# Patient Record
Sex: Male | Born: 1945 | Race: White | Hispanic: No | Marital: Married | Smoking: Former smoker
Health system: Southern US, Community
[De-identification: ages and names within clinical notes are randomized; demographics above are authoritative.]

## PROBLEM LIST (undated history)

## (undated) DIAGNOSIS — R350 Frequency of micturition: Secondary | ICD-10-CM

## (undated) DIAGNOSIS — Z87442 Personal history of urinary calculi: Secondary | ICD-10-CM

## (undated) DIAGNOSIS — I6529 Occlusion and stenosis of unspecified carotid artery: Secondary | ICD-10-CM

## (undated) DIAGNOSIS — I639 Cerebral infarction, unspecified: Secondary | ICD-10-CM

## (undated) DIAGNOSIS — I350 Nonrheumatic aortic (valve) stenosis: Secondary | ICD-10-CM

## (undated) DIAGNOSIS — M1990 Unspecified osteoarthritis, unspecified site: Secondary | ICD-10-CM

## (undated) DIAGNOSIS — F419 Anxiety disorder, unspecified: Secondary | ICD-10-CM

## (undated) DIAGNOSIS — E7800 Pure hypercholesterolemia, unspecified: Secondary | ICD-10-CM

## (undated) DIAGNOSIS — K449 Diaphragmatic hernia without obstruction or gangrene: Secondary | ICD-10-CM

## (undated) DIAGNOSIS — T7840XA Allergy, unspecified, initial encounter: Secondary | ICD-10-CM

## (undated) DIAGNOSIS — K649 Unspecified hemorrhoids: Secondary | ICD-10-CM

## (undated) DIAGNOSIS — F329 Major depressive disorder, single episode, unspecified: Secondary | ICD-10-CM

## (undated) DIAGNOSIS — F32A Depression, unspecified: Secondary | ICD-10-CM

## (undated) DIAGNOSIS — R011 Cardiac murmur, unspecified: Secondary | ICD-10-CM

## (undated) DIAGNOSIS — Z5189 Encounter for other specified aftercare: Secondary | ICD-10-CM

## (undated) DIAGNOSIS — R112 Nausea with vomiting, unspecified: Secondary | ICD-10-CM

## (undated) DIAGNOSIS — R200 Anesthesia of skin: Secondary | ICD-10-CM

## (undated) DIAGNOSIS — Z952 Presence of prosthetic heart valve: Secondary | ICD-10-CM

## (undated) DIAGNOSIS — G459 Transient cerebral ischemic attack, unspecified: Secondary | ICD-10-CM

## (undated) DIAGNOSIS — T8859XA Other complications of anesthesia, initial encounter: Secondary | ICD-10-CM

## (undated) DIAGNOSIS — Z86010 Personal history of colonic polyps: Secondary | ICD-10-CM

## (undated) DIAGNOSIS — I2510 Atherosclerotic heart disease of native coronary artery without angina pectoris: Secondary | ICD-10-CM

## (undated) DIAGNOSIS — Z9889 Other specified postprocedural states: Secondary | ICD-10-CM

## (undated) DIAGNOSIS — R202 Paresthesia of skin: Secondary | ICD-10-CM

## (undated) HISTORY — PX: KNEE SURGERY: SHX244

## (undated) HISTORY — PX: POLYPECTOMY: SHX149

## (undated) HISTORY — DX: Allergy, unspecified, initial encounter: T78.40XA

## (undated) HISTORY — PX: BACK SURGERY: SHX140

## (undated) HISTORY — DX: Personal history of colonic polyps: Z86.010

## (undated) HISTORY — PX: COLONOSCOPY: SHX174

## (undated) HISTORY — DX: Encounter for other specified aftercare: Z51.89

## (undated) HISTORY — PX: TONSILLECTOMY: SUR1361

---

## 2013-11-09 DIAGNOSIS — I639 Cerebral infarction, unspecified: Secondary | ICD-10-CM

## 2013-11-09 HISTORY — DX: Cerebral infarction, unspecified: I63.9

## 2014-01-11 ENCOUNTER — Other Ambulatory Visit: Payer: Self-pay | Admitting: Neurosurgery

## 2014-01-11 DIAGNOSIS — M4712 Other spondylosis with myelopathy, cervical region: Secondary | ICD-10-CM

## 2014-01-22 ENCOUNTER — Ambulatory Visit
Admission: RE | Admit: 2014-01-22 | Discharge: 2014-01-22 | Disposition: A | Discharge status: Home / Self Care | Payer: Medicare Other | Source: Ambulatory Visit | Attending: Neurosurgery | Admitting: Neurosurgery

## 2014-01-22 DIAGNOSIS — M4712 Other spondylosis with myelopathy, cervical region: Secondary | ICD-10-CM

## 2014-02-08 ENCOUNTER — Other Ambulatory Visit: Payer: Self-pay | Admitting: Neurosurgery

## 2014-04-01 ENCOUNTER — Encounter (HOSPITAL_COMMUNITY): Payer: Self-pay

## 2014-04-01 ENCOUNTER — Ambulatory Visit (HOSPITAL_COMMUNITY)
Admission: RE | Admit: 2014-04-01 | Discharge: 2014-04-01 | Disposition: A | Discharge status: Home / Self Care | Payer: Medicare Other | Source: Ambulatory Visit | Attending: Neurosurgery | Admitting: Neurosurgery

## 2014-04-01 ENCOUNTER — Encounter (HOSPITAL_COMMUNITY)
Admission: RE | Admit: 2014-04-01 | Discharge: 2014-04-01 | Disposition: A | Discharge status: Home / Self Care | Payer: Medicare Other | Source: Ambulatory Visit | Attending: Neurosurgery | Admitting: Neurosurgery

## 2014-04-01 DIAGNOSIS — Z01818 Encounter for other preprocedural examination: Secondary | ICD-10-CM | POA: Diagnosis present

## 2014-04-01 HISTORY — DX: Diaphragmatic hernia without obstruction or gangrene: K44.9

## 2014-04-01 HISTORY — DX: Transient cerebral ischemic attack, unspecified: G45.9

## 2014-04-01 HISTORY — DX: Frequency of micturition: R35.0

## 2014-04-01 HISTORY — DX: Nausea with vomiting, unspecified: R11.2

## 2014-04-01 HISTORY — DX: Unspecified hemorrhoids: K64.9

## 2014-04-01 HISTORY — DX: Pure hypercholesterolemia, unspecified: E78.00

## 2014-04-01 HISTORY — DX: Unspecified osteoarthritis, unspecified site: M19.90

## 2014-04-01 HISTORY — DX: Other specified postprocedural states: Z98.890

## 2014-04-01 HISTORY — DX: Paresthesia of skin: R20.0

## 2014-04-01 HISTORY — DX: Anesthesia of skin: R20.2

## 2014-04-01 LAB — SURGICAL PCR SCREEN
MRSA, PCR: NEGATIVE
Staphylococcus aureus: NEGATIVE

## 2014-04-01 LAB — CBC
HCT: 42.9 % (ref 39.0–52.0)
Hemoglobin: 14.3 g/dL (ref 13.0–17.0)
MCH: 32.1 pg (ref 26.0–34.0)
MCHC: 33.3 g/dL (ref 30.0–36.0)
MCV: 96.4 fL (ref 78.0–100.0)
Platelets: 153 10*3/uL (ref 150–400)
RBC: 4.45 MIL/uL (ref 4.22–5.81)
RDW: 13.1 % (ref 11.5–15.5)
WBC: 5 10*3/uL (ref 4.0–10.5)

## 2014-04-01 LAB — BASIC METABOLIC PANEL
Anion gap: 10 (ref 5–15)
BUN: 30 mg/dL — ABNORMAL HIGH (ref 6–23)
CO2: 27 mEq/L (ref 19–32)
Calcium: 9.4 mg/dL (ref 8.4–10.5)
Chloride: 104 mEq/L (ref 96–112)
Creatinine, Ser: 0.8 mg/dL (ref 0.50–1.35)
GFR calc Af Amer: >90 mL/min (ref >90)
GFR calc non Af Amer: >90 mL/min (ref >90)
Glucose, Bld: 129 mg/dL — ABNORMAL HIGH (ref 70–99)
Potassium: 4.5 mEq/L (ref 3.7–5.3)
Sodium: 141 mEq/L (ref 137–147)

## 2014-04-01 NOTE — Pre-Procedure Instructions (Signed)
Nicholas Wise  04/01/2014   Your procedure is scheduled on:  Friday, September 25th  Report to [facility] Admitting at 530 AM.  Call this number if you have problems the morning of surgery: [phone]   Remember:   Do not eat food or drink liquids after midnight.   Take these medicines the morning of surgery with A SIP OF WATER: xanax if needed, flonase, flomax, proscar  Stop taking aspirin, OTC vitamins/herbal medications, NSAIDS (ibuprofen, advil, motrin) 7 days prior to surgery.   Do not wear jewelry.  Do not wear lotions, powders, or perfumes. You may wear deodorant.  Do not shave 48 hours prior to surgery. Men may shave face and neck.  Do not bring valuables to the hospital.  [facility] is not responsible  for any belongings or valuables.               Contacts, dentures or bridgework may not be worn into surgery.  Leave suitcase in the car. After surgery it may be brought to your room.  For patients admitted to the hospital, discharge time is determined by your treatment team.               Patients discharged the day of surgery will not be allowed to drive home.  Please read over the following fact sheets that you were given: Pain Booklet, Coughing and Deep Breathing, MRSA Information and Surgical Site Infection Prevention [facility] - Preparing for Surgery  Before surgery, you can play an important role.  Because skin is not sterile, your skin needs to be as free of germs as possible.  You can reduce the number of germs on you skin by washing with CHG (chlorahexidine gluconate) soap before surgery.  CHG is an antiseptic cleaner which kills germs and bonds with the skin to continue killing germs even after washing.  Please DO NOT use if you have an allergy to CHG or antibacterial soaps.  If your skin becomes reddened/irritated stop using the CHG and inform your nurse when you arrive at Short Stay.  Do not shave (including legs and underarms) for at least 48 hours  prior to the first CHG shower.  You may shave your face.  Please follow these instructions carefully:   1.  Shower with CHG Soap the night before surgery and the morning of Surgery.  2.  If you choose to wash your hair, wash your hair first as usual with your normal shampoo.  3.  After you shampoo, rinse your hair and body thoroughly to remove the shampoo.  4.  Use CHG as you would any other liquid soap.  You can apply CHG directly to the skin and wash gently with scrungie or a clean washcloth.  5.  Apply the CHG Soap to your body ONLY FROM THE NECK DOWN.  Do not use on open wounds or open sores.  Avoid contact with your eyes, ears, mouth and genitals (private parts).  Wash genitals (private parts) with your normal soap.  6.  Wash thoroughly, paying special attention to the area where your surgery will be performed.  7.  Thoroughly rinse your body with warm water from the neck down.  8.  DO NOT shower/wash with your normal soap after using and rinsing off the CHG Soap.  9.  Pat yourself dry with a clean towel.            10.  Wear clean pajamas.  11.  Place clean sheets on your bed the night of your first shower and do not sleep with pets.  Day of Surgery  Do not apply any lotions/deoderants the morning of surgery.  Please wear clean clothes to the hospital/surgery center.

## 2014-04-04 NOTE — Progress Notes (Signed)
Anesthesia PAT Evaluation:  Patient is a 68 year old male scheduled for C3-4, C4-5, C5-6 ACDF on 04/08/14 by Dr. Vertell Limber.  I evaluated him during his PAT visit on 04/01/14 due to history of TIA earlier this year.  History includes TIA 11/09/2013 (presented with transient left facial and LUE numbness and facial droop; evaluated at [facility] in [place], [place]), hypercholesterolemia, hiatal hernia, arthritis, postoperative nausea and vomiting, tonsillectomy. Smoking status was not documented.  He has smoked in the past. PCP is Dr. Macarthur Critchley with IM in [place].  Neurologist is Dr. Wendi Snipes in [place] who did clear patient to hold Plavix for surgery.  He is not currently being followed by a cardiologist, but had a remote history of a stress test (> 15 years) with Dr. Sabra Heck at [facility], and was told symptoms were musculoskeletal related. 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  Echo on 11/10/13 [facility]) showed:  Normal LV size, wall thickness, and systolic function with an EF estimated at 60%. Normal right ventricle size and systolic function. Grossly normal left ventricular filling pattern with indeterminate resting left ventricle filling pressures by tissue Doppler. Aortic valve is diffusely calcified and borderline stenosed. The maximum left ventricular outlet velocities recorded at about 2.2 m/s. Mitral, tricuspid, and pulmonic valves structurally grossly normal. Nonspecific thickening is observed of the intra-atrial septum, likely related to lipomatous infiltration. No pericardial effusion. No intracardiac mass/thrombus.  EKG on 11/09/13 [facility]) showed: SR, RSR in V1/2 consistent with RV conduction delay. EKG on 11/10/13 showed SB at 57 bpm, non-specific IVCD.  Carotid duplex on 11/10/13 [facility]) showed: Less than 50% bilateral ICA stenosis. Bilateral vertebral arteries demonstrate antegrade flow.  Szymczak CT w/o contrast on 11/09/13 showed: No acute intracranial findings. (He  said that he tried an out-patient MRI, but this had to be aborted due to claustrophobia.  He reports that at this time, Dr. Rob Hickman is not planning to re-order.)  Preoperative CXR and labs noted.    He denied any recurrent TIA/CVA symptoms.  No chest pain or SOB.  No LE edema.  Heart RRR, question of faint I/VI SEM. Lungs clear.  Good mouth opening.  Mild limitations with neck extension. We discussed possibility of need for specialized equipment for intubation such as glidescope.  He will discuss further with his assigned anesthesiologist on the day of surgery. If no acute changes then I anticipate that he can proceed.  George Hugh [facility] Short Stay Center/Anesthesiology Phone [phone] 04/04/2014 1:42 PM

## 2014-04-10 MED ORDER — CEFAZOLIN SODIUM-DEXTROSE 2-3 GM-% IV SOLR: 2.0000 g | INTRAVENOUS | Status: DC

## 2014-04-11 ENCOUNTER — Encounter (HOSPITAL_COMMUNITY): Payer: Self-pay | Admitting: Certified Registered Nurse Anesthetist

## 2014-04-11 ENCOUNTER — Encounter (HOSPITAL_COMMUNITY): Payer: Medicare Other | Admitting: Vascular Surgery

## 2014-04-11 ENCOUNTER — Inpatient Hospital Stay (HOSPITAL_COMMUNITY): Payer: Medicare Other

## 2014-04-11 ENCOUNTER — Encounter (HOSPITAL_COMMUNITY)
Admission: RE | Disposition: A | Discharge status: Home / Self Care | Payer: Self-pay | Source: Ambulatory Visit | Attending: Neurosurgery

## 2014-04-11 ENCOUNTER — Inpatient Hospital Stay (HOSPITAL_COMMUNITY)
Admission: RE | Admit: 2014-04-11 | Discharge: 2014-04-12 | DRG: 473 | Disposition: A | Discharge status: Home / Self Care | Payer: Medicare Other | Source: Ambulatory Visit | Attending: Neurosurgery | Admitting: Neurosurgery

## 2014-04-11 ENCOUNTER — Inpatient Hospital Stay (HOSPITAL_COMMUNITY): Payer: Medicare Other | Admitting: Anesthesiology

## 2014-04-11 DIAGNOSIS — Z7982 Long term (current) use of aspirin: Secondary | ICD-10-CM | POA: Diagnosis not present

## 2014-04-11 DIAGNOSIS — M4712 Other spondylosis with myelopathy, cervical region: Secondary | ICD-10-CM | POA: Diagnosis present

## 2014-04-11 DIAGNOSIS — Z833 Family history of diabetes mellitus: Secondary | ICD-10-CM

## 2014-04-11 DIAGNOSIS — M542 Cervicalgia: Secondary | ICD-10-CM | POA: Diagnosis present

## 2014-04-11 DIAGNOSIS — R338 Other retention of urine: Secondary | ICD-10-CM | POA: Diagnosis not present

## 2014-04-11 DIAGNOSIS — M5380 Other specified dorsopathies, site unspecified: Secondary | ICD-10-CM | POA: Diagnosis present

## 2014-04-11 DIAGNOSIS — G959 Disease of spinal cord, unspecified: Secondary | ICD-10-CM | POA: Diagnosis present

## 2014-04-11 DIAGNOSIS — M5412 Radiculopathy, cervical region: Secondary | ICD-10-CM | POA: Diagnosis present

## 2014-04-11 DIAGNOSIS — Z7902 Long term (current) use of antithrombotics/antiplatelets: Secondary | ICD-10-CM

## 2014-04-11 DIAGNOSIS — Z8249 Family history of ischemic heart disease and other diseases of the circulatory system: Secondary | ICD-10-CM | POA: Diagnosis not present

## 2014-04-11 HISTORY — PX: ANTERIOR CERVICAL DECOMP/DISCECTOMY FUSION: SHX1161

## 2014-04-11 SURGERY — ANTERIOR CERVICAL DECOMPRESSION/DISCECTOMY FUSION 3 LEVELS
Anesthesia: General

## 2014-04-11 MED ORDER — TAMSULOSIN HCL 0.4 MG PO CAPS
0.4000 mg | ORAL_CAPSULE | Freq: Two times a day (BID) | ORAL | Status: DC
  Administered 2014-04-11 – 2014-04-12 (×2): 0.4 mg via ORAL
  Filled 2014-04-11 (×4): qty 1

## 2014-04-11 MED ORDER — ALLOPURINOL 300 MG PO TABS
300.0000 mg | ORAL_TABLET | Freq: Every day | ORAL | Status: DC
  Administered 2014-04-11 – 2014-04-12 (×2): 300 mg via ORAL
  Filled 2014-04-11 (×2): qty 1

## 2014-04-11 MED ORDER — FENTANYL CITRATE 0.05 MG/ML IJ SOLN
INTRAMUSCULAR | Status: AC
  Filled 2014-04-11: qty 5

## 2014-04-11 MED ORDER — MORPHINE SULFATE 2 MG/ML IJ SOLN: 1.0000 mg | INTRAMUSCULAR | Status: DC | PRN

## 2014-04-11 MED ORDER — PROPOFOL 10 MG/ML IV BOLUS
INTRAVENOUS | Status: AC
  Filled 2014-04-11: qty 20

## 2014-04-11 MED ORDER — DEXTROSE 5 % IV SOLN
1.5000 g | INTRAVENOUS | Status: DC | PRN
  Administered 2014-04-11: 1.5 g via INTRAVENOUS

## 2014-04-11 MED ORDER — STERILE WATER FOR INJECTION IJ SOLN
INTRAMUSCULAR | Status: AC
  Filled 2014-04-11: qty 10

## 2014-04-11 MED ORDER — HYDROMORPHONE HCL 1 MG/ML IJ SOLN
INTRAMUSCULAR | Status: AC
  Filled 2014-04-11: qty 1

## 2014-04-11 MED ORDER — DOCUSATE SODIUM 100 MG PO CAPS
100.0000 mg | ORAL_CAPSULE | Freq: Two times a day (BID) | ORAL | Status: DC
  Administered 2014-04-11 – 2014-04-12 (×3): 100 mg via ORAL
  Filled 2014-04-11 (×4): qty 1

## 2014-04-11 MED ORDER — HYDROMORPHONE HCL 1 MG/ML IJ SOLN
0.2500 mg | INTRAMUSCULAR | Status: DC | PRN
  Administered 2014-04-11 (×4): 0.5 mg via INTRAVENOUS

## 2014-04-11 MED ORDER — DIPHENHYDRAMINE HCL 50 MG/ML IJ SOLN
INTRAMUSCULAR | Status: DC | PRN
  Administered 2014-04-11: 12.5 mg via INTRAVENOUS

## 2014-04-11 MED ORDER — BUPIVACAINE HCL (PF) 0.5 % IJ SOLN
INTRAMUSCULAR | Status: DC | PRN
  Administered 2014-04-11: 4 mL

## 2014-04-11 MED ORDER — ALPRAZOLAM 0.25 MG PO TABS: 0.2500 mg | ORAL_TABLET | Freq: Every day | ORAL | Status: DC | PRN

## 2014-04-11 MED ORDER — SENNA 8.6 MG PO TABS
1.0000 | ORAL_TABLET | Freq: Two times a day (BID) | ORAL | Status: DC
  Administered 2014-04-11 – 2014-04-12 (×2): 8.6 mg via ORAL
  Filled 2014-04-11 (×3): qty 1

## 2014-04-11 MED ORDER — MIDAZOLAM HCL 5 MG/5ML IJ SOLN
INTRAMUSCULAR | Status: DC | PRN
  Administered 2014-04-11: 2 mg via INTRAVENOUS

## 2014-04-11 MED ORDER — SUCCINYLCHOLINE CHLORIDE 20 MG/ML IJ SOLN
INTRAMUSCULAR | Status: AC
  Filled 2014-04-11: qty 1

## 2014-04-11 MED ORDER — LIDOCAINE-EPINEPHRINE 1 %-1:100000 IJ SOLN
INTRAMUSCULAR | Status: DC | PRN
  Administered 2014-04-11: 4 mL

## 2014-04-11 MED ORDER — ACETAMINOPHEN 325 MG PO TABS: 650.0000 mg | ORAL_TABLET | ORAL | Status: DC | PRN

## 2014-04-11 MED ORDER — SODIUM CHLORIDE 0.9 % IJ SOLN: 3.0000 mL | INTRAMUSCULAR | Status: DC | PRN

## 2014-04-11 MED ORDER — SODIUM CHLORIDE 0.9 % IJ SOLN
3.0000 mL | Freq: Two times a day (BID) | INTRAMUSCULAR | Status: DC
  Administered 2014-04-11 (×2): 3 mL via INTRAVENOUS

## 2014-04-11 MED ORDER — FLEET ENEMA 7-19 GM/118ML RE ENEM
1.0000 | ENEMA | Freq: Once | RECTAL | Status: AC | PRN
  Filled 2014-04-11: qty 1

## 2014-04-11 MED ORDER — ARTIFICIAL TEARS OP OINT
TOPICAL_OINTMENT | OPHTHALMIC | Status: AC
  Filled 2014-04-11: qty 3.5

## 2014-04-11 MED ORDER — NEOSTIGMINE METHYLSULFATE 10 MG/10ML IV SOLN
INTRAVENOUS | Status: DC | PRN
  Administered 2014-04-11: 4 mg via INTRAVENOUS

## 2014-04-11 MED ORDER — PHENOL 1.4 % MT LIQD: 1.0000 | OROMUCOSAL | Status: DC | PRN

## 2014-04-11 MED ORDER — ROCURONIUM BROMIDE 100 MG/10ML IV SOLN
INTRAVENOUS | Status: DC | PRN
  Administered 2014-04-11: 50 mg via INTRAVENOUS

## 2014-04-11 MED ORDER — MENTHOL 3 MG MT LOZG
1.0000 | LOZENGE | OROMUCOSAL | Status: DC | PRN
  Filled 2014-04-11: qty 9

## 2014-04-11 MED ORDER — ZOLPIDEM TARTRATE 5 MG PO TABS: 5.0000 mg | ORAL_TABLET | Freq: Every evening | ORAL | Status: DC | PRN

## 2014-04-11 MED ORDER — VECURONIUM BROMIDE 10 MG IV SOLR
INTRAVENOUS | Status: AC
  Filled 2014-04-11: qty 10

## 2014-04-11 MED ORDER — FINASTERIDE 5 MG PO TABS
5.0000 mg | ORAL_TABLET | Freq: Every day | ORAL | Status: DC
  Administered 2014-04-12: 5 mg via ORAL
  Filled 2014-04-11 (×2): qty 1

## 2014-04-11 MED ORDER — THROMBIN 20000 UNITS EX SOLR
CUTANEOUS | Status: DC | PRN
  Administered 2014-04-11: 08:00:00 via TOPICAL

## 2014-04-11 MED ORDER — ATORVASTATIN CALCIUM 40 MG PO TABS
40.0000 mg | ORAL_TABLET | Freq: Every day | ORAL | Status: DC
  Administered 2014-04-11 – 2014-04-12 (×2): 40 mg via ORAL
  Filled 2014-04-11 (×2): qty 1

## 2014-04-11 MED ORDER — PANTOPRAZOLE SODIUM 40 MG IV SOLR
40.0000 mg | Freq: Every day | INTRAVENOUS | Status: DC
  Administered 2014-04-11: 40 mg via INTRAVENOUS
  Filled 2014-04-11 (×2): qty 40

## 2014-04-11 MED ORDER — PHENYLEPHRINE 40 MCG/ML (10ML) SYRINGE FOR IV PUSH (FOR BLOOD PRESSURE SUPPORT)
PREFILLED_SYRINGE | INTRAVENOUS | Status: AC
  Filled 2014-04-11: qty 10

## 2014-04-11 MED ORDER — ASPIRIN EC 81 MG PO TBEC
81.0000 mg | DELAYED_RELEASE_TABLET | Freq: Every day | ORAL | Status: DC
  Administered 2014-04-11 – 2014-04-12 (×2): 81 mg via ORAL
  Filled 2014-04-11 (×2): qty 1

## 2014-04-11 MED ORDER — LACTATED RINGERS IV SOLN
INTRAVENOUS | Status: DC | PRN
  Administered 2014-04-11 (×2): via INTRAVENOUS

## 2014-04-11 MED ORDER — CEFAZOLIN SODIUM 1-5 GM-% IV SOLN
1.0000 g | Freq: Three times a day (TID) | INTRAVENOUS | Status: AC
  Administered 2014-04-11 (×2): 1 g via INTRAVENOUS
  Filled 2014-04-11 (×2): qty 50

## 2014-04-11 MED ORDER — FENTANYL CITRATE 0.05 MG/ML IJ SOLN
INTRAMUSCULAR | Status: DC | PRN
  Administered 2014-04-11: 200 ug via INTRAVENOUS
  Administered 2014-04-11: 50 ug via INTRAVENOUS

## 2014-04-11 MED ORDER — OXYCODONE-ACETAMINOPHEN 5-325 MG PO TABS: 1.0000 | ORAL_TABLET | ORAL | Status: DC | PRN

## 2014-04-11 MED ORDER — HYDROCODONE-ACETAMINOPHEN 5-325 MG PO TABS
1.0000 | ORAL_TABLET | ORAL | Status: DC | PRN
  Administered 2014-04-11 (×2): 1 via ORAL
  Administered 2014-04-12 (×2): 2 via ORAL
  Filled 2014-04-11: qty 1
  Filled 2014-04-11 (×2): qty 2
  Filled 2014-04-11: qty 1

## 2014-04-11 MED ORDER — 0.9 % SODIUM CHLORIDE (POUR BTL) OPTIME
TOPICAL | Status: DC | PRN
  Administered 2014-04-11: 1000 mL

## 2014-04-11 MED ORDER — EPHEDRINE SULFATE 50 MG/ML IJ SOLN
INTRAMUSCULAR | Status: AC
  Filled 2014-04-11: qty 1

## 2014-04-11 MED ORDER — KCL IN DEXTROSE-NACL 20-5-0.45 MEQ/L-%-% IV SOLN
INTRAVENOUS | Status: DC
  Filled 2014-04-11 (×3): qty 1000

## 2014-04-11 MED ORDER — DIAZEPAM 5 MG PO TABS
ORAL_TABLET | ORAL | Status: AC
  Filled 2014-04-11: qty 1

## 2014-04-11 MED ORDER — ONDANSETRON HCL 4 MG/2ML IJ SOLN
4.0000 mg | INTRAMUSCULAR | Status: DC | PRN
  Administered 2014-04-11: 4 mg via INTRAVENOUS
  Filled 2014-04-11: qty 2

## 2014-04-11 MED ORDER — ONDANSETRON HCL 4 MG/2ML IJ SOLN
INTRAMUSCULAR | Status: DC | PRN
  Administered 2014-04-11: 4 mg via INTRAVENOUS

## 2014-04-11 MED ORDER — OXYCODONE HCL 5 MG/5ML PO SOLN: 5.0000 mg | Freq: Once | ORAL | Status: AC | PRN

## 2014-04-11 MED ORDER — BISACODYL 10 MG RE SUPP: 10.0000 mg | Freq: Every day | RECTAL | Status: DC | PRN

## 2014-04-11 MED ORDER — NEOSTIGMINE METHYLSULFATE 10 MG/10ML IV SOLN
INTRAVENOUS | Status: AC
  Filled 2014-04-11: qty 1

## 2014-04-11 MED ORDER — LORATADINE 10 MG PO TABS
10.0000 mg | ORAL_TABLET | Freq: Every day | ORAL | Status: DC
  Administered 2014-04-11 – 2014-04-12 (×2): 10 mg via ORAL
  Filled 2014-04-11 (×2): qty 1

## 2014-04-11 MED ORDER — OXYCODONE HCL 5 MG PO TABS
ORAL_TABLET | ORAL | Status: AC
  Filled 2014-04-11: qty 1

## 2014-04-11 MED ORDER — GLYCOPYRROLATE 0.2 MG/ML IJ SOLN
INTRAMUSCULAR | Status: DC | PRN
  Administered 2014-04-11: 0.6 mg via INTRAVENOUS

## 2014-04-11 MED ORDER — ALUM & MAG HYDROXIDE-SIMETH 200-200-20 MG/5ML PO SUSP: 30.0000 mL | Freq: Four times a day (QID) | ORAL | Status: DC | PRN

## 2014-04-11 MED ORDER — MIDAZOLAM HCL 2 MG/2ML IJ SOLN
INTRAMUSCULAR | Status: AC
  Filled 2014-04-11: qty 2

## 2014-04-11 MED ORDER — PROPOFOL 10 MG/ML IV BOLUS
INTRAVENOUS | Status: DC | PRN
  Administered 2014-04-11: 170 mg via INTRAVENOUS

## 2014-04-11 MED ORDER — SENNOSIDES-DOCUSATE SODIUM 8.6-50 MG PO TABS
1.0000 | ORAL_TABLET | Freq: Every evening | ORAL | Status: DC | PRN
  Filled 2014-04-11: qty 1

## 2014-04-11 MED ORDER — SCOPOLAMINE 1 MG/3DAYS TD PT72
MEDICATED_PATCH | TRANSDERMAL | Status: DC | PRN
  Administered 2014-04-11: 1 via TRANSDERMAL

## 2014-04-11 MED ORDER — DEXAMETHASONE SODIUM PHOSPHATE 4 MG/ML IJ SOLN
INTRAMUSCULAR | Status: DC | PRN
  Administered 2014-04-11: 4 mg via INTRAVENOUS
  Administered 2014-04-11: 6 mg via INTRAVENOUS

## 2014-04-11 MED ORDER — ACETAMINOPHEN 650 MG RE SUPP: 650.0000 mg | RECTAL | Status: DC | PRN

## 2014-04-11 MED ORDER — PHENYLEPHRINE HCL 10 MG/ML IJ SOLN
INTRAMUSCULAR | Status: DC | PRN
  Administered 2014-04-11 (×2): 80 ug via INTRAVENOUS

## 2014-04-11 MED ORDER — METOCLOPRAMIDE HCL 5 MG/ML IJ SOLN: 10.0000 mg | Freq: Once | INTRAMUSCULAR | Status: DC | PRN

## 2014-04-11 MED ORDER — FLUTICASONE PROPIONATE 50 MCG/ACT NA SUSP
1.0000 | Freq: Two times a day (BID) | NASAL | Status: DC
  Filled 2014-04-11: qty 16

## 2014-04-11 MED ORDER — ONDANSETRON HCL 4 MG/2ML IJ SOLN
INTRAMUSCULAR | Status: AC
  Filled 2014-04-11: qty 2

## 2014-04-11 MED ORDER — POTASSIUM CITRATE ER 10 MEQ (1080 MG) PO TBCR
10.0000 meq | EXTENDED_RELEASE_TABLET | Freq: Two times a day (BID) | ORAL | Status: DC
  Administered 2014-04-11 – 2014-04-12 (×3): 10 meq via ORAL
  Filled 2014-04-11 (×5): qty 1

## 2014-04-11 MED ORDER — GLYCOPYRROLATE 0.2 MG/ML IJ SOLN
INTRAMUSCULAR | Status: AC
  Filled 2014-04-11: qty 3

## 2014-04-11 MED ORDER — ROCURONIUM BROMIDE 50 MG/5ML IV SOLN
INTRAVENOUS | Status: AC
  Filled 2014-04-11: qty 1

## 2014-04-11 MED ORDER — LIDOCAINE HCL (CARDIAC) 20 MG/ML IV SOLN
INTRAVENOUS | Status: DC | PRN
  Administered 2014-04-11: 60 mg via INTRATRACHEAL
  Administered 2014-04-11: 40 mg via INTRAVENOUS

## 2014-04-11 MED ORDER — OXYCODONE HCL 5 MG PO TABS
5.0000 mg | ORAL_TABLET | Freq: Once | ORAL | Status: AC | PRN
  Administered 2014-04-11: 5 mg via ORAL

## 2014-04-11 MED ORDER — EPHEDRINE SULFATE 50 MG/ML IJ SOLN
INTRAMUSCULAR | Status: DC | PRN
  Administered 2014-04-11 (×2): 10 mg via INTRAVENOUS

## 2014-04-11 MED ORDER — LIDOCAINE HCL (CARDIAC) 20 MG/ML IV SOLN
INTRAVENOUS | Status: AC
  Filled 2014-04-11: qty 10

## 2014-04-11 MED ORDER — DIAZEPAM 5 MG PO TABS
5.0000 mg | ORAL_TABLET | Freq: Four times a day (QID) | ORAL | Status: DC | PRN
  Administered 2014-04-11 – 2014-04-12 (×3): 5 mg via ORAL
  Filled 2014-04-11 (×2): qty 1

## 2014-04-11 MED ORDER — CLOPIDOGREL BISULFATE 75 MG PO TABS
75.0000 mg | ORAL_TABLET | Freq: Every day | ORAL | Status: DC
  Filled 2014-04-11: qty 1

## 2014-04-11 SURGICAL SUPPLY — 70 items
ALLOGRAFT LORDOTIC CC 7X11X14 (Bone Implant) ×4 IMPLANT
ALLOGRAFT TRIAD LORDOTIC CC (Bone Implant) ×2 IMPLANT
BAG DECANTER FOR FLEXI CONT (MISCELLANEOUS) ×2 IMPLANT
BENZOIN TINCTURE PRP APPL 2/3 (GAUZE/BANDAGES/DRESSINGS) IMPLANT
BIT DRILL NEURO 2X3.1 SFT TUCH (MISCELLANEOUS) ×1 IMPLANT
BIT DRILL POWER (BIT) ×1 IMPLANT
BLADE ULTRA TIP 2M (BLADE) IMPLANT
BNDG GAUZE ELAST 4 BULKY (GAUZE/BANDAGES/DRESSINGS) IMPLANT
BUR BARREL STRAIGHT FLUTE 4.0 (BURR) ×4 IMPLANT
CANISTER SUCT 3000ML (MISCELLANEOUS) ×2 IMPLANT
CONT SPEC 4OZ CLIKSEAL STRL BL (MISCELLANEOUS) ×2 IMPLANT
COVER MAYO STAND STRL (DRAPES) ×2 IMPLANT
DERMABOND ADVANCED (GAUZE/BANDAGES/DRESSINGS) ×1
DERMABOND ADVANCED .7 DNX12 (GAUZE/BANDAGES/DRESSINGS) ×1 IMPLANT
DRAPE LAPAROTOMY 100X72 PEDS (DRAPES) ×2 IMPLANT
DRAPE MICROSCOPE LEICA (MISCELLANEOUS) ×2 IMPLANT
DRAPE POUCH INSTRU U-SHP 10X18 (DRAPES) ×2 IMPLANT
DRAPE PROXIMA HALF (DRAPES) IMPLANT
DRILL BIT POWER (BIT) ×1
DRILL NEURO 2X3.1 SOFT TOUCH (MISCELLANEOUS) ×2
DRSG OPSITE POSTOP 4X6 (GAUZE/BANDAGES/DRESSINGS) ×2 IMPLANT
DRSG TELFA 3X8 NADH (GAUZE/BANDAGES/DRESSINGS) IMPLANT
DURAPREP 6ML APPLICATOR 50/CS (WOUND CARE) ×2 IMPLANT
ELECT COATED BLADE 2.86 ST (ELECTRODE) ×2 IMPLANT
ELECT REM PT RETURN 9FT ADLT (ELECTROSURGICAL) ×2
ELECTRODE REM PT RTRN 9FT ADLT (ELECTROSURGICAL) ×1 IMPLANT
GAUZE SPONGE 4X4 12PLY STRL (GAUZE/BANDAGES/DRESSINGS) IMPLANT
GAUZE SPONGE 4X4 16PLY XRAY LF (GAUZE/BANDAGES/DRESSINGS) IMPLANT
GLOVE BIO SURGEON STRL SZ8 (GLOVE) ×2 IMPLANT
GLOVE BIOGEL PI IND STRL 8 (GLOVE) ×1 IMPLANT
GLOVE BIOGEL PI IND STRL 8.5 (GLOVE) ×1 IMPLANT
GLOVE BIOGEL PI INDICATOR 8 (GLOVE) ×1
GLOVE BIOGEL PI INDICATOR 8.5 (GLOVE) ×1
GLOVE ECLIPSE 8.0 STRL XLNG CF (GLOVE) ×2 IMPLANT
GLOVE EXAM NITRILE LRG STRL (GLOVE) ×4 IMPLANT
GLOVE EXAM NITRILE MD LF STRL (GLOVE) IMPLANT
GLOVE EXAM NITRILE XL STR (GLOVE) IMPLANT
GLOVE EXAM NITRILE XS STR PU (GLOVE) IMPLANT
GLOVE INDICATOR 7.0 STRL GRN (GLOVE) ×2 IMPLANT
GLOVE OPTIFIT SS 6.0 STRL BRWN (GLOVE) ×6 IMPLANT
GOWN STRL REUS W/ TWL LRG LVL3 (GOWN DISPOSABLE) ×1 IMPLANT
GOWN STRL REUS W/ TWL XL LVL3 (GOWN DISPOSABLE) ×2 IMPLANT
GOWN STRL REUS W/TWL 2XL LVL3 (GOWN DISPOSABLE) ×2 IMPLANT
GOWN STRL REUS W/TWL LRG LVL3 (GOWN DISPOSABLE) ×1
GOWN STRL REUS W/TWL XL LVL3 (GOWN DISPOSABLE) ×2
HALTER HD/CHIN CERV TRACTION D (MISCELLANEOUS) ×2 IMPLANT
KIT BASIN OR (CUSTOM PROCEDURE TRAY) ×2 IMPLANT
KIT ROOM TURNOVER OR (KITS) ×2 IMPLANT
NEEDLE HYPO 25X1 1.5 SAFETY (NEEDLE) ×2 IMPLANT
NEEDLE SPNL 18GX3.5 QUINCKE PK (NEEDLE) ×2 IMPLANT
NEEDLE SPNL 22GX3.5 QUINCKE BK (NEEDLE) ×2 IMPLANT
NS IRRIG 1000ML POUR BTL (IV SOLUTION) ×2 IMPLANT
PACK LAMINECTOMY NEURO (CUSTOM PROCEDURE TRAY) ×2 IMPLANT
PAD ARMBOARD 7.5X6 YLW CONV (MISCELLANEOUS) ×6 IMPLANT
PIN DISTRACTION 14MM (PIN) ×4 IMPLANT
PLATE ARCHON 3-LEVEL 60MM (Plate) ×2 IMPLANT
RUBBERBAND STERILE (MISCELLANEOUS) ×4 IMPLANT
SCREW ARCHON SELFTAP 4.0X13 (Screw) ×16 IMPLANT
SPONGE INTESTINAL PEANUT (DISPOSABLE) ×2 IMPLANT
SPONGE SURGIFOAM ABS GEL 100 (HEMOSTASIS) ×2 IMPLANT
STAPLER SKIN PROX WIDE 3.9 (STAPLE) ×2 IMPLANT
STRIP CLOSURE SKIN 1/2X4 (GAUZE/BANDAGES/DRESSINGS) IMPLANT
SUT VIC AB 3-0 SH 8-18 (SUTURE) ×2 IMPLANT
SYR 20ML ECCENTRIC (SYRINGE) ×2 IMPLANT
SYR 5ML LL (SYRINGE) IMPLANT
TOWEL OR 17X24 6PK STRL BLUE (TOWEL DISPOSABLE) ×2 IMPLANT
TOWEL OR 17X26 10 PK STRL BLUE (TOWEL DISPOSABLE) ×2 IMPLANT
TRAP SPECIMEN MUCOUS 40CC (MISCELLANEOUS) IMPLANT
TRAY FOLEY CATH 14FRSI W/METER (CATHETERS) IMPLANT
WATER STERILE IRR 1000ML POUR (IV SOLUTION) ×2 IMPLANT

## 2014-04-11 NOTE — Progress Notes (Signed)
Awake, alert, conversant.  MAEW with good strength bilateral deltoids, biceps, triceps, hand intrinsics.  Sore in neck, otherwise no complaints.

## 2014-04-11 NOTE — Op Note (Signed)
04/11/2014  10:21 AM  PATIENT:  Nicholas Wise  68 y.o. 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  PROCEDURE:  Procedure(s): ANTERIOR CERVICAL DECOMPRESSION/DISCECTOMY FUSION CERVICAL THREE-FOUR,CERVICAL FOUR-FIVE,CERVICAL FIVE-SIX (N/A) with allograft, autograft, plate  SURGEON:  Surgeon(s) and Role:    * Erline Levine, MD - Primary    * Newman Pies, MD - Assisting  PHYSICIAN ASSISTANT:   ASSISTANTS: Poteat, RN   ANESTHESIA:   general  EBL:  Total I/O In: 1000 [I.V.:1000] Out: 50 [Blood:50]  BLOOD ADMINISTERED:none  DRAINS: none   LOCAL MEDICATIONS USED:  LIDOCAINE   SPECIMEN:  No Specimen  DISPOSITION OF SPECIMEN:  N/A  COUNTS:  YES  TOURNIQUET:  * No tourniquets in log *  DICTATION: Patient was brought to operating room and following the smooth and uncomplicated induction of general endotracheal anesthesia his Lambert was placed on a horseshoe Millette holder he was placed in 5 pounds of Holter traction and his anterior neck was prepped and draped in usual sterile fashion. An incision was made on the left side of midline after infiltrating the skin and subcutaneous tissues with local lidocaine. The platysmal layer was incised and subplatysmal dissection was performed exposing the anterior border sternocleidomastoid muscle. Using blunt dissection the carotid sheath was kept lateral and trachea and esophagus kept medial exposing the anterior cervical spine. A bent spinal needle was placed it was felt to be the C4-5  level and this was confirmed on intraoperative x-ray. Longus coli muscles were taken down from the anterior cervical spine using electrocautery and key elevator and self-retaining retractor was placed. The interspace at C 34 was incised  and a thorough discectomy was performed. Distraction pins were placed. Uncinate spurs and central spondylitic ridges were drilled down with a high-speed drill. The spinal cord dura and both C 4 nerve roots were widely decompressed. Hemostasis was assured. After trial sizing a 7 mm machined lordotic cortico-cancellous allograft was selected and packed with local autograft. The graft was tamped into position and countersunk appropriately. The retractor was moved and the interspace at C 45 was incised and a thorough discectomy was performed. A large osteophyte was removed on the right with decompression of the right C 5 nerve root.  Distraction pins were placed. Uncinate spurs and central spondylitic ridges were drilled down with a high-speed drill. The spinal cord dura and both C5 nerve roots were widely decompressed. Hemostasis was assured. After trial sizing a 6 mm similar sized graft was selected, packed and inserted in the interspace in a similar fashion. The interspace at C 56 was incised and a thorough discectomy was performed. Distraction pins were placed. Uncinate spurs and central spondylitic ridges were drilled down with a high-speed drill. The spinal cord dura and both C 6 nerve roots were widely decompressed. A large osteophyte was removed on the right with decompression of the right C 6 nerve root. Hemostasis was assured. After trial sizing a 7 mm similar graft was prepared, packed and inserted in a similar fashion.  Distraction weight was removed. A 60 mm Nuvasive Archon anterior cervical plate was affixed to the cervical spine with 13 mm variable-angle screws 2 at C3, 2 at C4, 2 at C5,  and 2 at C6. All screws were well-positioned and locking mechanisms were engaged. Soft tissues were  inspected and found to be in good repair. The wound was irrigated. A final x-ray was obtained with good visualization at all operated levels with the interbody grafts well visualized. The platysma layer was closed with 3-0  Vicryl stitches and the skin was reapproximated with 3-0 Vicryl subcuticular stitches. The wound was dressed with Dermabond and an occlusive dressing. Counts were correct at the end of the case. Patient was extubated and taken to recovery in stable and satisfactory condition.    PLAN OF CARE: Admit to inpatient   PATIENT DISPOSITION:  PACU - hemodynamically stable.   Delay start of Pharmacological VTE agent (>24hrs) due to surgical blood loss or risk of bleeding: yes

## 2014-04-11 NOTE — Anesthesia Preprocedure Evaluation (Signed)
Anesthesia Evaluation  Patient identified by MRN, date of birth, ID band Patient awake    Reviewed: Allergy & Precautions, H&P , NPO status , Patient's Chart, lab work & pertinent test results  History of Anesthesia Complications (+) history of anesthetic complications  Airway Mallampati: II TM Distance: >3 FB Neck ROM: Limited    Dental  (+) Teeth Intact, Dental Advisory Given   Pulmonary  breath sounds clear to auscultation        Cardiovascular Rhythm:Regular Rate:Normal     Neuro/Psych    GI/Hepatic   Endo/Other    Renal/GU      Musculoskeletal   Abdominal   Peds  Hematology   Anesthesia Other Findings   Reproductive/Obstetrics                           Anesthesia Physical Anesthesia Plan  ASA: III  Anesthesia Plan: General   Post-op Pain Management:    Induction: Intravenous  Airway Management Planned: Oral ETT  Additional Equipment:   Intra-op Plan:   Post-operative Plan: Extubation in OR  Informed Consent: I have reviewed the patients History and Physical, chart, labs and discussed the procedure including the risks, benefits and alternatives for the proposed anesthesia with the patient or authorized representative who has indicated his/her understanding and acceptance.   Dental advisory given  Plan Discussed with: CRNA and Anesthesiologist  Anesthesia Plan Comments: (Cervical spondylosis H/O TIA 11/09/2013 No residual Carotid dopplers, ECHO (-) Off plavix X 10 days H/O Post-op N/V  Plan GA with oral ETT  Roberts Gaudy, MD)        Anesthesia Quick Evaluation

## 2014-04-11 NOTE — Anesthesia Procedure Notes (Signed)
Procedure Name: Intubation Date/Time: 04/11/2014 7:57 AM Performed by: Maryland Pink Pre-anesthesia Checklist: Patient identified, Emergency Drugs available, Suction available, Patient being monitored and Timeout performed Patient Re-evaluated:Patient Re-evaluated prior to inductionOxygen Delivery Method: Circle system utilized Preoxygenation: Pre-oxygenation with 100% oxygen Intubation Type: IV induction Ventilation: Mask ventilation without difficulty and Oral airway inserted - appropriate to patient size Laryngoscope Size: Mac and 3 Grade View: Grade I Tube type: Oral Tube size: 7.5 mm Number of attempts: 1 Airway Equipment and Method: LTA kit utilized and Stylet Placement Confirmation: ETT inserted through vocal cords under direct vision,  positive ETCO2 and breath sounds checked- equal and bilateral Secured at: 22 cm Tube secured with: Tape Dental Injury: Teeth and Oropharynx as per pre-operative assessment

## 2014-04-11 NOTE — Plan of Care (Signed)
Problem: Consults Goal: Diagnosis - Spinal Surgery Outcome: Completed/Met Date Met:  04/11/14 Cervical Spine Fusion     

## 2014-04-11 NOTE — Progress Notes (Signed)
Utilization review completed.  

## 2014-04-11 NOTE — Anesthesia Postprocedure Evaluation (Signed)
  Anesthesia Post-op Note  Patient: Nicholas Wise  Procedure(s) Performed: Procedure(s): ANTERIOR CERVICAL DECOMPRESSION/DISCECTOMY FUSION CERVICAL THREE-FOUR,CERVICAL FOUR-FIVE,CERVICAL FIVE-SIX (N/A)  Patient Location: PACU  Anesthesia Type:General  Level of Consciousness: awake, alert  and oriented  Airway and Oxygen Therapy: Patient Spontanous Breathing and Patient connected to nasal cannula oxygen  Post-op Pain: mild  Post-op Assessment: Post-op Vital signs reviewed, Patient's Cardiovascular Status Stable, Respiratory Function Stable, Patent Airway, No signs of Nausea or vomiting and Pain level controlled  Post-op Vital Signs: stable  Last Vitals:  Filed Vitals:   04/11/14 1140  BP: 138/72  Pulse: 72  Temp: 36.6 C  Resp: 18    Complications: No apparent anesthesia complications

## 2014-04-11 NOTE — Transfer of Care (Signed)
Immediate Anesthesia Transfer of Care Note  Patient: Nicholas Wise  Procedure(s) Performed: Procedure(s): ANTERIOR CERVICAL DECOMPRESSION/DISCECTOMY FUSION CERVICAL THREE-FOUR,CERVICAL FOUR-FIVE,CERVICAL FIVE-SIX (N/A)  Patient Location: PACU  Anesthesia Type:General  Level of Consciousness: awake, alert  and oriented  Airway & Oxygen Therapy: Patient Spontanous Breathing and Patient connected to nasal cannula oxygen  Post-op Assessment: Report given to PACU RN, Post -op Vital signs reviewed and stable and Patient moving all extremities X 4  Post vital signs: Reviewed and stable  Complications: No apparent anesthesia complications

## 2014-04-11 NOTE — Progress Notes (Signed)
Pt with decreased t waveson EKG,Dr Joslin made aware

## 2014-04-11 NOTE — H&P (Signed)
[place] [place], [place] [address] Phone: [phone]   Patient ID:   [id number] Patient: Nicholas Wise  Date of Birth: 1945-12-17 Visit Type: Office Visit   Date: 02/07/2014 03:45 PM Provider: Marchia Meiers. Vertell Limber MD   This 69 year old male presents for Follow Up of neck pain and Follow Up of numbness.  History of Present Illness: 1.  Follow Up of neck pain  Patient continues to have upper extremity numbness and bilateral hand intrinsic weakness.  Cervical MRI shows cervical stenosis at C3 C4, C4 C5, C5 C6 levels.  I have reviewed the studies and have recommended to the patient based on his complaints of hand numbness weakness as well as hyperreflexia with moderately severe spinal stenosis that he undergo anterior cervical decompression fusion C3 C4, C4 C5, C5 C6 levels.  Patient wishes to proceed with surgery.  He will need to come off Plavix which she has a fairly soft indication and this will occur in October to be fitted for a soft cervical collar and plan to proceed with surgery at that point.  Patient has mild degenerative changes at the C6 C7 level but I do not believe these are of significance.  He has most significant stenosis at C4 C5 but also is infolding of ligament at C3 C4 causing moderate stenosis at this level as well.  The C5 C6 level has significant stenosis and bilateral foraminal stenosis.    2.  Follow Up of numbness    Medical/Surgical/Interim History Reviewed, no change.  Last detailed document date:01/03/2014.   PAST MEDICAL HISTORY, SURGICAL HISTORY, FAMILY HISTORY, SOCIAL HISTORY AND REVIEW OF SYSTEMS I have reviewed the patient's past medical, surgical, family and social history as well as the comprehensive review of systems as included on the [facility] history form dated 01/03/2014, which I have signed.  Family History: Reviewed, no changes.  Last detailed document: 01/03/2014.   Social  History: Tobacco use reviewed. Reviewed, no changes. Last detailed document date: 01/03/2014.      MEDICATIONS(added, continued or stopped this visit):   Started Medication Directions Instruction Stopped   allopurinol 300 mg tablet take 1 tablet by oral route  every day     aspirin 81 mg tablet,delayed release take 1 tablet by oral route  every day     atorvastatin 40 mg tablet take 1 tablet by oral route  every day     clopidogrel 75 mg tablet take 1 tablet by oral route  every day     finasteride 5 mg tablet take 1 tablet by oral route  every day     Flonase 50 mcg/actuation nasal spray,suspension inhale 1 spray by intranasal route 2 times every day in each nostril     potassium citrate ER 10 mEq (1,080 mg) tablet,extended release take 1 tablet by oral route 2 times every day     tamsulosin ER 0.4 mg capsule,extended release 24 hr take 1 capsule by oral route 2 times every day    01/03/2014 Valium 10 mg tablet take one PO prior to procedure      ALLERGIES:  Ingredient Reaction Medication Name Comment  NO KNOWN ALLERGIES     No known allergies. Reviewed, no changes.   Vitals Date Temp F BP Pulse Ht In Wt Lb BMI BSA Pain Score  02/07/2014  130/74 58 69 170 25.1  0/10      DIAGNOSTIC RESULTS Diagnostic report text  CLINICAL DATA: Neck pain and left arm  pain.  EXAM: MRI CERVICAL SPINE WITHOUT CONTRAST  TECHNIQUE: Multiplanar, multisequence MR imaging of the cervical spine was performed. No intravenous contrast was administered.  COMPARISON: Radiographs dated 03/05/2014  FINDINGS: The visualized intracranial contents, paraspinal soft tissues, and cervical spinal cord appear normal.  C1-2: Normal.  C2-3: The C2 and C3 vertebra and the left facet joint are fused. No neural impingement.  C3-4: Minimal disc bulge and osteophytes to the right of midline with no neural impingement. Moderate left and mild right facet arthritis. No foraminal stenosis.  C4-5: Disc  osteophyte complex extends into the left lateral recess and also to the right of midline touching the ventral aspect of the spinal cord. No foraminal stenosis. This could affect either or or both C5 nerves. Moderate right and mild left facet arthritis.  C5-6: Disc space narrowing with prominent osteophytes protruding into the left neural foramen creating fairly severe left foraminal stenosis particularly in the superior aspect of the neural foramen.  C6-7: Broad-based endplate osteophytes without neural impingement. Minimal narrowing of the left neural foramen.  C7-T1: Normal disc. Moderate left facet arthritis.  IMPRESSION: 1. Moderately severe left foraminal stenosis at C5-6 due to osteophytes. 2. Disc osteophyte complex extends into both lateral recesses at C4-5 which could affect either or both C5 nerves.   Electronically Signed By: Rozetta Nunnery M.D. On: 01/23/2014 13:02    IMPRESSION Patient has bilateral hand weakness and numbness with cervical myelopathy on examination.  He has cervical stenosis C3 C4, C4 C5, C5 C6 levels with bi-foraminal stenosis.  Completed Orders (this encounter) Order Details Reason Side Interpretation Result Initial Treatment Date Region  Lifestyle education regarding diet Encouraged to eat a well balanced diet and follow up with primary care physician.         Assessment/Plan # Detail Type Description   1. 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       2. 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       3. 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       4. Assessment BMI 25.0-25.9,ADULT (V85.21).   Plan Orders Today's instructions / counseling include(s) Lifestyle education regarding diet.         Pain Assessment/Treatment Pain Scale: 0/10. Method: Numeric Pain Intensity Scale. Onset: 06/05/2004.  Fall Risk Plan The patient has not fallen in the last year.  Many undergo anterior cervical  decompression fusion C3 C4, C4 C5 and C5 C6 levels.  Patient wishes to proceed with surgery.  He needs come off Plavix and this will occur in October.  He is fitted first saw cervical collar.  Risks and benefits were discussed in detail with patient and wished to proceed.  Orders: Instruction(s)/Education: Assessment Instruction  V85.21 Lifestyle education regarding diet             Provider:  Marchia Meiers. Vertell Limber MD  02/12/2014 03:27 PM Dictation edited by: Marchia Meiers. Vertell Limber    CC Providers: Erline Levine MD [address] [place], [place] [address] ----------------------------------------------------------------------------------------------------------------------------------------------------------------------         Electronically signed by Marchia Meiers. Vertell Limber MD on 02/12/2014 03:27 PM  > [address] [place], [place] [address] Phone: [phone]   Patient ID:   [id number] Patient: Nicholas Wise  Date of Birth: 08-Jul-1946 Visit Type: Office Visit   Date: 01/03/2014 02:30 PM Provider: Marchia Meiers. Vertell Limber MD   This 68 year old male presents for neck pain and numbness.  History of Present Illness: 1.  neck pain  2.  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  He recalls falling asleep in a hot tub 52yrs ago and awakening with neck stiffness.    Hx: TIA 11/09/13 CT (-) Plavix initiated. SxHx: Tonsils 1961; Cartilage repair 1998, 1992  X-ray on [place]  Patient is complaining of neck pain with increased range of motion and numbness in both of his arms left greater than right.  He describes this is generally short-lived.  He says this is been going on since 1105 but is increased significantly over the last 8 months.  He said he fell asleep in a hot tub and awoke with neck stiffness.  He's been to a chiropractor for 7 years.  He was  noted to have TIA and 11/09/13 and was observed in the hospital for 24 hours and had a negative CT of his [place] but was nonetheless started on Plavix on a daily basis.  He currently takes cholesterol medications and 81 mg aspirin.  Patient notes that his neck is painful and he gets tingling more in the left arm than right.        PAST MEDICAL/SURGICAL HISTORY   (Detailed)  Disease/disorder Onset Date Management Date Comments    Tonsillectomy 1961   Arthritis          PAST MEDICAL HISTORY, SURGICAL HISTORY, FAMILY HISTORY, SOCIAL HISTORY AND REVIEW OF SYSTEMS I have reviewed the patient's past medical, surgical, family and social history as well as the comprehensive review of systems as included on the [facility] history form dated 01/03/2014, which I have signed.  Family History  (Detailed)  Relationship Family Member Name Deceased Age at Death Condition Onset Age Cause of Death  Father    Hypertension  N  Father    Diabetes mellitus  N  Mother    Congestive heart failure  N   SOCIAL HISTORY  (Detailed) Tobacco use reviewed. Preferred language is Unknown.   Smoking status: Never smoker.  SMOKING STATUS Use Status Type Smoking Status Usage Per Day Years Used Total Pack Years  no/never  Never smoker       HOME ENVIRONMENT/SAFETY The patient has not fallen in the last year.        MEDICATIONS(added, continued or stopped this visit):   Started Medication Directions Instruction Stopped   allopurinol 300 mg tablet take 1 tablet by oral route  every day     aspirin 81 mg tablet,delayed release take 1 tablet by oral route  every day     atorvastatin 40 mg tablet take 1 tablet by oral route  every day     clopidogrel 75 mg tablet take 1 tablet by oral route  every day     finasteride 5 mg tablet take 1 tablet by oral route  every day     Flonase 50 mcg/actuation nasal spray,suspension inhale 1 spray by intranasal route 2 times every day in each  nostril     potassium citrate ER 10 mEq (1,080 mg) tablet,extended release take 1 tablet by oral route 2 times every day     tamsulosin ER 0.4 mg capsule,extended release 24 hr take 1 capsule by oral route 2 times every day    01/03/2014 Valium 10 mg tablet take one PO prior to procedure      ALLERGIES:  Ingredient Reaction Medication Name Comment  NO KNOWN ALLERGIES     No known  allergies.  REVIEW OF SYSTEMS System Neg/Pos Details  Constitutional Negative Chills, fatigue, fever, malaise, night sweats, weight gain and weight loss.  ENMT Negative Ear drainage, hearing loss, nasal drainage, otalgia, sinus pressure and sore throat.  Eyes Negative Eye discharge, eye pain and vision changes.  Respiratory Negative Chronic cough, cough, dyspnea, known TB exposure and wheezing.  Cardio Negative Chest pain, claudication, edema and irregular heartbeat/palpitations.  GI Negative Abdominal pain, blood in stool, change in stool pattern, constipation, decreased appetite, diarrhea, heartburn, nausea and vomiting.  GU Negative Dribbling, dysuria, erectile dysfunction, hematuria, polyuria, slow stream, urinary frequency, urinary incontinence and urinary retention.  Endocrine Negative Cold intolerance, heat intolerance, polydipsia and polyphagia.  Neuro Positive Numbness in extremity.  Psych Negative Anxiety, depression and insomnia.  Integumentary Negative Brittle hair, brittle nails, change in shape/size of mole(s), hair loss, hirsutism, hives, pruritus, rash and skin lesion.  MS Positive Neck pain.  Hema/Lymph Negative Easy bleeding, easy bruising and lymphadenopathy.  Allergic/Immuno Negative Contact allergy, environmental allergies, food allergies and seasonal allergies.  Reproductive Negative Penile discharge and sexual dysfunction.    Vitals Date Temp F BP Pulse Ht In Wt Lb BMI BSA Pain Score  01/03/2014  161/89 60 69 171 25.25  3/10     PHYSICAL EXAM General Level of Distress: no acute  distress Overall Appearance: normal  [place] and Face  Right Left  Fundoscopic Exam:  normal normal    Cardiovascular Cardiac: regular rate and rhythm without murmur  Right Left  Carotid Pulses: normal normal  Respiratory Lungs: clear to auscultation  Neurological Orientation: normal Recent and Remote Memory: normal Attention Span and Concentration:   normal Language: normal Fund of Knowledge: normal  Right Left Sensation: normal normal Upper Extremity Coordination: normal normal  Lower Extremity Coordination: normal normal  Musculoskeletal Gait and Station: normal  Right Left Upper Extremity Muscle Strength: normal normal Lower Extremity Muscle Strength: normal normal Upper Extremity Muscle Tone:  normal normal Lower Extremity Muscle Tone: normal normal  Motor Strength Upper and lower extremity motor strength was tested in the clinically pertinent muscles. Any abnormal findings will be noted below.   Right Left Grip: 4/5 4/5 Finger Extensor: 4/5 4/5   Deep Tendon Reflexes  Right Left Biceps: increase increase Triceps: increase increase Brachiloradialis: increase increase Patellar: increase increase Achilles: increase increase  Sensory Sensation was tested at C2 to T1.   Cranial Nerves II. Optic Nerve/Visual Fields: normal III. Oculomotor: normal IV. Trochlear: normal V. Trigeminal: normal VI. Abducens: normal VII. Facial: normal VIII. Acoustic/Vestibular: normal IX. Glossopharyngeal: normal X. Vagus: normal XI. Spinal Accessory: normal XII. Hypoglossal: normal  Motor and other Tests Lhermittes: negative Rhomberg: negative Pronator drift: absent     Right Left Spurlings negative positive Hoffman's: normal normal Clonus: normal normal Babinski: normal normal SLR: negative negative Patrick's Corky Sox): negative negative Toe Walk: normal normal Toe Lift: normal normal Heel Walk: normal normal Tinels Elbow: negative negative Tinels  Wrist: negative negative Phalen: negative negative      DIAGNOSTIC RESULTS Cervical radiographs show spondylosis at C4 C5, C5 C6, C6 C7 with a suggestion of canal compromise at these levels due to posteriorly projecting osteophytes.  He also has foraminal stenosis at C3 C4 on the right C4 C5 on the right and C5 C6 on the left.    IMPRESSION Patient has evidence of hyperreflexia and spondylosis with lateral hand intrinsic weakness.  I have recommended an MRI be obtained of the cervical spine.  Completed Orders (this encounter) Order Details Reason Side Interpretation  Result Initial Treatment Date Region  Lifestyle education regarding diet Encouraged to eat a well balanced diet and follow up with primary care physician.        Hypertension education Continue to monitor blood pressure. If remains elevated, contact primary care physician.        Cervial Spine- AP/Lat/Obls/Odontoid/Flex/Ex      01/03/2014 All Levels to All Levels   Assessment/Plan # Detail Type Description   1. 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       2. Assessment Hand weakness (728.87).       3. Assessment Hand numbness (782.0).       4. Assessment BMI 25.0-25.9,ADULT (V85.21).   Plan Orders Today's instructions / counseling include(s) Lifestyle education regarding diet.       5. Assessment Abnormal findings, elevated BP w/o HTN (796.2).         Pain Assessment/Treatment Pain Scale: 3/10. Method: Numeric Pain Intensity Scale. Location: neck. Onset: 06/05/2004. Duration: varies. Quality: aching, throbbing, sharp. Pain Assessment/Treatment follow-up plan of care: Patient taking Aleve for pain twice daily..  Fall Risk Plan The patient has not fallen in the last year.  SEEN THE PATIENT BACK AFTER CERVICAL MRIS BEEN PERFORMED.  Orders: Diagnostic Procedures: Assessment Procedure   Cervial Spine- AP/Lat/Obls/Odontoid/Flex/Ex  721.1 MRI Spinal/cerv W/o Contrast   Instruction(s)/Education: Assessment Instruction  796.2 Hypertension education  V85.21 Lifestyle education regarding diet    MEDICATIONS PRESCRIBED TODAY    Rx Quantity Refills  VALIUM 10 mg  1 0            Provider:  Marchia Meiers. Vertell Limber MD  01/06/2014 06:18 PM Dictation edited by: Marchia Meiers. Vertell Limber    CC Providers: Erline Levine MD [address] [place], [place] [address] ----------------------------------------------------------------------------------------------------------------------------------------------------------------------         Electronically signed by Marchia Meiers. Vertell Limber MD on 01/06/2014 06:18 PM

## 2014-04-11 NOTE — Evaluation (Signed)
Occupational Therapy Evaluation Patient Details Name: Nicholas Wise MRN: [id number] DOB: July 13, 1946 Today's Date: 04/11/2014    History of Present Illness 68 y.o. s/p ANTERIOR CERVICAL DECOMPRESSION/DISCECTOMY FUSION CERVICAL THREE-FOUR,CERVICAL FOUR-FIVE,CERVICAL FIVE-SIX   Clinical Impression   Pt s/p above. Pt moving well and education provided. No further OT needs.     Follow Up Recommendations  No OT follow up;Supervision - Intermittent    Equipment Recommendations  None recommended by OT    Recommendations for Other Services       Precautions / Restrictions Precautions Precautions: Cervical Precaution Comments: educated on precautions Required Braces or Orthoses: Cervical Brace Cervical Brace: Soft collar Restrictions Weight Bearing Restrictions: No      Mobility Bed Mobility Overal bed mobility: Modified Independent             General bed mobility comments: reviewed sit to sidelying  Transfers Overall transfer level: Needs assistance   Transfers: Sit to/from Stand Sit to Stand: Supervision;Modified independent (Device/Increase time)              Balance                                            ADL Overall ADL's : Needs assistance/impaired                 Upper Body Dressing : Supervision/safety;Sitting   Lower Body Dressing: Supervision/safety;Sit to/from stand   Toilet Transfer: Ambulation;Modified Independent           Functional mobility during ADLs: Modified independent General ADL Comments: Educated on technique for LB ADLs and recommended sitting. Recommended spouse be with him for shower transfer. Educated on safety tips for home (rugs, safe shoewear). Discussed button up shirts or shirts with big necks to help prevent neck motion. tightened neck collar for pt. Educated on use of cup for teeth care as well as using straw to prevent neck motion.     Vision                     Perception      Praxis      Pertinent Vitals/Pain Pain Assessment: 0-10 Pain Score: 3  Pain Location: neck Pain Intervention(s): Monitored during session     Hand Dominance     Extremity/Trunk Assessment Upper Extremity Assessment Upper Extremity Assessment: Overall WFL for tasks assessed   Lower Extremity Assessment Lower Extremity Assessment: Overall WFL for tasks assessed       Communication Communication Communication: No difficulties   Cognition Arousal/Alertness: Awake/alert Behavior During Therapy: WFL for tasks assessed/performed Overall Cognitive Status: Within Functional Limits for tasks assessed                     General Comments       Exercises       Shoulder Instructions      Home Living Family/patient expects to be discharged to:: Private residence Living Arrangements: Spouse/significant other Available Help at Discharge: Family               Bathroom Shower/Tub: Walk-in shower;Tub/shower unit         Home Equipment: Shower seat;Shower seat - built in          Prior Functioning/Environment Level of Independence: Independent             OT Diagnosis:     OT  Problem List:     OT Treatment/Interventions:      OT Goals(Current goals can be found in the care plan section)    OT Frequency:     Barriers to D/C:            Co-evaluation              End of Session Equipment Utilized During Treatment: Cervical collar Nurse Communication: Mobility status  Activity Tolerance: Patient tolerated treatment well Patient left: in chair;with family/visitor present   Time: 2831-5176 OT Time Calculation (min): 14 min Charges:  OT General Charges $OT Visit: 1 Procedure OT Evaluation $Initial OT Evaluation Tier I: 1 Procedure G-CodesBenito Mccreedy OTR/L [id number] 04/11/2014, 6:07 PM

## 2014-04-11 NOTE — Brief Op Note (Signed)
04/11/2014  10:21 AM  PATIENT:  Nicholas Wise  68 y.o. 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  PROCEDURE:  Procedure(s): ANTERIOR CERVICAL DECOMPRESSION/DISCECTOMY FUSION CERVICAL THREE-FOUR,CERVICAL FOUR-FIVE,CERVICAL FIVE-SIX (N/A) with allograft, autograft, plate  SURGEON:  Surgeon(s) and Role:    * Erline Levine, MD - Primary    * Newman Pies, MD - Assisting  PHYSICIAN ASSISTANT:   ASSISTANTS: Poteat, RN   ANESTHESIA:   general  EBL:  Total I/O In: 1000 [I.V.:1000] Out: 50 [Blood:50]  BLOOD ADMINISTERED:none  DRAINS: none   LOCAL MEDICATIONS USED:  LIDOCAINE   SPECIMEN:  No Specimen  DISPOSITION OF SPECIMEN:  N/A  COUNTS:  YES  TOURNIQUET:  * No tourniquets in log *  DICTATION: Patient was brought to operating room and following the smooth and uncomplicated induction of general endotracheal anesthesia his Renn was placed on a horseshoe Ibanez holder he was placed in 5 pounds of Holter traction and his anterior neck was prepped and draped in usual sterile fashion. An incision was made on the left side of midline after infiltrating the skin and subcutaneous tissues with local lidocaine. The platysmal layer was incised and subplatysmal dissection was performed exposing the anterior border sternocleidomastoid muscle. Using blunt dissection the carotid sheath was kept lateral and trachea and esophagus kept medial exposing the anterior cervical spine. A bent spinal needle was placed it was felt to be the C4-5  level and this was confirmed on intraoperative x-ray. Longus coli muscles were taken down from the anterior cervical spine using electrocautery and key elevator and self-retaining retractor was placed. The interspace at C 34 was incised  and a thorough discectomy was performed. Distraction pins were placed. Uncinate spurs and central spondylitic ridges were drilled down with a high-speed drill. The spinal cord dura and both C 4 nerve roots were widely decompressed. Hemostasis was assured. After trial sizing a 7 mm machined lordotic cortico-cancellous allograft was selected and packed with local autograft. The graft was tamped into position and countersunk appropriately. The retractor was moved and the interspace at C 45 was incised and a thorough discectomy was performed. A large osteophyte was removed on the right with decompression of the right C 5 nerve root.  Distraction pins were placed. Uncinate spurs and central spondylitic ridges were drilled down with a high-speed drill. The spinal cord dura and both C5 nerve roots were widely decompressed. Hemostasis was assured. After trial sizing a 6 mm similar sized graft was selected, packed and inserted in the interspace in a similar fashion. The interspace at C 56 was incised and a thorough discectomy was performed. Distraction pins were placed. Uncinate spurs and central spondylitic ridges were drilled down with a high-speed drill. The spinal cord dura and both C 6 nerve roots were widely decompressed. A large osteophyte was removed on the right with decompression of the right C 6 nerve root. Hemostasis was assured. After trial sizing a 7 mm similar graft was prepared, packed and inserted in a similar fashion.  Distraction weight was removed. A 60 mm Nuvasive Archon anterior cervical plate was affixed to the cervical spine with 13 mm variable-angle screws 2 at C3, 2 at C4, 2 at C5,  and 2 at C6. All screws were well-positioned and locking mechanisms were engaged. Soft tissues were  inspected and found to be in good repair. The wound was irrigated. A final x-ray was obtained with good visualization at all operated levels with the interbody grafts well visualized. The platysma layer was closed with 3-0  Vicryl stitches and the skin was reapproximated with 3-0 Vicryl subcuticular stitches. The wound was dressed with Dermabond and an occlusive dressing. Counts were correct at the end of the case. Patient was extubated and taken to recovery in stable and satisfactory condition.    PLAN OF CARE: Admit to inpatient   PATIENT DISPOSITION:  PACU - hemodynamically stable.   Delay start of Pharmacological VTE agent (>24hrs) due to surgical blood loss or risk of bleeding: yes

## 2014-04-12 ENCOUNTER — Encounter (HOSPITAL_COMMUNITY): Payer: Self-pay | Admitting: Neurosurgery

## 2014-04-12 NOTE — Discharge Instructions (Signed)
Wound Care Leave incision open to air. You may shower. Do not scrub directly on incision.  Do not put any creams, lotions, or ointments on incision. Activity Walk each and every day, increasing distance each day. No lifting greater than 5 lbs.  Avoid excessive neck motion. No driving for 2 weeks; may ride as a passenger locally. Wear neck brace at all times except when showering.  Diet Resume your normal diet.  Return to Work Will be discussed at you follow up appointment. Call Your Doctor If Any of These Occur Redness, drainage, or swelling at the wound.  Temperature greater than 101 degrees. Severe pain not relieved by pain medication. Increased difficulty swallowing. Incision starts to come apart. Follow Up Appt Call today for appointment in 3-4 weeks [phone]) or for problems.  If you have any hardware placed in your spine, you will need an x-ray before your appointment. 

## 2014-04-12 NOTE — Discharge Summary (Signed)
Physician Discharge Summary  Patient ID: [name] MRN: [id number] DOB/AGE: [id number] y.o.  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 Following uncomplicated ACDF G6-2, 4-5, 5-6 levels, he recovered nicely & transferred to 3500 for observation. He has progressed well, with some urinary retention requiring In & Out Caths overnight.  Consults: None  Significant Diagnostic Studies: radiology: X-Ray: intra-operative  Treatments: surgery: ANTERIOR CERVICAL DECOMPRESSION/DISCECTOMY FUSION CERVICAL THREE-FOUR,CERVICAL FOUR-FIVE,CERVICAL FIVE-SIX (N/A) with allograft, autograft, plate   Discharge Exam: Blood pressure 131/60, pulse 77, temperature 98.2 F (36.8 C), temperature source Oral, resp. rate 18, height 5\' 9"  (1.753 m), weight 78.427 kg (172 lb 14.4 oz), SpO2 97.00%. Alert, conversant, ambulating in room. Good strength BUE. Denies pain at present; well-controlled left side neck pain with Percocet & Valium. Incision without erythema, swelling, or drainage, beneath honeycomb & Dermabond drsg. Some urinary retention, improved overnight.     Disposition: Discharge to home. Pt agrees to follow up with his urologist, Dr. Titus Mould, in [place].  He will continue his Proscar & Flomax for enlarged prostate. Pt & wife verballize understanding of d/c  instructions & agree to call office to schedule 3-4 wk f/u with DrStern.  Rx's to chart for Percocet 5/325 & Valium 5mg . He will stop naproxen.       Medication List    STOP taking these medications       clopidogrel 75 MG tablet  Commonly known as:  PLAVIX      TAKE these medications       allopurinol 300 MG tablet  Commonly known as:  ZYLOPRIM  Take 300 mg by mouth daily.     ALPRAZolam 0.25 MG tablet  Commonly known as:  XANAX  Take 0.25 mg by mouth daily as needed for anxiety.     aspirin EC 81 MG tablet  Take 81 mg by mouth daily.     atorvastatin 40 MG tablet  Commonly known as:  LIPITOR  Take 40 mg by mouth daily.     fexofenadine 180 MG tablet  Commonly known as:  ALLEGRA  Take 180 mg by mouth daily.     finasteride 5 MG tablet  Commonly known as:  PROSCAR  Take 5 mg by mouth daily.     Fish Oil 1000 MG Caps  Take 1,000 mg by mouth 2 (two) times daily.     fluticasone 50 MCG/ACT nasal spray  Commonly known as:  FLONASE  Place 1 spray into both nostrils 2 (two) times daily.     naproxen sodium 220 MG tablet  Commonly known as:  ANAPROX  Take 220 mg by mouth 2 (two) times daily as needed (arthritis pain).     potassium citrate 10 MEQ (1080 MG) SR tablet  Commonly known as:  UROCIT-K  Take 10 mEq by mouth 2 (two) times daily.     SYSTANE OP  Apply 1 drop to eye daily.     tamsulosin 0.4 MG Caps capsule  Commonly known as:  FLOMAX  Take 0.4 mg by mouth 2 (two) times daily.         Signed: Verdis Prime 04/12/2014, 8:02 AM

## 2014-04-12 NOTE — Progress Notes (Signed)
PT Cancellation Note  Patient Details Name: Nicholas Wise MRN: [id number] DOB: 09/25/1945   Cancelled Treatment:    Reason Eval/Treat Not Completed: PT screened, no needs identified, will sign off   Melford Aase 04/12/2014, 8:23 AM Elwyn Reach, [place]

## 2014-04-12 NOTE — Progress Notes (Signed)
PT. HAS BEEN UP TO THE BATHROOM HAD VOIDING  FREQUENTLY SINCE HE HAD THE IN&OUT CATH AT 1615.  PT. VOIDED A TOTAL OF 1775 CC FROM 1945 TO 0030.  AT 0200 HE COMPLAINED THAT HE WAS NOW UNABLE TO VOID AND HE WAS IN DISCOMFORT.  BLADDER SCAN SHOWED 850 CC AND IN&OUT AT 0201 OBTAINED 49 CC.  PT. TOLERATED WELL AND SAID HE FELT RELIEF FROM THE EARLIER DISCOMFORT.  [place] RN

## 2014-04-12 NOTE — Progress Notes (Signed)
Subjective: Patient reports "I feel good...just haviing some trouble passing my water"  Objective: Vital signs in last 24 hours: Temp:  [96.8 F (36 C)-98.2 F (36.8 C)] 98.2 F (36.8 C) (09/29 0317) Pulse Rate:  [65-102] 77 (09/29 0317) Resp:  [15-24] 18 (09/29 0317) BP: (124-167)/(37-86) 131/60 mmHg (09/29 0317) SpO2:  [94 %-100 %] 97 % (09/29 0317)  Intake/Output from previous day: 09/28 0701 - 09/29 0700 In: 1900 [P.O.:600; I.V.:1300] Out: 3325 [Urine:3275; Blood:50] Intake/Output this shift:    Alert, conversant, ambulating in room. Good strength BUE. Denies pain at present; well-controlled left side neck pain with Percocet & Valium. Incision without erythema, swelling, or drainage, beneath honeycomb & Dermabond drsg. Some urinary retention, improved overnight.   Lab Results: No results found for this basename: WBC, HGB, HCT, PLT,  in the last 72 hours BMET No results found for this basename: NA, K, CL, CO2, GLUCOSE, BUN, CREATININE, CALCIUM,  in the last 72 hours  Studies/Results: Dg Cervical Spine 2-3 Views  04/11/2014   CLINICAL DATA:  Cervical spinal fusion.  EXAM: CERVICAL SPINE - 2-3 VIEW  COMPARISON:  MRI 01/22/2014  FINDINGS: The first image demonstrates an endotracheal tube and a surgical marker along the anterior aspect of C4-C5. The second image demonstrates anterior plate and screw fixation at C3-C6 with interbody devices.  IMPRESSION: Cervical fusion at C3-C6.   Electronically Signed   By: Markus Daft M.D.   On: 04/11/2014 11:18    Assessment/Plan: Improved   LOS: 1 day  Will monitor voiding/retention over next couple hours, allowing d/c home per DrStern with or without foley.  Pt plans to follow up with his urologist DrCarbone in [place], and currently takes Proscar & Flomax for enlarged prostate. Pt & wife verballize understanding of d/c instructions & agree to call office to schedule 3-4 wk f/u with DrStern.  Rx's to chart for Percocet 5/325 & Valium  5mg .   Verdis Prime 04/12/2014, 7:54 AM

## 2014-04-12 NOTE — Progress Notes (Signed)
Patient alert and oriented, mae's well, voiding adequate amount of urine, swallowing without difficulty, c/o slight pain and meds given prior to discharged. Patient discharged home with family. Script and discharged instructions given to patient. Patient and family stated understanding of instructions given. Patient to call MD office for follow-up appointment. Conley Rolls I RN

## 2016-12-30 ENCOUNTER — Encounter: Payer: Self-pay | Admitting: Gastroenterology

## 2017-01-10 ENCOUNTER — Encounter: Payer: Self-pay | Admitting: Gastroenterology

## 2017-01-10 ENCOUNTER — Ambulatory Visit (INDEPENDENT_AMBULATORY_CARE_PROVIDER_SITE_OTHER): Payer: Medicare Other | Admitting: Gastroenterology

## 2017-01-10 ENCOUNTER — Encounter (INDEPENDENT_AMBULATORY_CARE_PROVIDER_SITE_OTHER): Payer: Self-pay

## 2017-01-10 VITALS — BP 128/74 | HR 68 | Ht 69.5 in | Wt 173.0 lb

## 2017-01-10 DIAGNOSIS — Z1211 Encounter for screening for malignant neoplasm of colon: Secondary | ICD-10-CM

## 2017-01-10 DIAGNOSIS — Z7902 Long term (current) use of antithrombotics/antiplatelets: Secondary | ICD-10-CM | POA: Diagnosis not present

## 2017-01-10 NOTE — Patient Instructions (Signed)
You may have a light breakfast the morning of prep day (the day before the procedure).   You may choose from one of the following items: eggs and toast OR chicken noodle soup and crackers.   You should have your breakfast completed between 8:00 and 9:00 am the day before your procedure.    After you have had your light breakfast you should start a clear liquid diet only, NO SOLIDS. No additional solid food is allowed. You may continue to have clear liquid up to 3 hours prior to your procedure.   You have been scheduled for a colonoscopy. Please follow written instructions given to you at your visit today.  Please pick up your prep supplies at the pharmacy within the next 1-3 days. If you use inhalers (even only as needed), please bring them with you on the day of your procedure. Your physician has requested that you go to www.startemmi.com and enter the access code given to you at your visit today. This web site gives a general overview about your procedure. However, you should still follow specific instructions given to you by our office regarding your preparation for the procedure.  

## 2017-01-10 NOTE — Progress Notes (Addendum)
01/10/2017 Nicholas Wise [facility] [id number] Mar 14, 1946   HISTORY OF PRESENT ILLNESS:  This is a pleasant 71 year old male who is new to our office. He is here today to discuss and schedule colonoscopy. His last colonoscopy was 13 years ago by Dr. West Carbo in [place], [place]. He says the study was normal at that time. He does not have any GI complaints. He says that he moves his bowels regularly with the help of high-fiber diet, lots of fruits and vegetables including prunes and prune juice. He is on Plavix for history of TIA. That is managed by his PCP, Dr. Tawny Asal.   Past Medical History:  Diagnosis Date  . Arthritis   . Hemorrhoids   . Hiatal hernia   . High cholesterol   . Numbness and tingling in left hand   . PONV (postoperative nausea and vomiting)   . TIA (transient ischemic attack)   . Urinary frequency    Past Surgical History:  Procedure Laterality Date  . ANTERIOR CERVICAL DECOMP/DISCECTOMY FUSION N/A 04/11/2014   Procedure: ANTERIOR CERVICAL DECOMPRESSION/DISCECTOMY FUSION CERVICAL THREE-FOUR,CERVICAL FOUR-FIVE,CERVICAL FIVE-SIX;  Surgeon: Erline Levine, MD;  Location: [facility];  Service: Neurosurgery;  Laterality: N/A;  . KNEE SURGERY Right   . TONSILLECTOMY      reports that he has never smoked. He has never used smokeless tobacco. He reports that he drinks alcohol. He reports that he does not use drugs. family history includes Diabetes in his father; Heart disease in his father. No Known Allergies    Outpatient Encounter Prescriptions as of 01/10/2017  Medication Sig  . cetirizine (ZYRTEC) 10 MG tablet Take 10 mg by mouth daily.  . clopidogrel (PLAVIX) 75 MG tablet Take 75 mg by mouth daily.  Marland Kitchen allopurinol (ZYLOPRIM) 300 MG tablet Take 300 mg by mouth daily.  Marland Kitchen ALPRAZolam (XANAX) 0.25 MG tablet Take 0.25 mg by mouth daily as needed for anxiety.  Marland Kitchen atorvastatin (LIPITOR) 40 MG tablet Take 40 mg by mouth daily.  . finasteride (PROSCAR) 5 MG tablet Take 5 mg by  mouth daily.  . fluticasone (FLONASE) 50 MCG/ACT nasal spray Place 1 spray into both nostrils 2 (two) times daily.  . naproxen sodium (ANAPROX) 220 MG tablet Take 220 mg by mouth 2 (two) times daily as needed (arthritis pain).  . Omega-3 Fatty Acids (FISH OIL) 1000 MG CAPS Take 1,000 mg by mouth 2 (two) times daily.  Vladimir Faster Glycol-Propyl Glycol (SYSTANE OP) Apply 1 drop to eye daily.  . potassium citrate (UROCIT-K) 10 MEQ (1080 MG) SR tablet Take 10 mEq by mouth 2 (two) times daily.  . tamsulosin (FLOMAX) 0.4 MG CAPS capsule Take 0.4 mg by mouth 2 (two) times daily.  . [DISCONTINUED] aspirin EC 81 MG tablet Take 81 mg by mouth daily.  . [DISCONTINUED] fexofenadine (ALLEGRA) 180 MG tablet Take 180 mg by mouth daily.   No facility-administered encounter medications on file as of 01/10/2017.      REVIEW OF SYSTEMS  : All other systems reviewed and negative except where noted in the History of Present Illness.   PHYSICAL EXAM: BP 128/74   Pulse 68   Ht 5' 9.5" (1.765 m)   Wt 173 lb (78.5 kg)   BMI 25.18 kg/m  General: Well developed white male in no acute distress [place]: Normocephalic and atraumatic Eyes:  Sclerae anicteric, conjunctiva pink. Ears: Normal auditory acuity Lungs: Clear throughout to auscultation; no increased WOB. Heart: Regular rate and rhythm Abdomen: Soft, non-distended.  BS present.  Non-tender. Rectal:  Will be done at the time of colonoscopy. Musculoskeletal: Symmetrical with no gross deformities  Skin: No lesions on visible extremities Extremities: No edema  Neurological: Alert oriented x 4, grossly non-focal Psychological:  Alert and cooperative. Normal mood and affect  ASSESSMENT AND PLAN: -Screening colonoscopy:  Last was 13 years ago by Dr. West Carbo.  Will schedule with Dr. Carlean Purl. -Chronic antiplatelet use with Plavix due to history of TIA:  Hold Plavix for 5 days before procedure - will instruct when and how to resume after procedure. Risks and  benefits of procedure including bleeding, perforation, infection, missed lesions, medication reactions and possible hospitalization or surgery if complications occur explained. Additional rare but real risk of cardiovascular event such as heart attack or ischemia/infarct of other organs off of Plavix explained and need to seek urgent help if this occurs. Will communicate by phone or EMR with patient's prescribing provider, Dr. Tawny Asal, that holding Plavix is reasonable in this case.   Agree with Ms. Alphia Kava management.  Gatha Mayer, MD, [facility]

## 2017-01-13 ENCOUNTER — Other Ambulatory Visit: Payer: Self-pay | Admitting: *Deleted

## 2017-01-16 ENCOUNTER — Telehealth: Payer: Self-pay | Admitting: *Deleted

## 2017-01-16 NOTE — Telephone Encounter (Signed)
Dr. Pat Kocher faxed back his response for the Plavix clearance instructions.  The patient can hold Plavix for 5 days prior to and including the colonoscopy date of 01-21-2017.  He resume after the procedure.  Chong Sicilian [place] CMA informed the patient of this on 01-14-2017.

## 2017-01-21 ENCOUNTER — Encounter: Payer: Self-pay | Admitting: Internal Medicine

## 2017-01-21 ENCOUNTER — Ambulatory Visit (AMBULATORY_SURGERY_CENTER): Payer: Medicare Other | Admitting: Internal Medicine

## 2017-01-21 VITALS — BP 126/70 | HR 56 | Temp 97.8°F | Resp 15 | Ht 69.5 in | Wt 173.0 lb

## 2017-01-21 DIAGNOSIS — Z1211 Encounter for screening for malignant neoplasm of colon: Secondary | ICD-10-CM | POA: Diagnosis not present

## 2017-01-21 DIAGNOSIS — D123 Benign neoplasm of transverse colon: Secondary | ICD-10-CM | POA: Diagnosis not present

## 2017-01-21 DIAGNOSIS — Z1212 Encounter for screening for malignant neoplasm of rectum: Secondary | ICD-10-CM

## 2017-01-21 MED ORDER — SODIUM CHLORIDE 0.9 % IV SOLN: 500.0000 mL | INTRAVENOUS | Status: DC

## 2017-01-21 NOTE — Op Note (Signed)
[name] Procedure Date: 01/21/2017 4:20 PM MRN: [id number] Endoscopist: Gatha Mayer , MD Age: 71 Referring MD:  Date of Birth: [birth year] Gender: Male Account #: [id number] Procedure:                Colonoscopy Indications:              Screening for colorectal malignant neoplasm Medicines:                Propofol per Anesthesia, Monitored Anesthesia Care Procedure:                Pre-Anesthesia Assessment:                           - Prior to the procedure, a History and Physical                            was performed, and patient medications and                            allergies were reviewed. The patient's tolerance of                            previous anesthesia was also reviewed. The risks                            and benefits of the procedure and the sedation                            options and risks were discussed with the patient.                            All questions were answered, and informed consent                            was obtained. Prior Anticoagulants: The patient                            last took Plavix (clopidogrel) 5 days prior to the                            procedure. ASA Grade Assessment: II - A patient                            with mild systemic disease. After reviewing the                            risks and benefits, the patient was deemed in                            satisfactory condition to undergo the procedure.                           After obtaining informed consent, the colonoscope  was passed under direct vision. Throughout the                            procedure, the patient's blood pressure, pulse, and                            oxygen saturations were monitored continuously. The                            Colonoscope was introduced through the anus and                            advanced to the the cecum, identified by   appendiceal orifice and ileocecal valve. The                            colonoscopy was performed without difficulty. The                            patient tolerated the procedure well. The quality                            of the bowel preparation was good. The bowel                            preparation used was Miralax. The ileocecal valve,                            appendiceal orifice, and rectum were photographed. Scope In: 4:24:42 PM Scope Out: 4:39:47 PM Scope Withdrawal Time: 0 hours 9 minutes 52 seconds  Total Procedure Duration: 0 hours 15 minutes 5 seconds  Findings:                 The perianal and digital rectal examinations were                            normal. Pertinent negatives include normal prostate                            (size, shape, and consistency).                           A diminutive polyp was found in the transverse                            colon. The polyp was sessile. The polyp was removed                            with a cold snare. Resection and retrieval were                            complete. Verification of patient identification                            for  the specimen was done. Estimated blood loss was                            minimal.                           Multiple small and large-mouthed diverticula were                            found in the sigmoid colon.                           The exam was otherwise without abnormality on                            direct and retroflexion views. Complications:            No immediate complications. Estimated Blood Loss:     Estimated blood loss was minimal. Impression:               - One diminutive polyp in the transverse colon,                            removed with a cold snare. Resected and retrieved.                           - Diverticulosis in the sigmoid colon.                           - The examination was otherwise normal on direct                            and  retroflexion views. Recommendation:           - Patient has a contact number available for                            emergencies. The signs and symptoms of potential                            delayed complications were discussed with the                            patient. Return to normal activities tomorrow.                            Written discharge instructions were provided to the                            patient.                           - Resume previous diet.                           - Continue present medications.                           -  Repeat colonoscopy is recommended for                            surveillance. The colonoscopy date will be                            determined after pathology results from today's                            exam become available for review. Gatha Mayer, MD 01/21/2017 4:42:53 PM This report has been signed electronically.

## 2017-01-21 NOTE — Progress Notes (Signed)
I have reviewed the patient's medical history in detail and updated the computerized patient record.

## 2017-01-21 NOTE — Progress Notes (Signed)
Alert and oriented x3, pleased with MAC, report to RN megan 

## 2017-01-21 NOTE — Progress Notes (Signed)
Called to room to assist during endoscopic procedure.  Patient ID and intended procedure confirmed with present staff. Received instructions for my participation in the procedure from the performing physician.  

## 2017-01-21 NOTE — Patient Instructions (Addendum)
   I removed one tiny polyp that looks benign. I will let you know pathology results and when to have another routine colonoscopy by mail and/or My Chart.  You also have a condition called diverticulosis - common and not usually a problem. Please read the handout provided.  Please restart clopidogrel (Plavix) tonight.  I appreciate the opportunity to care for you. Gatha Mayer, MD, [facility]   **Handout given on polyps**  YOU HAD AN ENDOSCOPIC PROCEDURE TODAY: Refer to the procedure report and other information in the discharge instructions given to you for any specific questions about what was found during the examination. If this information does not answer your questions, please call [place] office at [phone] to clarify.   YOU SHOULD EXPECT: Some feelings of bloating in the abdomen. Passage of more gas than usual. Walking can help get rid of the air that was put into your GI tract during the procedure and reduce the bloating. If you had a lower endoscopy (such as a colonoscopy or flexible sigmoidoscopy) you may notice spotting of blood in your stool or on the toilet paper. Some abdominal soreness may be present for a day or two, also.  DIET: Your first meal following the procedure should be a light meal and then it is ok to progress to your normal diet. A half-sandwich or bowl of soup is an example of a good first meal. Heavy or fried foods are harder to digest and may make you feel nauseous or bloated. Drink plenty of fluids but you should avoid alcoholic beverages for 24 hours. If you had a esophageal dilation, please see attached instructions for diet.    ACTIVITY: Your care partner should take you home directly after the procedure. You should plan to take it easy, moving slowly for the rest of the day. You can resume normal activity the day after the procedure however YOU SHOULD NOT DRIVE, use power tools, machinery or perform tasks that involve climbing or major physical exertion  for 24 hours (because of the sedation medicines used during the test).   SYMPTOMS TO REPORT IMMEDIATELY: A gastroenterologist can be reached at any hour. Please call [phone]  for any of the following symptoms:  Following lower endoscopy (colonoscopy, flexible sigmoidoscopy) Excessive amounts of blood in the stool  Significant tenderness, worsening of abdominal pains  Swelling of the abdomen that is new, acute  Fever of 100 or higher    FOLLOW UP:  If any biopsies were taken you will be contacted by phone or by letter within the next 1-3 weeks. Call [phone]  if you have not heard about the biopsies in 3 weeks.  Please also call with any specific questions about appointments or follow up tests.

## 2017-01-22 ENCOUNTER — Telehealth: Payer: Self-pay | Admitting: *Deleted

## 2017-01-22 NOTE — Telephone Encounter (Signed)
  Follow up Call-  Call back number 01/21/2017  Post procedure Call Back phone  # [phone]  Permission to leave phone message Yes  Some recent data might be hidden     Patient questions:  Do you have a fever, pain , or abdominal swelling? No. Pain Score  0 *  Have you tolerated food without any problems? Yes.    Have you been able to return to your normal activities? Yes.    Do you have any questions about your discharge instructions: Diet   No. Medications  No. Follow up visit  No.  Do you have questions or concerns about your Care? No.  Actions: * If pain score is 4 or above: No action needed, pain <4.

## 2017-01-22 NOTE — Telephone Encounter (Signed)
  Follow up Call-  Call back number 01/21/2017  Post procedure Call Back phone  # [phone]  Permission to leave phone message Yes  Some recent data might be hidden     Patient questions:  Message left to call us if necessary.

## 2017-01-31 ENCOUNTER — Encounter: Payer: Self-pay | Admitting: Internal Medicine

## 2017-01-31 DIAGNOSIS — Z860101 Personal history of adenomatous and serrated colon polyps: Secondary | ICD-10-CM

## 2017-01-31 DIAGNOSIS — Z86010 Personal history of colonic polyps: Secondary | ICD-10-CM

## 2017-01-31 HISTORY — DX: Personal history of colonic polyps: Z86.010

## 2017-01-31 HISTORY — DX: Personal history of adenomatous and serrated colon polyps: Z86.0101

## 2017-01-31 NOTE — Progress Notes (Signed)
Diminutive adenoma recall 2023

## 2017-11-11 ENCOUNTER — Other Ambulatory Visit: Payer: Self-pay | Admitting: Family Medicine

## 2017-11-11 DIAGNOSIS — M545 Low back pain, unspecified: Secondary | ICD-10-CM

## 2017-11-16 ENCOUNTER — Other Ambulatory Visit: Payer: Medicare Other

## 2017-11-16 ENCOUNTER — Ambulatory Visit
Admission: RE | Admit: 2017-11-16 | Discharge: 2017-11-16 | Disposition: A | Discharge status: Home / Self Care | Payer: Medicare Other | Source: Ambulatory Visit | Attending: Family Medicine | Admitting: Family Medicine

## 2017-11-16 DIAGNOSIS — M545 Low back pain, unspecified: Secondary | ICD-10-CM

## 2018-01-05 ENCOUNTER — Ambulatory Visit (INDEPENDENT_AMBULATORY_CARE_PROVIDER_SITE_OTHER): Payer: Medicare Other | Admitting: Interventional Cardiology

## 2018-01-05 ENCOUNTER — Encounter: Payer: Self-pay | Admitting: Interventional Cardiology

## 2018-01-05 ENCOUNTER — Encounter (INDEPENDENT_AMBULATORY_CARE_PROVIDER_SITE_OTHER): Payer: Self-pay

## 2018-01-05 ENCOUNTER — Ambulatory Visit (HOSPITAL_COMMUNITY): Payer: Medicare Other | Attending: Cardiology

## 2018-01-05 ENCOUNTER — Other Ambulatory Visit: Payer: Self-pay

## 2018-01-05 VITALS — BP 138/74 | HR 67 | Ht 69.5 in | Wt 163.4 lb

## 2018-01-05 DIAGNOSIS — Z01810 Encounter for preprocedural cardiovascular examination: Secondary | ICD-10-CM

## 2018-01-05 DIAGNOSIS — E785 Hyperlipidemia, unspecified: Secondary | ICD-10-CM | POA: Insufficient documentation

## 2018-01-05 DIAGNOSIS — R011 Cardiac murmur, unspecified: Secondary | ICD-10-CM

## 2018-01-05 DIAGNOSIS — Z8673 Personal history of transient ischemic attack (TIA), and cerebral infarction without residual deficits: Secondary | ICD-10-CM | POA: Diagnosis not present

## 2018-01-05 DIAGNOSIS — I350 Nonrheumatic aortic (valve) stenosis: Secondary | ICD-10-CM | POA: Insufficient documentation

## 2018-01-05 DIAGNOSIS — E782 Mixed hyperlipidemia: Secondary | ICD-10-CM

## 2018-01-05 LAB — ECHOCARDIOGRAM COMPLETE
Height: 69.5 in
Weight: 2614.4 oz

## 2018-01-05 NOTE — Patient Instructions (Signed)
Medication Instructions:  Your physician recommends that you continue on your current medications as directed. Please refer to the Current Medication list given to you today.   Labwork: None ordered  Testing/Procedures: Your physician has requested that you have an echocardiogram. Echocardiography is a painless test that uses sound waves to create images of your heart. It provides your doctor with information about the size and shape of your heart and how well your heart's chambers and valves are working. This procedure takes approximately one hour. There are no restrictions for this procedure.  We will determine clearance after your echocardiogram.  Follow-Up: Your physician wants you to follow-up in: 1 year with Dr. Irish Lack. You will receive a reminder letter in the mail two months in advance. If you don't receive a letter, please call our office to schedule the follow-up appointment.   Any Other Special Instructions Will Be Listed Below (If Applicable).  Echocardiogram An echocardiogram, or echocardiography, uses sound waves (ultrasound) to produce an image of your heart. The echocardiogram is simple, painless, obtained within a short period of time, and offers valuable information to your health care provider. The images from an echocardiogram can provide information such as:  Evidence of coronary artery disease (CAD).  Heart size.  Heart muscle function.  Heart valve function.  Aneurysm detection.  Evidence of a past heart attack.  Fluid buildup around the heart.  Heart muscle thickening.  Assess heart valve function.  Tell a health care provider about:  Any allergies you have.  All medicines you are taking, including vitamins, herbs, eye drops, creams, and over-the-counter medicines.  Any problems you or family members have had with anesthetic medicines.  Any blood disorders you have.  Any surgeries you have had.  Any medical conditions you have.  Whether  you are pregnant or may be pregnant. What happens before the procedure? No special preparation is needed. Eat and drink normally. What happens during the procedure?  In order to produce an image of your heart, gel will be applied to your chest and a wand-like tool (transducer) will be moved over your chest. The gel will help transmit the sound waves from the transducer. The sound waves will harmlessly bounce off your heart to allow the heart images to be captured in real-time motion. These images will then be recorded.  You may need an IV to receive a medicine that improves the quality of the pictures. What happens after the procedure? You may return to your normal schedule including diet, activities, and medicines, unless your health care provider tells you otherwise. This information is not intended to replace advice given to you by your health care provider. Make sure you discuss any questions you have with your health care provider. Document Released: 06/28/2000 Document Revised: 02/17/2016 Document Reviewed: 03/08/2013 Elsevier Interactive Patient Education  2017 [employer].    If you need a refill on your cardiac medications before your next appointment, please call your pharmacy.

## 2018-01-05 NOTE — Progress Notes (Signed)
Cardiology Office Note   Date:  01/05/2018   ID:  Nicholas Wise, DOB 08-27-1945, MRN [id number]  PCP:  Josem Kaufmann, MD Phone: [phone]; Fax [phone]   No chief complaint on file.  murmur  Wt Readings from Last 3 Encounters:  01/05/18 163 lb 6.4 oz (74.1 kg)  01/21/17 173 lb (78.5 kg)  01/10/17 173 lb (78.5 kg)       History of Present Illness: Nicholas Wise is a 72 y.o. male who is being seen today for the evaluation of heart murmur; preoperative clearance at the request of Josem Kaufmann, MD. , Dr. Vertell Limber  He needs to have a lumbar fusion.  A murmur was detected and he needs preoperative clearance.  History includes a TIA in 2015.  He was started on plavix.  No bleeding problems more than nuisance bleeding.  No aspirin.  Denies : Chest pain. Dizziness. Leg edema. Nitroglycerin use. Orthopnea. Palpitations. Paroxysmal nocturnal dyspnea. Shortness of breath. Syncope.   He does yard work without problems.  He walks up stairs in home and has no cardiac sx.  He is more limited by his knees and back.    Past Medical History:  Diagnosis Date  . Arthritis   . Hemorrhoids   . Hiatal hernia   . High cholesterol   . Hx of adenomatous polyp of colon 01/31/2017  . Numbness and tingling in left hand   . PONV (postoperative nausea and vomiting)   . TIA (transient ischemic attack)   . Urinary frequency     Past Surgical History:  Procedure Laterality Date  . ANTERIOR CERVICAL DECOMP/DISCECTOMY FUSION N/A 04/11/2014   Procedure: ANTERIOR CERVICAL DECOMPRESSION/DISCECTOMY FUSION CERVICAL THREE-FOUR,CERVICAL FOUR-FIVE,CERVICAL FIVE-SIX;  Surgeon: Erline Levine, MD;  Location: [facility];  Service: Neurosurgery;  Laterality: N/A;  . COLONOSCOPY    . KNEE SURGERY Right   . TONSILLECTOMY       Current Outpatient Medications  Medication Sig Dispense Refill  . acetaminophen (TYLENOL) 500 MG tablet Take 500 mg by mouth 2 (two) times daily.    Marland Kitchen allopurinol (ZYLOPRIM)  300 MG tablet Take 300 mg by mouth daily.    Marland Kitchen ALPRAZolam (XANAX) 0.5 MG tablet Take 0.5 mg by mouth 2 (two) times daily.  1  . atorvastatin (LIPITOR) 40 MG tablet Take 40 mg by mouth daily.    . cetirizine (ZYRTEC) 10 MG tablet Take 10 mg by mouth daily.    . clopidogrel (PLAVIX) 75 MG tablet Take 75 mg by mouth daily.    . finasteride (PROSCAR) 5 MG tablet Take 5 mg by mouth daily.    . Omega-3 Fatty Acids (FISH OIL) 1000 MG CAPS Take 1,000 mg by mouth 2 (two) times daily.    Vladimir Faster Glycol-Propyl Glycol (SYSTANE OP) Apply 1 drop to eye daily.    . potassium citrate (UROCIT-K) 10 MEQ (1080 MG) SR tablet Take 10 mEq by mouth 2 (two) times daily.    . tamsulosin (FLOMAX) 0.4 MG CAPS capsule Take 0.4 mg by mouth 2 (two) times daily.    Marland Kitchen ALPRAZolam (XANAX) 0.25 MG tablet Take 0.25 mg by mouth daily as needed for anxiety.    . fluticasone (FLONASE) 50 MCG/ACT nasal spray Place 1 spray into both nostrils 2 (two) times daily.    . naproxen sodium (ANAPROX) 220 MG tablet Take 220 mg by mouth 2 (two) times daily as needed (arthritis pain).     Current Facility-Administered Medications  Medication Dose Route Frequency Provider Last Rate Last Dose  . 0.9 %  sodium chloride infusion  500 mL Intravenous Continuous Gatha Mayer, MD        Allergies:   Patient has no known allergies.    Social History:  The patient  reports that he has never smoked. He has never used smokeless tobacco. He reports that he drinks alcohol. He reports that he does not use drugs.   Family History:  The patient's family history includes Diabetes in his father; Heart disease in his father. Father had CABG in his 23s.  He was > 400 lbs and smoked.   ROS:  Please see the history of present illness.   Otherwise, review of systems are positive for back pain radiating to the leg.   All other systems are reviewed and negative.    PHYSICAL EXAM: VS:  BP 138/74   Pulse 67   Ht 5' 9.5" (1.765 m)   Wt 163 lb 6.4 oz (74.1  kg)   SpO2 97%   BMI 23.78 kg/m  , BMI Body mass index is 23.78 kg/m. GEN: Well nourished, well developed, in no acute distress  HEENT: normal  Neck: no JVD, carotid bruits, or masses Cardiac: RRR; 2/6 harsh systolic murmur, no rubs, or gallops,no edema  Respiratory:  clear to auscultation bilaterally, normal work of breathing GI: soft, nontender, nondistended, + BS MS: no deformity or atrophy  Skin: warm and dry, no rash Neuro:  Strength and sensation are intact Psych: euthymic mood, full affect   EKG:   The ekg ordered today demonstrates NSR, RBBB, no ST changes   Recent Labs: No results found for requested labs within last 8760 hours.   Lipid Panel No results found for: CHOL, TRIG, HDL, CHOLHDL, VLDL, LDLCALC, LDLDIRECT   Other studies Reviewed: Additional studies/ records that were reviewed today with results demonstrating: records from Dr. Vertell Limber.   ASSESSMENT AND PLAN:  1. Murmur: Plan for echo. Sounds like aortic valve murmur.  I doubt it is severe, but would like to evaluate before surgery. 2. Preoperative cardiovascular exam: Plan for echo.  No ischemia testing needed at this time. OK to hold Plavix 5 days prior to surgery. 3. Hyperlipidemia: Controlled per his report. Continue atorvastatin and fish oil.  Checked with Dr. Tawny Asal in [place], [place].  Will obtain records.   Current medicines are reviewed at length with the patient today.  The patient concerns regarding his medicines were addressed.  The following changes have been made:  No change  Labs/ tests ordered today include:   Orders Placed This Encounter  Procedures  . EKG 12-Lead  . ECHOCARDIOGRAM COMPLETE    Recommend 150 minutes/week of aerobic exercise Low fat, low carb, high fiber diet recommended  Disposition:   FU in 1 year   Signed, Larae Grooms, MD  01/05/2018 9:07 AM    [facility] [address], [place],   [address] Phone: [phone]; Fax: [phone]

## 2018-01-06 ENCOUNTER — Other Ambulatory Visit: Payer: Self-pay | Admitting: Neurosurgery

## 2018-02-09 NOTE — Pre-Procedure Instructions (Signed)
[facility]  02/09/2018      [place], [place] [address] Phone: [phone] Fax: [phone]    Your procedure is scheduled on Friday August 9.  Report to [facility] Admitting at 9:15 A.M.  Call this number if you have problems the morning of surgery:  [phone]   Remember:  Do not eat or drink after midnight.    Take these medicines the morning of surgery with A SIP OF WATER:   Allopurinol (zyloprim) Cetirizine (zyrtec) Finasteride (proscar) Tamsulosin (flomax) Nasocort Alprazolam (Xanax) if needed acetaminophen (tylenol) if needed  7 days prior to surgery STOP taking any Aspirin(unless otherwise instructed by your surgeon), Aleve, Naproxen, Ibuprofen, Motrin, Advil, Goody's, BC's, all herbal medications, fish oil, and all vitamins  **FOLLOW your surgeon's instructions on stopping Plavix (clopidogrel)     Do not wear jewelry  Do not wear lotions, powders, or colognes, or deodorant.  Do not shave 48 hours prior to surgery.  Men may shave face and neck.  Do not bring valuables to the hospital.  [facility] is not responsible for any belongings or valuables.  Contacts, dentures or bridgework may not be worn into surgery.  Leave your suitcase in the car.  After surgery it may be brought to your room.  For patients admitted to the hospital, discharge time will be determined by your treatment team.  Patients discharged the day of surgery will not be allowed to drive home.   Special instructions:    [facility]- Preparing For Surgery  Before surgery, you can play an important role. Because skin is not sterile, your skin needs to be as free of germs as possible. You can reduce the number of germs on your skin by washing with CHG (chlorahexidine gluconate) Soap before surgery.  CHG is an antiseptic cleaner which kills germs and bonds with the skin to continue killing germs even after  washing.    Oral Hygiene is also important to reduce your risk of infection.  Remember - BRUSH YOUR TEETH THE MORNING OF SURGERY WITH YOUR REGULAR TOOTHPASTE  Please do not use if you have an allergy to CHG or antibacterial soaps. If your skin becomes reddened/irritated stop using the CHG.  Do not shave (including legs and underarms) for at least 48 hours prior to first CHG shower. It is OK to shave your face.  Please follow these instructions carefully.   1. Shower the NIGHT BEFORE SURGERY and the MORNING OF SURGERY with CHG.   2. If you chose to wash your hair, wash your hair first as usual with your normal shampoo.  3. After you shampoo, rinse your hair and body thoroughly to remove the shampoo.  4. Use CHG as you would any other liquid soap. You can apply CHG directly to the skin and wash gently with a scrungie or a clean washcloth.   5. Apply the CHG Soap to your body ONLY FROM THE NECK DOWN.  Do not use on open wounds or open sores. Avoid contact with your eyes, ears, mouth and genitals (private parts). Wash Face and genitals (private parts)  with your normal soap.  6. Wash thoroughly, paying special attention to the area where your surgery will be performed.  7. Thoroughly rinse your body with warm water from the neck down.  8. DO NOT shower/wash with your normal soap after using and rinsing off the CHG Soap.  9. Fraser Din  yourself dry with a CLEAN TOWEL.  10. Wear CLEAN PAJAMAS to bed the night before surgery, wear comfortable clothes the morning of surgery  11. Place CLEAN SHEETS on your bed the night of your first shower and DO NOT SLEEP WITH PETS.    Day of Surgery:  Do not apply any deodorants/lotions.  Please wear clean clothes to the hospital/surgery center.   Remember to brush your teeth WITH YOUR REGULAR TOOTHPASTE.    Please read over the following fact sheets that you were given. Coughing and Deep Breathing, MRSA Information and Surgical Site Infection  Prevention

## 2018-02-10 ENCOUNTER — Other Ambulatory Visit: Payer: Self-pay

## 2018-02-10 ENCOUNTER — Encounter (HOSPITAL_COMMUNITY): Payer: Self-pay

## 2018-02-10 ENCOUNTER — Encounter (HOSPITAL_COMMUNITY)
Admission: RE | Admit: 2018-02-10 | Discharge: 2018-02-10 | Disposition: A | Discharge status: Home / Self Care | Payer: Medicare Other | Source: Ambulatory Visit | Attending: Neurosurgery | Admitting: Neurosurgery

## 2018-02-10 DIAGNOSIS — Z01812 Encounter for preprocedural laboratory examination: Secondary | ICD-10-CM | POA: Diagnosis present

## 2018-02-10 HISTORY — DX: Anxiety disorder, unspecified: F41.9

## 2018-02-10 HISTORY — DX: Personal history of urinary calculi: Z87.442

## 2018-02-10 HISTORY — DX: Major depressive disorder, single episode, unspecified: F32.9

## 2018-02-10 HISTORY — DX: Cardiac murmur, unspecified: R01.1

## 2018-02-10 HISTORY — DX: Cerebral infarction, unspecified: I63.9

## 2018-02-10 HISTORY — DX: Depression, unspecified: F32.A

## 2018-02-10 LAB — SURGICAL PCR SCREEN
MRSA, PCR: NEGATIVE
Staphylococcus aureus: POSITIVE — AB

## 2018-02-10 LAB — BASIC METABOLIC PANEL
Anion gap: 7 (ref 5–15)
BUN: 20 mg/dL (ref 8–23)
CO2: 27 mmol/L (ref 22–32)
Calcium: 9 mg/dL (ref 8.9–10.3)
Chloride: 105 mmol/L (ref 98–111)
Creatinine, Ser: 0.62 mg/dL (ref 0.61–1.24)
GFR calc Af Amer: >60 mL/min (ref >60)
GFR calc non Af Amer: >60 mL/min (ref >60)
Glucose, Bld: 96 mg/dL (ref 70–99)
Potassium: 4.8 mmol/L (ref 3.5–5.1)
Sodium: 139 mmol/L (ref 135–145)

## 2018-02-10 LAB — CBC
HCT: 44.9 % (ref 39.0–52.0)
Hemoglobin: 14.3 g/dL (ref 13.0–17.0)
MCH: 31.8 pg (ref 26.0–34.0)
MCHC: 31.8 g/dL (ref 30.0–36.0)
MCV: 100 fL (ref 78.0–100.0)
Platelets: 164 10*3/uL (ref 150–400)
RBC: 4.49 MIL/uL (ref 4.22–5.81)
RDW: 13.2 % (ref 11.5–15.5)
WBC: 3.9 10*3/uL — ABNORMAL LOW (ref 4.0–10.5)

## 2018-02-10 LAB — TYPE AND SCREEN
ABO/RH(D): A POS
Antibody Screen: NEGATIVE

## 2018-02-10 LAB — ABO/RH: ABO/RH(D): A POS

## 2018-02-10 NOTE — Progress Notes (Signed)
PATIENT STATED HE WOULD TAKE HIS LAST DOSE OF PLAVIX Friday 02/13/18.

## 2018-02-10 NOTE — Progress Notes (Signed)
   02/10/18 0830  OBSTRUCTIVE SLEEP APNEA  Have you ever been diagnosed with sleep apnea through a sleep study? No  Do you snore loudly (loud enough to be heard through closed doors)?  1  Do you often feel tired, fatigued, or sleepy during the daytime (such as falling asleep during driving or talking to someone)? 0  Has anyone observed you stop breathing during your sleep? 1  Do you have, or are you being treated for high blood pressure? 0  BMI more than 35 kg/m2? 0  Age > 50 (1-yes) 1  Neck circumference greater than:Male 16 inches or larger, Male 17inches or larger? 1  Male Gender (Yes=1) 1  Obstructive Sleep Apnea Score 5  Score 5 or greater  Results sent to PCP

## 2018-02-10 NOTE — H&P (Signed)
Patient ID:   [id number] Patient: Nicholas Wise  Date of Birth: 1945-09-21 Visit Type: Office Visit   Date: 12/10/2017 08:30 AM Provider: Marchia Meiers. Vertell Limber MD   This 72 year old male presents for numbness.  HISTORY OF PRESENT ILLNESS:  1.  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  He was last seen in 2016 following ACDF.  He reports over the past month, low back pain with standing or walking, pain into the right leg with walking distance (500 ft), and numbness and tingling in the right leg upon standing.  Symptoms have increased causing him to stop all yd work.   The patient would rate his pain 0/10 when sitting but 8/10 when walking and 10/10 when walking long distances. Leaning forward offers immediate relief.   Tylenol taken as needed  Plavix continues following TIA in 2015  History:  Cholesterol, BPH, gout, hiatal hernia, TIA 2015 (PLAVIX) Surgical history:  Right knee scope 1999, ACDF 2015  X-rays and MRI on [place]           PAST MEDICAL/SURGICAL HISTORY   (Detailed)  Disease/disorder Onset Date Management Date Comments    Surgery, cervical spine 2015     Tonsillectomy 1961   Arthritis      BPH - benign prostatic hyperplasia        Arthroscopy knee    Gout      Hiatal hernia      Stroke      Elevated lipids         Family History:  (Detailed) Relationship Family Member Name Deceased Age at Death Condition Onset Age Cause of Death      Family history of Stroke  N  Father    Hypertension  N  Father    Diabetes mellitus  N  Mother    Congestive heart failure  N     Social History:  (Detailed) Tobacco use reviewed. Preferred language is Unknown.   Tobacco use status: Never smoked tobacco. Smoking status: Never smoker.  SMOKING STATUS Type Smoking Status Usage Per Day Years Used Total Pack Years   Never smoker          MEDICATIONS: (added, continued or stopped this visit) Started  Medication Directions Instruction Stopped   allopurinol 300 mg tablet take 1 tablet by oral route  every day     aspirin 81 mg tablet,delayed release take 1 tablet by oral route  every day  12/10/2017   atorvastatin 40 mg tablet take 1 tablet by oral route  every day     clopidogrel 75 mg tablet take 1 tablet by oral route  every day     finasteride 5 mg tablet take 1 tablet by oral route  every day     Flonase 50 mcg/actuation nasal spray,suspension inhale 1 spray by intranasal route 2 times every day in each nostril  12/10/2017   Nasacort 55 mcg nasal spray aerosol      potassium citrate ER 10 mEq (1,080 mg) tablet,extended release take 1 tablet by oral route 2 times every day     sertraline 100 mg tablet take 1 tablet by oral route  every day     tamsulosin ER 0.4 mg capsule,extended release 24 hr take 1 capsule by oral route 2 times every day    01/03/2014 Valium 10 mg tablet take one PO prior to procedure  12/10/2017     ALLERGIES: Ingredient Reaction Medication Name Comment  NO KNOWN ALLERGIES     No known allergies. Reviewed, no changes.   REVIEW OF SYSTEMS   See scanned patient registration form, dated, signed and dated on   Review of Systems Details System Neg/Pos Details  Constitutional Negative Chills, Fatigue, Fever, Malaise, Night sweats, Weight gain and Weight loss.  ENMT Negative Ear drainage, Hearing loss, Nasal drainage, Otalgia, Sinus pressure and Sore throat.  Eyes Negative Eye discharge, Eye pain and Vision changes.  Respiratory Negative Chronic cough, Cough, Dyspnea, Known TB exposure and Wheezing.  Cardio Negative Chest pain, Claudication, Edema and Irregular heartbeat/palpitations.  GI Negative Abdominal pain, Blood in stool, Change in stool pattern, Constipation, Decreased appetite, Diarrhea, Heartburn, Nausea and Vomiting.  GU Negative Dribbling, Dysuria, Erectile dysfunction, Hematuria, Polyuria (Genitourinary), Slow stream, Urinary frequency, Urinary  incontinence and Urinary retention.  Endocrine Negative Cold intolerance, Heat intolerance, Polydipsia and Polyphagia.  Neuro Positive Numbness in extremity.  Psych Negative Anxiety, Depression and Insomnia.  Integumentary Negative Brittle hair, Brittle nails, Change in shape/size of mole(s), Hair loss, Hirsutism, Hives, Pruritus, Rash and Skin lesion.  MS Positive Back pain.  Hema/Lymph Negative Easy bleeding, Easy bruising and Lymphadenopathy.  Allergic/Immuno Negative Contact allergy, Environmental allergies, Food allergies and Seasonal allergies.  Reproductive Negative Penile discharge and Sexual dysfunction.   PHYSICAL EXAM:   Vitals Date Temp F BP Pulse Ht In Wt Lb BMI BSA Pain Score  12/10/2017  134/86 65 69 174 25.7  0/10    PHYSICAL EXAM Details General Level of Distress: no acute distress Overall Appearance: normal  Strider and Face  Right Left  Fundoscopic Exam:  normal normal    Cardiovascular Cardiac: regular rate and rhythm without murmur  Right Left  Carotid Pulses: normal normal  Respiratory Lungs: clear to auscultation  Neurological Orientation: normal Recent and Remote Memory: normal Attention Span and Concentration:   normal Language: normal Fund of Knowledge: normal  Right Left Sensation: normal normal Upper Extremity Coordination: normal normal  Lower Extremity Coordination: normal normal  Musculoskeletal Gait and Station: normal  Right Left Upper Extremity Muscle Strength: normal normal Lower Extremity Muscle Strength: normal normal Upper Extremity Muscle Tone:  normal normal Lower Extremity Muscle Tone: normal normal   Motor Strength Upper and lower extremity motor strength was tested in the clinically pertinent muscles. Any abnormal findings will be noted below.   Right Left Hip Flexor: 5/5 5/5 EHL: 4/5 5/5   Deep Tendon  Reflexes  Right Left Biceps: normal normal Triceps: normal normal Brachioradialis: normal normal Patellar: normal normal Achilles: normal normal  Cranial Nerves II. Optic Nerve/Visual Fields: normal III. Oculomotor: normal IV. Trochlear: normal V. Trigeminal: normal VI. Abducens: normal VII. Facial: normal VIII. Acoustic/Vestibular: normal IX. Glossopharyngeal: normal X. Vagus: normal XI. Spinal Accessory: normal XII. Hypoglossal: normal  Motor and other Tests Lhermittes: negative Rhomberg: negative Pronator drift: absent     Right Left Hoffman's: normal normal Clonus: normal normal Babinski: normal normal SLR: negative negative   Additional Findings:   systolic ejection murmur   DIAGNOSTIC RESULTS:   MRI (11/16/2017) 1. Multilevel degenerative changes of the lumbar spine as described above, with the symptomatic level likely at L4-5 where there is severe central spinal canal and right neuroforaminal stenosis due to disc degeneration and grade 1 anterolisthesis secondary to severe facet arthropathy. 2. Additional mild central spinal canal stenosis at L1-2 and L2-3.      IMPRESSION:   Lumbar MRI reveals mild stenosis at L3-4, worse on left and arthritis and severe stenosis at L4-5. AP x-ray  reveals dextroconvex scoliosis, lateral x-ray shows anterolisthesis of L4-5 (10 mm on extension, 9 mm on flexion, 9 mm on neutral) and mild retrolisthesis of L3-4 and L2-3. The patient's physical exam reveals 4/5 EHL on right, left EHL full, systolic ejection murmur, negative SLR bilaterally, hip abductor strength is full, equal sensations and normal UE strength.    I would recommend the patient visit a cardiologist for his systolic ejection murmur. Due to patient's dextroconvex scoliosis and current symptoms I would recommend scoliosis x-rays and a left L4-5 XLIF with percutaneous pedicle screws pending cardiologist approval.   Patient is on an anti-coagulant, anti-inflammatory  or supplement that may increase bleeding time. Patient advised to stop medicine prior to surgery.  Comments:  Patient will need clearance to stop Plavix prior to surgery.  PLAN:  ordered scoliosis x-rays. scheduled left L4-5 XLIF with percutaneous pedicle screws pending cardiologist approval.   Orders: Diagnostic Procedures: Assessment Procedure  M41.20 Scoliosis- AP/Lat  Instruction(s)/Education: Assessment Instruction  R03.0 Hypertension education  Z68.25 Dietary management education, guidance, and counseling  Miscellaneous: Assessment   M48.062 LSO Brace   Completed Orders (this encounter) Order Details Reason Side Interpretation Result Initial Treatment Date Region  Scoliosis- AP/Lat      12/10/2017 All Levels to All Levels  Hypertension education Continue to monitor blood pressure. If blood pressure remains elevated contact primary care provider        Dietary management education, guidance, and counseling patient encouraged to eat a well balanced diet         Assessment/Plan   # Detail Type Description   1. Assessment Radiculopathy, lumbar region (M54.16).       2. 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       3. Assessment Scoliosis (and kyphoscoliosis), idiopathic (M41.20).       4. 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   Plan Orders LSO Brace.       5. Assessment Systolic ejection murmur (R01.1).   Plan Orders Cardiology Referral for Consult Dr. Haroldine Laws    .       6. Assessment Elevated blood-pressure reading, w/o diagnosis of htn (R03.0).       7. Assessment Body mass index (BMI) 25.0-25.9, adult (O12.24).   Plan Orders Today's instructions / counseling include(s) Dietary management education, guidance, and counseling.         Pain Management Plan Pain Scale: 0/10. Method: Numeric Pain Intensity Scale. Onset: 09/11/2017.              Provider:  Vertell Limber MD, Marchia Meiers 12/10/2017 10:02 AM  Dictation edited  by: Dionne Bucy    CC Providers: Dellia Nims [id number] [place]  [id number]   Erline Levine MD  [address] [place], [place] [id number]              Electronically signed by Marchia Meiers. Vertell Limber MD on 12/11/2017 08:46 AM

## 2018-02-12 NOTE — Progress Notes (Signed)
Anesthesia Chart Review:  Case:  [id number] Date/Time:  02/20/18 1100   Procedures:      Left Lumbar 4-5 Anterolateral lumbar interbody fusion with percutaneous pedicle screw (Left ) - Left Lumbar 4-5 Anterolateral lumbar interbody fusion with percutaneous pedicle screw     LUMBAR PERCUTANEOUS PEDICLE SCREW 1 LEVEL (N/A )   Anesthesia type:  General   Pre-op diagnosis:  Lumbar stenosis with neurogenic claudication   Location:  [facility] OR ROOM 21 / [place] [facility]   Surgeon:  Erline Levine, MD      DISCUSSION: 72 yo male never smoker. Pertinent hx includes PONV, TIA, Anxiety, Depression, Numbness and tingling in left hand, Hiatal hernia, Heart murmur.  Pt was seen by cardiology Dr. Larae Grooms for preop clearance 01/05/2018. Per his note: 1. Murmur: Plan for echo. Sounds like aortic valve murmur.  I doubt it is severe, but would like to evaluate before surgery. 2. Preoperative cardiovascular exam: Plan for echo.  No ischemia testing needed at this time. OK to hold Plavix 5 days prior to surgery.  Echo done 01/05/2018 showed moderate AS, cleared by Dr. Irish Lack. See below for full results.  Last dose Plavix 02/13/2018.  Anticipate he can proceed with surgery as scheduled barring acute status change.  VS: BP (!) 153/77   Pulse (!) 58   Temp 36.4 C   Resp 20   Ht 5\' 10"  (1.778 m)   Wt 177 lb 3.2 oz (80.4 kg)   SpO2 95%   BMI 25.43 kg/m   PROVIDERS: Josem Kaufmann, MD is PCP  Larae Grooms, MD is Cardiologist last seen 01/05/2018  LABS: Labs reviewed: Acceptable for surgery. (all labs ordered are listed, but only abnormal results are displayed)  Labs Reviewed  SURGICAL PCR SCREEN - Abnormal; Notable for the following components:      Result Value   Staphylococcus aureus POSITIVE (*)    All other components within normal limits  CBC - Abnormal; Notable for the following components:   WBC 3.9 (*)    All other components within normal limits  BASIC METABOLIC PANEL  TYPE AND SCREEN   ABO/RH     IMAGES: CHEST  2 VIEW 04/01/2014  COMPARISON:  None.  FINDINGS: Cardiomediastinal silhouette is unremarkable. No acute infiltrate or pleural effusion. No pulmonary edema. Mild degenerative changes thoracic spine.  IMPRESSION: No active cardiopulmonary disease.   EKG: 01/05/2018: NSR, RBBB   CV: Echo 01/05/2018: Study Conclusions  - Left ventricle: The cavity size was normal. Wall thickness was   normal. Systolic function was normal. The estimated ejection   fraction was in the range of 60% to 65%. Wall motion was normal;   there were no regional wall motion abnormalities. Doppler   parameters are consistent with abnormal left ventricular   relaxation (grade 1 diastolic dysfunction). - Aortic valve: Trileaflet; severely thickened, severely calcified   leaflets. Valve mobility was restricted. There was moderate   stenosis. Peak velocity (S): 312 cm/s. Mean gradient (S): 22 mm   Hg. Peak gradient (S): 39 mm Hg. Valve area (Vmax): 1.24 cm^2.   Past Medical History:  Diagnosis Date  . Anxiety   . Arthritis   . Depression   . Heart murmur   . Hemorrhoids   . Hiatal hernia   . High cholesterol   . History of kidney stones   . Hx of adenomatous polyp of colon 01/31/2017  . Numbness and tingling in left hand   . PONV (postoperative nausea and vomiting)  bladder spasms went home after neck surgery w/ catheter  . Stroke ([place])    Tia   . TIA (transient ischemic attack)   . Urinary frequency     Past Surgical History:  Procedure Laterality Date  . ANTERIOR CERVICAL DECOMP/DISCECTOMY FUSION N/A 04/11/2014   Procedure: ANTERIOR CERVICAL DECOMPRESSION/DISCECTOMY FUSION CERVICAL THREE-FOUR,CERVICAL FOUR-FIVE,CERVICAL FIVE-SIX;  Surgeon: Erline Levine, MD;  Location: [place] [facility];  Service: Neurosurgery;  Laterality: N/A;  . COLONOSCOPY    . KNEE SURGERY Right   . TONSILLECTOMY      MEDICATIONS: . acetaminophen (TYLENOL) 500 MG tablet  . allopurinol  (ZYLOPRIM) 300 MG tablet  . ALPRAZolam (XANAX) 0.5 MG tablet  . atorvastatin (LIPITOR) 40 MG tablet  . cetirizine (ZYRTEC) 10 MG tablet  . clopidogrel (PLAVIX) 75 MG tablet  . finasteride (PROSCAR) 5 MG tablet  . Omega-3 Fatty Acids (FISH OIL) 1000 MG CAPS  . Polyethyl Glycol-Propyl Glycol (SYSTANE OP)  . potassium citrate (UROCIT-K) 10 MEQ (1080 MG) SR tablet  . sertraline (ZOLOFT) 100 MG tablet  . tamsulosin (FLOMAX) 0.4 MG CAPS capsule  . triamcinolone (NASACORT) 55 MCG/ACT AERO nasal inhaler   . 0.9 %  sodium chloride infusion     Wynonia Musty [facility]/Anesthesiology Phone [phone] 02/12/2018 3:57 PM

## 2018-02-20 ENCOUNTER — Encounter (HOSPITAL_COMMUNITY): Payer: Self-pay | Admitting: Surgery

## 2018-02-20 ENCOUNTER — Inpatient Hospital Stay (HOSPITAL_COMMUNITY): Payer: Medicare Other

## 2018-02-20 ENCOUNTER — Encounter (HOSPITAL_COMMUNITY)
Admission: RE | Disposition: A | Discharge status: Home / Self Care | Payer: Self-pay | Source: Ambulatory Visit | Attending: Neurosurgery

## 2018-02-20 ENCOUNTER — Inpatient Hospital Stay (HOSPITAL_COMMUNITY): Payer: Medicare Other | Admitting: Physician Assistant

## 2018-02-20 ENCOUNTER — Inpatient Hospital Stay (HOSPITAL_COMMUNITY): Payer: Medicare Other | Admitting: Certified Registered"

## 2018-02-20 ENCOUNTER — Other Ambulatory Visit: Payer: Self-pay

## 2018-02-20 ENCOUNTER — Inpatient Hospital Stay (HOSPITAL_COMMUNITY)
Admission: RE | Admit: 2018-02-20 | Discharge: 2018-02-21 | DRG: 460 | Disposition: A | Discharge status: Home / Self Care | Payer: Medicare Other | Source: Ambulatory Visit | Attending: Neurosurgery | Admitting: Neurosurgery

## 2018-02-20 DIAGNOSIS — Z1211 Encounter for screening for malignant neoplasm of colon: Secondary | ICD-10-CM

## 2018-02-20 DIAGNOSIS — R030 Elevated blood-pressure reading, without diagnosis of hypertension: Secondary | ICD-10-CM | POA: Diagnosis present

## 2018-02-20 DIAGNOSIS — R011 Cardiac murmur, unspecified: Secondary | ICD-10-CM | POA: Diagnosis present

## 2018-02-20 DIAGNOSIS — M48062 Spinal stenosis, lumbar region with neurogenic claudication: Principal | ICD-10-CM | POA: Diagnosis present

## 2018-02-20 DIAGNOSIS — R402250 Coma scale, best verbal response, oriented, unspecified time: Secondary | ICD-10-CM | POA: Diagnosis present

## 2018-02-20 DIAGNOSIS — Z8673 Personal history of transient ischemic attack (TIA), and cerebral infarction without residual deficits: Secondary | ICD-10-CM | POA: Diagnosis not present

## 2018-02-20 DIAGNOSIS — M4120 Other idiopathic scoliosis, site unspecified: Secondary | ICD-10-CM | POA: Diagnosis present

## 2018-02-20 DIAGNOSIS — Z981 Arthrodesis status: Secondary | ICD-10-CM | POA: Diagnosis not present

## 2018-02-20 DIAGNOSIS — M109 Gout, unspecified: Secondary | ICD-10-CM | POA: Diagnosis present

## 2018-02-20 DIAGNOSIS — M419 Scoliosis, unspecified: Secondary | ICD-10-CM | POA: Diagnosis present

## 2018-02-20 DIAGNOSIS — R402140 Coma scale, eyes open, spontaneous, unspecified time: Secondary | ICD-10-CM | POA: Diagnosis present

## 2018-02-20 DIAGNOSIS — M5116 Intervertebral disc disorders with radiculopathy, lumbar region: Secondary | ICD-10-CM | POA: Diagnosis present

## 2018-02-20 DIAGNOSIS — Z7902 Long term (current) use of antithrombotics/antiplatelets: Secondary | ICD-10-CM | POA: Diagnosis not present

## 2018-02-20 DIAGNOSIS — N400 Enlarged prostate without lower urinary tract symptoms: Secondary | ICD-10-CM | POA: Diagnosis present

## 2018-02-20 DIAGNOSIS — R402360 Coma scale, best motor response, obeys commands, unspecified time: Secondary | ICD-10-CM | POA: Diagnosis present

## 2018-02-20 DIAGNOSIS — M4316 Spondylolisthesis, lumbar region: Secondary | ICD-10-CM | POA: Diagnosis present

## 2018-02-20 DIAGNOSIS — Z79899 Other long term (current) drug therapy: Secondary | ICD-10-CM | POA: Diagnosis not present

## 2018-02-20 DIAGNOSIS — Z419 Encounter for procedure for purposes other than remedying health state, unspecified: Secondary | ICD-10-CM

## 2018-02-20 HISTORY — PX: ANTERIOR LAT LUMBAR FUSION: SHX1168

## 2018-02-20 HISTORY — PX: LUMBAR PERCUTANEOUS PEDICLE SCREW 1 LEVEL: SHX5560

## 2018-02-20 SURGERY — ANTERIOR LATERAL LUMBAR FUSION 1 LEVEL
Anesthesia: General | Site: Flank

## 2018-02-20 MED ORDER — CEFAZOLIN SODIUM-DEXTROSE 2-4 GM/100ML-% IV SOLN
2.0000 g | Freq: Three times a day (TID) | INTRAVENOUS | Status: AC
  Administered 2018-02-20 – 2018-02-21 (×2): 2 g via INTRAVENOUS
  Filled 2018-02-20 (×2): qty 100

## 2018-02-20 MED ORDER — MIDAZOLAM HCL 5 MG/5ML IJ SOLN
INTRAMUSCULAR | Status: DC | PRN
  Administered 2018-02-20: 2 mg via INTRAVENOUS

## 2018-02-20 MED ORDER — SODIUM CHLORIDE 0.9 % IV SOLN: 250.0000 mL | INTRAVENOUS | Status: DC

## 2018-02-20 MED ORDER — SERTRALINE HCL 50 MG PO TABS
100.0000 mg | ORAL_TABLET | Freq: Every day | ORAL | Status: DC
  Administered 2018-02-20: 100 mg via ORAL
  Filled 2018-02-20: qty 2

## 2018-02-20 MED ORDER — ALPRAZOLAM 0.5 MG PO TABS: 0.5000 mg | ORAL_TABLET | Freq: Every day | ORAL | Status: DC | PRN

## 2018-02-20 MED ORDER — FENTANYL CITRATE (PF) 100 MCG/2ML IJ SOLN
25.0000 ug | INTRAMUSCULAR | Status: DC | PRN
  Administered 2018-02-20: 50 ug via INTRAVENOUS
  Administered 2018-02-20 (×2): 25 ug via INTRAVENOUS

## 2018-02-20 MED ORDER — MORPHINE SULFATE (PF) 2 MG/ML IV SOLN: 2.0000 mg | INTRAVENOUS | Status: DC | PRN

## 2018-02-20 MED ORDER — PROPOFOL 10 MG/ML IV BOLUS
INTRAVENOUS | Status: DC | PRN
  Administered 2018-02-20: 130 mg via INTRAVENOUS

## 2018-02-20 MED ORDER — LIDOCAINE-EPINEPHRINE 1 %-1:100000 IJ SOLN
INTRAMUSCULAR | Status: AC
  Filled 2018-02-20: qty 1

## 2018-02-20 MED ORDER — METHOCARBAMOL 500 MG PO TABS
ORAL_TABLET | ORAL | Status: AC
  Filled 2018-02-20: qty 1

## 2018-02-20 MED ORDER — BUPIVACAINE LIPOSOME 1.3 % IJ SUSP
20.0000 mL | INTRAMUSCULAR | Status: AC
  Administered 2018-02-20: 20 mL
  Filled 2018-02-20: qty 20

## 2018-02-20 MED ORDER — POTASSIUM CITRATE ER 10 MEQ (1080 MG) PO TBCR
10.0000 meq | EXTENDED_RELEASE_TABLET | Freq: Two times a day (BID) | ORAL | Status: DC
  Administered 2018-02-20: 10 meq via ORAL
  Filled 2018-02-20 (×2): qty 1

## 2018-02-20 MED ORDER — CHLORHEXIDINE GLUCONATE CLOTH 2 % EX PADS: 6.0000 | MEDICATED_PAD | Freq: Once | CUTANEOUS | Status: DC

## 2018-02-20 MED ORDER — METHOCARBAMOL 500 MG PO TABS
500.0000 mg | ORAL_TABLET | Freq: Four times a day (QID) | ORAL | Status: DC | PRN
  Administered 2018-02-20 – 2018-02-21 (×3): 500 mg via ORAL
  Filled 2018-02-20 (×2): qty 1

## 2018-02-20 MED ORDER — SODIUM CHLORIDE 0.9 % IV SOLN: 500.0000 mL | INTRAVENOUS | Status: DC

## 2018-02-20 MED ORDER — ACETAMINOPHEN 325 MG PO TABS: 650.0000 mg | ORAL_TABLET | ORAL | Status: DC | PRN

## 2018-02-20 MED ORDER — ONDANSETRON HCL 4 MG/2ML IJ SOLN: 4.0000 mg | Freq: Four times a day (QID) | INTRAMUSCULAR | Status: DC | PRN

## 2018-02-20 MED ORDER — SUFENTANIL CITRATE 50 MCG/ML IV SOLN
INTRAVENOUS | Status: AC
  Filled 2018-02-20: qty 1

## 2018-02-20 MED ORDER — FAMOTIDINE 20 MG PO TABS
20.0000 mg | ORAL_TABLET | Freq: Two times a day (BID) | ORAL | Status: DC
  Administered 2018-02-20: 20 mg via ORAL
  Filled 2018-02-20: qty 1

## 2018-02-20 MED ORDER — TAMSULOSIN HCL 0.4 MG PO CAPS
0.4000 mg | ORAL_CAPSULE | Freq: Two times a day (BID) | ORAL | Status: DC
  Administered 2018-02-20: 0.4 mg via ORAL
  Filled 2018-02-20: qty 1

## 2018-02-20 MED ORDER — BUPIVACAINE HCL (PF) 0.5 % IJ SOLN
INTRAMUSCULAR | Status: AC
  Filled 2018-02-20: qty 30

## 2018-02-20 MED ORDER — PROPOFOL 10 MG/ML IV BOLUS
INTRAVENOUS | Status: AC
  Filled 2018-02-20: qty 40

## 2018-02-20 MED ORDER — OXYCODONE HCL 5 MG PO TABS
ORAL_TABLET | ORAL | Status: AC
  Filled 2018-02-20: qty 1

## 2018-02-20 MED ORDER — SODIUM CHLORIDE 0.9 % IV SOLN: INTRAVENOUS | Status: DC | PRN

## 2018-02-20 MED ORDER — METHOCARBAMOL 1000 MG/10ML IJ SOLN
500.0000 mg | Freq: Four times a day (QID) | INTRAVENOUS | Status: DC | PRN
  Filled 2018-02-20: qty 5

## 2018-02-20 MED ORDER — SUCCINYLCHOLINE CHLORIDE 20 MG/ML IJ SOLN
INTRAMUSCULAR | Status: DC | PRN
  Administered 2018-02-20: 120 mg via INTRAVENOUS

## 2018-02-20 MED ORDER — ONDANSETRON HCL 4 MG/2ML IJ SOLN
INTRAMUSCULAR | Status: DC | PRN
  Administered 2018-02-20: 4 mg via INTRAVENOUS

## 2018-02-20 MED ORDER — HEMOSTATIC AGENTS (NO CHARGE) OPTIME
TOPICAL | Status: DC | PRN
  Administered 2018-02-20: 1

## 2018-02-20 MED ORDER — 0.9 % SODIUM CHLORIDE (POUR BTL) OPTIME
TOPICAL | Status: DC | PRN
  Administered 2018-02-20: 1000 mL

## 2018-02-20 MED ORDER — TRIAMCINOLONE ACETONIDE 55 MCG/ACT NA AERO
1.0000 | INHALATION_SPRAY | Freq: Every day | NASAL | Status: DC
  Filled 2018-02-20: qty 10.8

## 2018-02-20 MED ORDER — BUPIVACAINE HCL (PF) 0.5 % IJ SOLN
INTRAMUSCULAR | Status: DC | PRN
  Administered 2018-02-20: 4 mL
  Administered 2018-02-20: 5 mL

## 2018-02-20 MED ORDER — LIDOCAINE-EPINEPHRINE 1 %-1:100000 IJ SOLN
INTRAMUSCULAR | Status: DC | PRN
  Administered 2018-02-20: 4 mL
  Administered 2018-02-20: 5 mL

## 2018-02-20 MED ORDER — SUFENTANIL CITRATE 50 MCG/ML IV SOLN
INTRAVENOUS | Status: DC | PRN
  Administered 2018-02-20: 10 ug via INTRAVENOUS
  Administered 2018-02-20 (×3): 5 ug via INTRAVENOUS

## 2018-02-20 MED ORDER — FENTANYL CITRATE (PF) 100 MCG/2ML IJ SOLN: INTRAMUSCULAR | Status: DC | PRN

## 2018-02-20 MED ORDER — OXYCODONE HCL 5 MG PO TABS
10.0000 mg | ORAL_TABLET | ORAL | Status: DC | PRN
  Administered 2018-02-21: 10 mg via ORAL
  Filled 2018-02-20 (×2): qty 2

## 2018-02-20 MED ORDER — LORATADINE 10 MG PO TABS
10.0000 mg | ORAL_TABLET | Freq: Every day | ORAL | Status: DC
Start: 2018-02-20 — End: 2018-02-21

## 2018-02-20 MED ORDER — FLEET ENEMA 7-19 GM/118ML RE ENEM: 1.0000 | ENEMA | Freq: Once | RECTAL | Status: DC | PRN

## 2018-02-20 MED ORDER — SODIUM CHLORIDE 0.9 % IV SOLN
INTRAVENOUS | Status: DC | PRN
  Administered 2018-02-20: 50 ug/min via INTRAVENOUS
  Administered 2018-02-20: 35 ug/min via INTRAVENOUS

## 2018-02-20 MED ORDER — ACETAMINOPHEN 500 MG PO TABS: 500.0000 mg | ORAL_TABLET | ORAL | Status: DC | PRN

## 2018-02-20 MED ORDER — BISACODYL 10 MG RE SUPP: 10.0000 mg | Freq: Every day | RECTAL | Status: DC | PRN

## 2018-02-20 MED ORDER — ACETAMINOPHEN 650 MG RE SUPP: 650.0000 mg | RECTAL | Status: DC | PRN

## 2018-02-20 MED ORDER — MIDAZOLAM HCL 2 MG/2ML IJ SOLN
INTRAMUSCULAR | Status: AC
  Filled 2018-02-20: qty 2

## 2018-02-20 MED ORDER — LACTATED RINGERS IV SOLN
INTRAVENOUS | Status: DC | PRN
  Administered 2018-02-20 (×2): via INTRAVENOUS

## 2018-02-20 MED ORDER — MENTHOL 3 MG MT LOZG: 1.0000 | LOZENGE | OROMUCOSAL | Status: DC | PRN

## 2018-02-20 MED ORDER — ALUM & MAG HYDROXIDE-SIMETH 200-200-20 MG/5ML PO SUSP: 30.0000 mL | Freq: Four times a day (QID) | ORAL | Status: DC | PRN

## 2018-02-20 MED ORDER — THROMBIN 5000 UNITS EX SOLR
CUTANEOUS | Status: AC
  Filled 2018-02-20: qty 10000

## 2018-02-20 MED ORDER — ALLOPURINOL 300 MG PO TABS
300.0000 mg | ORAL_TABLET | Freq: Every day | ORAL | Status: DC
  Filled 2018-02-20 (×2): qty 1

## 2018-02-20 MED ORDER — SODIUM CHLORIDE 0.9% FLUSH: 3.0000 mL | INTRAVENOUS | Status: DC | PRN

## 2018-02-20 MED ORDER — THROMBIN 5000 UNITS EX SOLR
CUTANEOUS | Status: DC | PRN
  Administered 2018-02-20 (×2): 5000 [IU] via TOPICAL

## 2018-02-20 MED ORDER — FENTANYL CITRATE (PF) 100 MCG/2ML IJ SOLN
INTRAMUSCULAR | Status: AC
  Filled 2018-02-20: qty 2

## 2018-02-20 MED ORDER — FAMOTIDINE IN NACL 20-0.9 MG/50ML-% IV SOLN: 20.0000 mg | Freq: Two times a day (BID) | INTRAVENOUS | Status: DC

## 2018-02-20 MED ORDER — ZOLPIDEM TARTRATE 5 MG PO TABS: 5.0000 mg | ORAL_TABLET | Freq: Every evening | ORAL | Status: DC | PRN

## 2018-02-20 MED ORDER — ONDANSETRON HCL 4 MG PO TABS: 4.0000 mg | ORAL_TABLET | Freq: Four times a day (QID) | ORAL | Status: DC | PRN

## 2018-02-20 MED ORDER — OXYCODONE HCL 5 MG PO TABS
5.0000 mg | ORAL_TABLET | ORAL | Status: DC | PRN
  Administered 2018-02-20 – 2018-02-21 (×4): 5 mg via ORAL
  Filled 2018-02-20 (×3): qty 1

## 2018-02-20 MED ORDER — DOCUSATE SODIUM 100 MG PO CAPS
100.0000 mg | ORAL_CAPSULE | Freq: Two times a day (BID) | ORAL | Status: DC
  Administered 2018-02-20: 100 mg via ORAL
  Filled 2018-02-20: qty 1

## 2018-02-20 MED ORDER — POLYETHYLENE GLYCOL 3350 17 G PO PACK: 17.0000 g | PACK | Freq: Every day | ORAL | Status: DC | PRN

## 2018-02-20 MED ORDER — PHENOL 1.4 % MT LIQD: 1.0000 | OROMUCOSAL | Status: DC | PRN

## 2018-02-20 MED ORDER — FINASTERIDE 5 MG PO TABS
5.0000 mg | ORAL_TABLET | Freq: Every day | ORAL | Status: DC
  Administered 2018-02-20: 5 mg via ORAL
  Filled 2018-02-20: qty 1

## 2018-02-20 MED ORDER — SODIUM CHLORIDE 0.9% FLUSH
3.0000 mL | Freq: Two times a day (BID) | INTRAVENOUS | Status: DC
  Administered 2018-02-20: 3 mL via INTRAVENOUS

## 2018-02-20 MED ORDER — POLYETHYL GLYCOL-PROPYL GLYCOL 0.4-0.3 % OP GEL
Freq: Every day | OPHTHALMIC | Status: DC
  Filled 2018-02-20: qty 10

## 2018-02-20 MED ORDER — PROPOFOL 500 MG/50ML IV EMUL
INTRAVENOUS | Status: DC | PRN
  Administered 2018-02-20: 25 ug/kg/min via INTRAVENOUS

## 2018-02-20 MED ORDER — CEFAZOLIN SODIUM-DEXTROSE 2-4 GM/100ML-% IV SOLN
2.0000 g | INTRAVENOUS | Status: AC
  Administered 2018-02-20: 2 g via INTRAVENOUS
  Filled 2018-02-20: qty 100

## 2018-02-20 MED ORDER — CLOPIDOGREL BISULFATE 75 MG PO TABS
75.0000 mg | ORAL_TABLET | Freq: Every day | ORAL | Status: DC
  Administered 2018-02-20: 75 mg via ORAL
  Filled 2018-02-20: qty 1

## 2018-02-20 MED ORDER — ARTIFICIAL TEARS OPHTHALMIC OINT
TOPICAL_OINTMENT | Freq: Every day | OPHTHALMIC | Status: DC
  Filled 2018-02-20: qty 3.5

## 2018-02-20 MED ORDER — KCL IN DEXTROSE-NACL 20-5-0.45 MEQ/L-%-% IV SOLN: INTRAVENOUS | Status: DC

## 2018-02-20 MED ORDER — ATORVASTATIN CALCIUM 20 MG PO TABS
40.0000 mg | ORAL_TABLET | Freq: Every day | ORAL | Status: DC
Start: 2018-02-20 — End: 2018-02-21
  Administered 2018-02-20: 40 mg via ORAL
  Filled 2018-02-20: qty 2

## 2018-02-20 SURGICAL SUPPLY — 74 items
BLADE CLIPPER SURG (BLADE) IMPLANT
CAGE MODULUS XL 12X18X60 - 10 (Cage) ×3 IMPLANT
CARTRIDGE OIL MAESTRO DRILL (MISCELLANEOUS) IMPLANT
CLIP NEUROVISION LG (CLIP) ×3 IMPLANT
CONT SPEC 4OZ CLIKSEAL STRL BL (MISCELLANEOUS) ×3 IMPLANT
COVER BACK TABLE 60X90IN (DRAPES) ×3 IMPLANT
DECANTER SPIKE VIAL GLASS SM (MISCELLANEOUS) ×6 IMPLANT
DERMABOND ADVANCED (GAUZE/BANDAGES/DRESSINGS) ×4
DERMABOND ADVANCED .7 DNX12 (GAUZE/BANDAGES/DRESSINGS) ×8 IMPLANT
DIFFUSER DRILL AIR PNEUMATIC (MISCELLANEOUS) IMPLANT
DRAPE C-ARM 42X72 X-RAY (DRAPES) ×6 IMPLANT
DRAPE C-ARMOR (DRAPES) ×6 IMPLANT
DRAPE LAPAROTOMY 100X72X124 (DRAPES) ×6 IMPLANT
DRAPE POUCH INSTRU U-SHP 10X18 (DRAPES) ×3 IMPLANT
DRAPE SURG 17X23 STRL (DRAPES) ×3 IMPLANT
DRSG OPSITE POSTOP 3X4 (GAUZE/BANDAGES/DRESSINGS) ×15 IMPLANT
DURAPREP 26ML APPLICATOR (WOUND CARE) ×6 IMPLANT
ELECT REM PT RETURN 9FT ADLT (ELECTROSURGICAL) ×6
ELECTRODE REM PT RTRN 9FT ADLT (ELECTROSURGICAL) ×4 IMPLANT
GAUZE 4X4 16PLY RFD (DISPOSABLE) IMPLANT
GAUZE SPONGE 4X4 12PLY STRL (GAUZE/BANDAGES/DRESSINGS) ×3 IMPLANT
GAUZE SPONGE 4X4 16PLY XRAY LF (GAUZE/BANDAGES/DRESSINGS) ×3 IMPLANT
GLOVE BIO SURGEON STRL SZ8 (GLOVE) ×12 IMPLANT
GLOVE BIOGEL PI IND STRL 7.5 (GLOVE) ×10 IMPLANT
GLOVE BIOGEL PI IND STRL 8 (GLOVE) ×6 IMPLANT
GLOVE BIOGEL PI IND STRL 8.5 (GLOVE) ×10 IMPLANT
GLOVE BIOGEL PI INDICATOR 7.5 (GLOVE) ×5
GLOVE BIOGEL PI INDICATOR 8 (GLOVE) ×3
GLOVE BIOGEL PI INDICATOR 8.5 (GLOVE) ×5
GLOVE ECLIPSE 7.0 STRL STRAW (GLOVE) ×3 IMPLANT
GLOVE ECLIPSE 8.0 STRL XLNG CF (GLOVE) ×12 IMPLANT
GLOVE ECLIPSE 8.5 STRL (GLOVE) ×3 IMPLANT
GLOVE SURG SS PI 7.0 STRL IVOR (GLOVE) ×12 IMPLANT
GOWN STRL REUS W/ TWL LRG LVL3 (GOWN DISPOSABLE) ×4 IMPLANT
GOWN STRL REUS W/ TWL XL LVL3 (GOWN DISPOSABLE) ×12 IMPLANT
GOWN STRL REUS W/TWL 2XL LVL3 (GOWN DISPOSABLE) IMPLANT
GOWN STRL REUS W/TWL LRG LVL3 (GOWN DISPOSABLE) ×2
GOWN STRL REUS W/TWL XL LVL3 (GOWN DISPOSABLE) ×6
GUIDEWIRE NITINOL BEVEL TIP (WIRE) ×12 IMPLANT
KIT BASIN OR (CUSTOM PROCEDURE TRAY) ×3 IMPLANT
KIT DILATOR XLIF 5 (KITS) ×3 IMPLANT
KIT INFUSE XX SMALL 0.7CC (Orthopedic Implant) ×3 IMPLANT
KIT POSITION SURG JACKSON T1 (MISCELLANEOUS) ×3 IMPLANT
KIT SURGICAL ACCESS MAXCESS 4 (KITS) ×3 IMPLANT
KIT TURNOVER KIT B (KITS) ×3 IMPLANT
MARKER SKIN DUAL TIP RULER LAB (MISCELLANEOUS) ×6 IMPLANT
MODULE NVM5 NEXT GEN EMG (NEEDLE) ×3 IMPLANT
NEEDLE HYPO 21X1.5 SAFETY (NEEDLE) ×3 IMPLANT
NEEDLE HYPO 25X1 1.5 SAFETY (NEEDLE) ×3 IMPLANT
NEEDLE I PASS (NEEDLE) ×3 IMPLANT
NS IRRIG 1000ML POUR BTL (IV SOLUTION) ×3 IMPLANT
OIL CARTRIDGE MAESTRO DRILL (MISCELLANEOUS)
PACK LAMINECTOMY NEURO (CUSTOM PROCEDURE TRAY) ×3 IMPLANT
PAD ARMBOARD 7.5X6 YLW CONV (MISCELLANEOUS) ×9 IMPLANT
PATTIES SURGICAL .5 X.5 (GAUZE/BANDAGES/DRESSINGS) IMPLANT
PATTIES SURGICAL .5 X1 (DISPOSABLE) IMPLANT
PATTIES SURGICAL 1X1 (DISPOSABLE) IMPLANT
PUTTY BONE ATTRAX 5CC STRIP (Putty) ×3 IMPLANT
ROD RELINE MAS TI LORD 5.5X40 (Rod) ×6 IMPLANT
SCREW LOCK RELINE 5.5 TULIP (Screw) ×12 IMPLANT
SCREW MAS RELINE 6.5X50 POLY (Screw) ×12 IMPLANT
SPONGE LAP 4X18 RFD (DISPOSABLE) IMPLANT
SPONGE SURGIFOAM ABS GEL SZ50 (HEMOSTASIS) ×3 IMPLANT
STAPLER SKIN PROX WIDE 3.9 (STAPLE) ×3 IMPLANT
SUT VIC AB 1 CT1 18XBRD ANBCTR (SUTURE) ×4 IMPLANT
SUT VIC AB 1 CT1 8-18 (SUTURE) ×2
SUT VIC AB 2-0 CT1 18 (SUTURE) ×12 IMPLANT
SUT VIC AB 3-0 SH 8-18 (SUTURE) ×12 IMPLANT
SYR INSULIN 1ML 31GX6 SAFETY (SYRINGE) IMPLANT
SYRINGE 20CC LL (MISCELLANEOUS) ×3 IMPLANT
TOWEL GREEN STERILE (TOWEL DISPOSABLE) ×3 IMPLANT
TOWEL GREEN STERILE FF (TOWEL DISPOSABLE) ×3 IMPLANT
TRAY FOLEY MTR SLVR 16FR STAT (SET/KITS/TRAYS/PACK) ×3 IMPLANT
WATER STERILE IRR 1000ML POUR (IV SOLUTION) ×3 IMPLANT

## 2018-02-20 NOTE — Anesthesia Preprocedure Evaluation (Addendum)
Anesthesia Evaluation  Patient identified by MRN, date of birth, ID band Patient awake    Reviewed: Allergy & Precautions, NPO status , Patient's Chart, lab work & pertinent test results  History of Anesthesia Complications (+) PONV and MALIGNANT HYPERTHERMIA  Airway Mallampati: II  TM Distance: >3 FB     Dental   Pulmonary neg pulmonary ROS,    breath sounds clear to auscultation       Cardiovascular negative cardio ROS  + Valvular Problems/Murmurs  Rhythm:Regular Rate:Normal     Neuro/Psych    GI/Hepatic Neg liver ROS, hiatal hernia,   Endo/Other  negative endocrine ROS  Renal/GU negative Renal ROS     Musculoskeletal  (+) Arthritis ,   Abdominal   Peds  Hematology   Anesthesia Other Findings   Reproductive/Obstetrics                            Anesthesia Physical Anesthesia Plan  ASA: III  Anesthesia Plan: General   Post-op Pain Management:    Induction: Intravenous  PONV Risk Score and Plan: 3 and Ondansetron, Dexamethasone and Midazolam  Airway Management Planned: Oral ETT  Additional Equipment:   Intra-op Plan:   Post-operative Plan: Possible Post-op intubation/ventilation  Informed Consent: I have reviewed the patients History and Physical, chart, labs and discussed the procedure including the risks, benefits and alternatives for the proposed anesthesia with the patient or authorized representative who has indicated his/her understanding and acceptance.   Dental advisory given  Plan Discussed with: Anesthesiologist and CRNA  Anesthesia Plan Comments:         Anesthesia Quick Evaluation

## 2018-02-20 NOTE — Anesthesia Procedure Notes (Signed)
Procedure Name: Intubation Date/Time: 02/20/2018 12:07 PM Performed by: Eligha Bridegroom, CRNA Pre-anesthesia Checklist: Patient identified, Emergency Drugs available, Suction available, Patient being monitored and Timeout performed Patient Re-evaluated:Patient Re-evaluated prior to induction Oxygen Delivery Method: Circle system utilized Preoxygenation: Pre-oxygenation with 100% oxygen Induction Type: IV induction Ventilation: Mask ventilation without difficulty Grade View: Grade I Tube type: Oral Tube size: 7.5 mm Placement Confirmation: ETT inserted through vocal cords under direct vision,  positive ETCO2 and breath sounds checked- equal and bilateral Secured at: 22 cm Tube secured with: Tape Dental Injury: Teeth and Oropharynx as per pre-operative assessment

## 2018-02-20 NOTE — Interval H&P Note (Signed)
History and Physical Interval Note:  02/20/2018 8:33 AM  Nicholas Wise  has presented today for surgery, with the diagnosis of Lumbar stenosis with neurogenic claudication  The various methods of treatment have been discussed with the patient and family. After consideration of risks, benefits and other options for treatment, the patient has consented to  Procedure(s) with comments: Left Lumbar 4-5 Anterolateral lumbar interbody fusion with percutaneous pedicle screw (Left) - Left Lumbar 4-5 Anterolateral lumbar interbody fusion with percutaneous pedicle screw LUMBAR PERCUTANEOUS PEDICLE SCREW 1 LEVEL (N/A) as a surgical intervention .  The patient's history has been reviewed, patient examined, no change in status, stable for surgery.  I have reviewed the patient's chart and labs.  Questions were answered to the patient's satisfaction.     Khalif Stender D

## 2018-02-20 NOTE — Interval H&P Note (Signed)
History and Physical Interval Note:  02/20/2018 11:33 AM  Nicholas Wise  has presented today for surgery, with the diagnosis of Lumbar stenosis with neurogenic claudication  The various methods of treatment have been discussed with the patient and family. After consideration of risks, benefits and other options for treatment, the patient has consented to  Procedure(s) with comments: Left Lumbar Four-Five Anterolateral lumbar interbody fusion with percutaneous pedicle screw (Left) - Left Lumbar Four-Five Anterolateral lumbar interbody fusion with percutaneous pedicle screw LUMBAR PERCUTANEOUS PEDICLE SCREW 1 LEVEL (N/A) as a surgical intervention .  The patient's history has been reviewed, patient examined, no change in status, stable for surgery.  I have reviewed the patient's chart and labs.  Questions were answered to the patient's satisfaction.     [name]

## 2018-02-20 NOTE — Op Note (Signed)
02/20/2018  3:21 PM  PATIENT:  Nicholas Wise  72 y.o. male  PRE-OPERATIVE DIAGNOSIS:  Lumbar stenosis with neurogenic claudication, scoliosis, herniated lumbar disc, spondylolisthesis, radiculopathy, lumbago L 45 level  POST-OPERATIVE DIAGNOSIS:  Lumbar stenosis with neurogenic claudication, scoliosis, herniated lumbar disc, spondylolisthesis, radiculopathy, lumbago L 45 level  PROCEDURE:  Procedure(s): Left Lumbar Four-Five Anterolateral lumbar interbody fusion (Left) Lumbar Percutaneous Pedicle Screw Placment Lumbar four-five (N/A)  SURGEON:  Surgeon(s) and Role:    Erline Levine, MD - Primary    * Consuella Lose, MD - Assisting  PHYSICIAN ASSISTANT:   ASSISTANTS: Poteat, RN   ANESTHESIA:   general  EBL:  50 mL   BLOOD ADMINISTERED:none  DRAINS: none   LOCAL MEDICATIONS USED:  MARCAINE    and LIDOCAINE   SPECIMEN:  No Specimen  DISPOSITION OF SPECIMEN:  N/A  COUNTS:  YES  TOURNIQUET:  * No tourniquets in log *  DICTATION: Patient is a 72 year old with severe spondylosis stenosis and scoliosis of the lumbar spine. It was elected to take him to surgery for anterolateral decompression and posterior pedicle screw fixation at L 45.  Procedure: Patient was brought to the operating room and placed in a lateral decubitus position on the operative table and using orthogonally projected C-arm fluoroscopy the patient was placed so that the L 45 level were visualized in AP and lateral plane. The patient was then taped into position. The table was flexed so as to expose the L 45 level as the patient has a high iliac crest. Skin was marked along with a posterior finger dissection incision. His flank was then prepped and draped in usual sterile fashion and incisions were made at L 45 level. Posterior finger dissection was made to enter the retroperitoneal space and then subsequently the probe was inserted into the psoas muscle from the left side initially at the L 45 level. After  mapping the neural elements were able to dock the probe per the midpoint of this vertebral level and without indications electrically of too close proximity to the neural tissues. Subsequently the self-retaining tractor was.after sequential dilators were utilized the shim was employed and the interspace was cleared of psoas muscle and then incised. A thorough discectomy was performed. Instruments were used to clear the interspace of disc material. An anterior entry with posterior trajectory was performed to avoid neural elements.   After thorough discectomy was performed and this was performed using AP and lateral fluoroscopy a 12 lordotic by 60 x 18 mm implant was packed with extra extra small BMP and Attrax. This was tamped into position using the slides and its position was confirmed on AP and lateral fluoroscopy.  Hemostasis was assured the wounds were irrigated and closed with interrupted Vicryl sutures.  Sterile occlusive dressings were placed.  Retractor times were:  L 45:  22 minutes.   Patient was then turned into a prone position on the operating table on Jackson table and using AP and lateral fluoroscopy throughout this portion of the procedure, pedicle screws were placed using Reline Nuvasive cannulated percutaneous screws. 2 screws were placed at L 4  and (6.5 x 50 mm) and 2 at L5 (6.5 x 50). 40 mm rod was then affixed to the screw heads and locked down on the screws on the left and 40 mm rod on the right. All connections were then torqued and the Towers were disassembled. The wounds were irrigated and then closed with 1, 2-0 and 3-0 Vicryl stitches. Sterile occlusive dressing  was placed with Dermabond. Long-acting Marcaine was injected. The patient was then extubated in the operating room and taken to recovery in stable and satisfactory condition having tolerated her operation well. Counts were correct at the end of the case.   PLAN OF CARE: Admit to inpatient   PATIENT DISPOSITION:  PACU -  hemodynamically stable.   Delay start of Pharmacological VTE agent (>24hrs) due to surgical blood loss or risk of bleeding: yes

## 2018-02-20 NOTE — Brief Op Note (Signed)
02/20/2018  3:21 PM  PATIENT:  Nicholas Wise  72 y.o. male  PRE-OPERATIVE DIAGNOSIS:  Lumbar stenosis with neurogenic claudication, scoliosis, herniated lumbar disc, spondylolisthesis, radiculopathy, lumbago L 45 level  POST-OPERATIVE DIAGNOSIS:  Lumbar stenosis with neurogenic claudication, scoliosis, herniated lumbar disc, spondylolisthesis, radiculopathy, lumbago L 45 level  PROCEDURE:  Procedure(s): Left Lumbar Four-Five Anterolateral lumbar interbody fusion (Left) Lumbar Percutaneous Pedicle Screw Placment Lumbar four-five (N/A)  SURGEON:  Surgeon(s) and Role:    Erline Levine, MD - Primary    * Consuella Lose, MD - Assisting  PHYSICIAN ASSISTANT:   ASSISTANTS: Poteat, RN   ANESTHESIA:   general  EBL:  50 mL   BLOOD ADMINISTERED:none  DRAINS: none   LOCAL MEDICATIONS USED:  MARCAINE    and LIDOCAINE   SPECIMEN:  No Specimen  DISPOSITION OF SPECIMEN:  N/A  COUNTS:  YES  TOURNIQUET:  * No tourniquets in log *  DICTATION: Patient is a 72 year old with severe spondylosis stenosis and scoliosis of the lumbar spine. It was elected to take him to surgery for anterolateral decompression and posterior pedicle screw fixation at L 45.  Procedure: Patient was brought to the operating room and placed in a lateral decubitus position on the operative table and using orthogonally projected C-arm fluoroscopy the patient was placed so that the L 45 level were visualized in AP and lateral plane. The patient was then taped into position. The table was flexed so as to expose the L 45 level as the patient has a high iliac crest. Skin was marked along with a posterior finger dissection incision. His flank was then prepped and draped in usual sterile fashion and incisions were made at L 45 level. Posterior finger dissection was made to enter the retroperitoneal space and then subsequently the probe was inserted into the psoas muscle from the left side initially at the L 45 level. After  mapping the neural elements were able to dock the probe per the midpoint of this vertebral level and without indications electrically of too close proximity to the neural tissues. Subsequently the self-retaining tractor was.after sequential dilators were utilized the shim was employed and the interspace was cleared of psoas muscle and then incised. A thorough discectomy was performed. Instruments were used to clear the interspace of disc material. An anterior entry with posterior trajectory was performed to avoid neural elements.   After thorough discectomy was performed and this was performed using AP and lateral fluoroscopy a 12 lordotic by 60 x 18 mm implant was packed with extra extra small BMP and Attrax. This was tamped into position using the slides and its position was confirmed on AP and lateral fluoroscopy.  Hemostasis was assured the wounds were irrigated and closed with interrupted Vicryl sutures.  Sterile occlusive dressings were placed.  Retractor times were:  L 45:  22 minutes.   Patient was then turned into a prone position on the operating table on Jackson table and using AP and lateral fluoroscopy throughout this portion of the procedure, pedicle screws were placed using Reline Nuvasive cannulated percutaneous screws. 2 screws were placed at L 4  and (6.5 x 50 mm) and 2 at L5 (6.5 x 50). 40 mm rod was then affixed to the screw heads and locked down on the screws on the left and 40 mm rod on the right. All connections were then torqued and the Towers were disassembled. The wounds were irrigated and then closed with 1, 2-0 and 3-0 Vicryl stitches. Sterile occlusive dressing  was placed with Dermabond. Long-acting Marcaine was injected. The patient was then extubated in the operating room and taken to recovery in stable and satisfactory condition having tolerated her operation well. Counts were correct at the end of the case.   PLAN OF CARE: Admit to inpatient   PATIENT DISPOSITION:  PACU -  hemodynamically stable.   Delay start of Pharmacological VTE agent (>24hrs) due to surgical blood loss or risk of bleeding: yes

## 2018-02-20 NOTE — Plan of Care (Signed)
  Problem: Safety: Goal: Ability to remain free from injury will improve Outcome: Progressing   

## 2018-02-20 NOTE — Transfer of Care (Signed)
Immediate Anesthesia Transfer of Care Note  Patient: Nicholas Wise  Procedure(s) Performed: Left Lumbar Four-Five Anterolateral lumbar interbody fusion (Left Flank) Lumbar Percutaneous Pedicle Screw Placment Lumbar four-five (N/A Back)  Patient Location: PACU  Anesthesia Type:General  Level of Consciousness: sedated  Airway & Oxygen Therapy: Patient Spontanous Breathing and Patient connected to nasal cannula oxygen  Post-op Assessment: Report given to RN and Post -op Vital signs reviewed and stable  Post vital signs: Reviewed and stable  Last Vitals:  Vitals Value Taken Time  BP 123/67 02/20/2018  3:31 PM  Temp    Pulse 86 02/20/2018  3:37 PM  Resp 20 02/20/2018  3:37 PM  SpO2 99 % 02/20/2018  3:37 PM  Vitals shown include unvalidated device data.  Last Pain:  Vitals:   02/20/18 0858  TempSrc:   PainSc: 0-No pain      Patients Stated Pain Goal: 3 (79/43/27 [id number])  Complications: No apparent anesthesia complications

## 2018-02-20 NOTE — Anesthesia Postprocedure Evaluation (Signed)
Anesthesia Post Note  Patient: Nicholas Wise  Procedure(s) Performed: Left Lumbar Four-Five Anterolateral lumbar interbody fusion (Left Flank) Lumbar Percutaneous Pedicle Screw Placment Lumbar four-five (N/A Back)     Patient location during evaluation: PACU Anesthesia Type: General Level of consciousness: awake Pain management: pain level controlled Vital Signs Assessment: post-procedure vital signs reviewed and stable Respiratory status: spontaneous breathing Cardiovascular status: stable Anesthetic complications: no    Last Vitals:  Vitals:   02/20/18 0836 02/20/18 1531  BP: (!) 142/83 123/67  Pulse: 67 80  Resp: 18 17  Temp: 37.1 C (!) 36.1 C  SpO2: 98% 97%    Last Pain:  Vitals:   02/20/18 0858  TempSrc:   PainSc: 0-No pain                 Shermon Bozzi

## 2018-02-20 NOTE — Progress Notes (Signed)
Patient ID: Nicholas Wise, male   DOB: Nov 25, 1945, 72 y.o.   MRN: [id number] Awake, following commands. Good strength BLE. Reports mild lumbar pain, no buttock or leg pain, tingling, or numbness. Doing well.

## 2018-02-20 NOTE — Anesthesia Postprocedure Evaluation (Signed)
Anesthesia Post Note  Patient: Nicholas Wise  Procedure(s) Performed: Left Lumbar Four-Five Anterolateral lumbar interbody fusion (Left Flank) Lumbar Percutaneous Pedicle Screw Placment Lumbar four-five (N/A Back)     Patient location during evaluation: PACU Anesthesia Type: General Level of consciousness: awake Pain management: pain level controlled Vital Signs Assessment: post-procedure vital signs reviewed and stable Respiratory status: spontaneous breathing Cardiovascular status: stable Postop Assessment: no headache Anesthetic complications: no    Last Vitals:  Vitals:   02/20/18 0836 02/20/18 1531  BP: (!) 142/83 123/67  Pulse: 67 80  Resp: 18 17  Temp: 37.1 C (!) 36.1 C  SpO2: 98% 97%    Last Pain:  Vitals:   02/20/18 1531  TempSrc:   PainSc: 5                  Mackinze Criado

## 2018-02-21 MED ORDER — METHOCARBAMOL 500 MG PO TABS: 500.0000 mg | ORAL_TABLET | Freq: Four times a day (QID) | ORAL | 2 refills | Status: DC | PRN

## 2018-02-21 MED ORDER — CLOPIDOGREL BISULFATE 75 MG PO TABS: 75.0000 mg | ORAL_TABLET | Freq: Every day | ORAL | Status: DC

## 2018-02-21 MED ORDER — OXYCODONE-ACETAMINOPHEN 7.5-325 MG PO TABS: 1.0000 | ORAL_TABLET | ORAL | 0 refills | Status: DC | PRN

## 2018-02-21 NOTE — Discharge Instructions (Signed)

## 2018-02-21 NOTE — Evaluation (Signed)
Physical Therapy Evaluation Patient Details Name: Nicholas Wise MRN: [id number] DOB: June 30, 1946 Today's Date: 02/21/2018   History of Present Illness  72 yo male s/p spinal surgery.(Left Lumbar Four-Five Anterolateral lumbar interbody fusion )  Clinical Impression  Patient seen for mobility assessment s/p spinal surgery. Mobilizing well. Educated patient on precautions, mobility expectations, safety and car transfers. No further acute PT needs. Will sign off.     Follow Up Recommendations No PT follow up    Equipment Recommendations  None recommended by PT    Recommendations for Other Services       Precautions / Restrictions Precautions Precautions: Back Precaution Booklet Issued: Yes (comment) Precaution Comments: verbally reviewed Required Braces or Orthoses: Spinal Brace Spinal Brace: Lumbar corset      Mobility  Bed Mobility               General bed mobility comments: recevied in hall  Transfers Overall transfer level: Independent                  Ambulation/Gait Ambulation/Gait assistance: Independent Gait Distance (Feet): 410 Feet Assistive device: None Gait Pattern/deviations: WFL(Within Functional Limits) Gait velocity: steady with ambulation      Stairs            Wheelchair Mobility    Modified Rankin (Stroke Patients Only)       Balance Overall balance assessment: No apparent balance deficits (not formally assessed)                                           Pertinent Vitals/Pain Pain Assessment: Faces Faces Pain Scale: Hurts little more Pain Descriptors / Indicators: Operative site guarding Pain Intervention(s): Monitored during session    Home Living Family/patient expects to be discharged to:: Private residence Living Arrangements: Spouse/significant other Available Help at Discharge: Family Type of Home: House Home Access: Stairs to enter Entrance Stairs-Rails: None Entrance Stairs-Number of  Steps: 1 Home Layout: One level Home Equipment: Shower seat - built in;Shower seat;Bedside commode;Adaptive equipment;Walker - 2 wheels;Cane - single point      Prior Function Level of Independence: Independent               Hand Dominance   Dominant Hand: Right    Extremity/Trunk Assessment   Upper Extremity Assessment Upper Extremity Assessment: Overall WFL for tasks assessed    Lower Extremity Assessment Lower Extremity Assessment: Overall WFL for tasks assessed    Cervical / Trunk Assessment Cervical / Trunk Assessment: (s/p spinal surgery)  Communication   Communication: No difficulties  Cognition Arousal/Alertness: Awake/alert Behavior During Therapy: WFL for tasks assessed/performed Overall Cognitive Status: Within Functional Limits for tasks assessed                                        General Comments      Exercises     Assessment/Plan    PT Assessment Patent does not need any further PT services  PT Problem List         PT Treatment Interventions      PT Goals (Current goals can be found in the Care Plan section)  Acute Rehab PT Goals PT Goal Formulation: All assessment and education complete, DC therapy    Frequency     Barriers to discharge  Co-evaluation               AM-PAC PT "6 Clicks" Daily Activity  Outcome Measure Difficulty turning over in bed (including adjusting bedclothes, sheets and blankets)?: A Little Difficulty moving from lying on back to sitting on the side of the bed? : A Little Difficulty sitting down on and standing up from a chair with arms (e.g., wheelchair, bedside commode, etc,.)?: None Help needed moving to and from a bed to chair (including a wheelchair)?: None Help needed walking in hospital room?: None Help needed climbing 3-5 steps with a railing? : A Little 6 Click Score: 21    End of Session Equipment Utilized During Treatment: Back brace Activity Tolerance: Patient  tolerated treatment well Patient left: with family/visitor present Nurse Communication: Mobility status PT Visit Diagnosis: Difficulty in walking, not elsewhere classified (R26.2)    Time: 0100-7121 PT Time Calculation (min) (ACUTE ONLY): 16 min   Charges:   PT Evaluation $PT Eval Low Complexity: [place], PT DPT  Board Certified Neurologic Specialist [place] 02/21/2018, 7:16 AM

## 2018-02-21 NOTE — Discharge Summary (Signed)
Physician Discharge Summary  Patient ID: Nicholas Wise MRN: [id number] DOB/AGE: November 05, 1945 72 y.o.  Admit date: 02/20/2018 Discharge date: 02/21/2018  Admission Diagnoses:  Lumbar scoliosis  Discharge Diagnoses:  Same Active Problems:   Lumbar scoliosis   Discharged Condition: Stable  Hospital Course:  Nicholas Wise is a 72 y.o. male who was admitted for the below procedure. There were no post operative complications. At time of discharge, pain was well controlled, ambulating with Pt/OT, tolerating po, voiding normal. Ready for discharge.  Treatments: Surgery Left Lumbar Four-Five Anterolateral lumbar interbody fusion (Left) Lumbar Percutaneous Pedicle Screw Placment Lumbar four-five (N/A)  Discharge Exam: Blood pressure (!) 147/68, pulse 63, temperature 98.3 F (36.8 C), temperature source Oral, resp. rate 18, height 5\' 10"  (1.778 m), weight 80.4 kg, SpO2 97 %. Awake, alert, oriented Speech fluent, appropriate CN grossly intact 5/5 BUE/BLE Wound c/d/i  Disposition: Discharge disposition: 01-Home or Self Care       Discharge Instructions    Call MD for:  difficulty breathing, headache or visual disturbances   Complete by:  As directed    Call MD for:  persistant dizziness or light-headedness   Complete by:  As directed    Call MD for:  redness, tenderness, or signs of infection (pain, swelling, redness, odor or green/yellow discharge around incision site)   Complete by:  As directed    Call MD for:  severe uncontrolled pain   Complete by:  As directed    Call MD for:  temperature >100.4   Complete by:  As directed    Diet general   Complete by:  As directed    Driving Restrictions   Complete by:  As directed    Do not drive until given clearance.   Increase activity slowly   Complete by:  As directed    Lifting restrictions   Complete by:  As directed    Do not lift anything >10lbs. Avoid bending and twisting in awkward positions. Avoid bending at the  back.   May shower / Bathe   Complete by:  As directed    In 24 hours. Okay to wash wound with warm soapy water. Avoid scrubbing the wound. Pat dry.   Remove dressing in 24 hours   Complete by:  As directed      Allergies as of 02/21/2018   No Known Allergies     Medication List    TAKE these medications   acetaminophen 500 MG tablet Commonly known as:  TYLENOL Take 500 mg by mouth as needed for mild pain or headache.   allopurinol 300 MG tablet Commonly known as:  ZYLOPRIM Take 300 mg by mouth daily.   ALPRAZolam 0.5 MG tablet Commonly known as:  XANAX Take 0.5 mg by mouth daily as needed for anxiety.   atorvastatin 40 MG tablet Commonly known as:  LIPITOR Take 40 mg by mouth daily.   cetirizine 10 MG tablet Commonly known as:  ZYRTEC Take 10 mg by mouth daily.   clopidogrel 75 MG tablet Commonly known as:  PLAVIX Take 1 tablet (75 mg total) by mouth daily. Start taking on:  02/26/2018 What changed:  These instructions start on 02/26/2018. If you are unsure what to do until then, ask your doctor or other care provider.   finasteride 5 MG tablet Commonly known as:  PROSCAR Take 5 mg by mouth daily.   Fish Oil 1000 MG Caps Take 1,000 mg by mouth at bedtime.   methocarbamol 500 MG tablet Commonly  known as:  ROBAXIN Take 1 tablet (500 mg total) by mouth every 6 (six) hours as needed for muscle spasms.   oxyCODONE-acetaminophen 7.5-325 MG tablet Commonly known as:  PERCOCET Take 1 tablet by mouth every 4 (four) hours as needed for severe pain.   potassium citrate 10 MEQ (1080 MG) SR tablet Commonly known as:  UROCIT-K Take 10 mEq by mouth 2 (two) times daily.   sertraline 100 MG tablet Commonly known as:  ZOLOFT Take 100 mg by mouth at bedtime.   SYSTANE OP Place 1 drop into both eyes daily.   tamsulosin 0.4 MG Caps capsule Commonly known as:  FLOMAX Take 0.4 mg by mouth 2 (two) times daily.   triamcinolone 55 MCG/ACT Aero nasal inhaler Commonly known  as:  NASACORT Place 1 spray into the nose daily.      Follow-up Information    Erline Levine, MD Follow up.   Specialty:  Neurosurgery Contact information: [address] [place] [address] [phone]           Signed: Traci Sermon 02/21/2018, 8:07 AM

## 2018-02-21 NOTE — Evaluation (Signed)
Occupational Therapy Evaluation Patient Details Name: Nicholas Wise MRN: [id number] DOB: 04-14-1946 Today's Date: 02/21/2018    History of Present Illness 72 yo male s/p Left Lumbar Four-Five Anterolateral lumbar interbody fusion    Clinical Impression   PTA, pt was living with his wife and was independent. Currently, pt performing ADLs and functional mobility at supervision level. Provided education and handout on back precautions, brace management, UB ADLs, LB ADLs, toilet transfer, and shower transfer; pt demonstrated understanding. Answered all pt questions. Recommend dc home once medically stable per physician. All acute OT needs met and will sign off. Thank you.     Follow Up Recommendations  No OT follow up;Supervision/Assistance - 24 hour    Equipment Recommendations  None recommended by OT    Recommendations for Other Services       Precautions / Restrictions Precautions Precautions: Back Precaution Booklet Issued: Yes (comment) Precaution Comments: verbally reviewed Required Braces or Orthoses: Spinal Brace Spinal Brace: Lumbar corset Restrictions Weight Bearing Restrictions: No      Mobility Bed Mobility               General bed mobility comments: OOB upon arrival  Transfers Overall transfer level: Independent                    Balance Overall balance assessment: No apparent balance deficits (not formally assessed)                                         ADL either performed or assessed with clinical judgement   ADL Overall ADL's : Needs assistance/impaired                                       General ADL Comments: Providing education on back preceautions and compensatory tehcniques for adherance during ADLs. Pt performed ADLs and functional mobility at supervision level. Pt demonstrating good understanding of back precautions.      Vision Baseline Vision/History: Wears glasses Patient Visual  Report: No change from baseline       Perception     Praxis      Pertinent Vitals/Pain Pain Assessment: Faces Faces Pain Scale: Hurts little more Pain Descriptors / Indicators: Operative site guarding Pain Intervention(s): Monitored during session;Limited activity within patient's tolerance;Repositioned     Hand Dominance Right   Extremity/Trunk Assessment Upper Extremity Assessment Upper Extremity Assessment: Overall WFL for tasks assessed   Lower Extremity Assessment Lower Extremity Assessment: Overall WFL for tasks assessed   Cervical / Trunk Assessment Cervical / Trunk Assessment: Other exceptions Cervical / Trunk Exceptions: s/p spinal surgery   Communication Communication Communication: No difficulties   Cognition Arousal/Alertness: Awake/alert Behavior During Therapy: WFL for tasks assessed/performed Overall Cognitive Status: Within Functional Limits for tasks assessed                                     General Comments  Wife present throughout session    Exercises     Shoulder Instructions      Home Living Family/patient expects to be discharged to:: Private residence Living Arrangements: Spouse/significant other Available Help at Discharge: Family Type of Home: House Home Access: Stairs to enter [employer] of Steps: 1 Entrance Stairs-Rails:  None Home Layout: One level     Bathroom Shower/Tub: Occupational psychologist: Handicapped height     Home Equipment: Shower seat - built in;Shower seat;Bedside commode;Adaptive equipment;Walker - 2 wheels;Cane - single point Adaptive Equipment: Reacher;Long-handled shoe horn;Sock aid        Prior Functioning/Environment Level of Independence: Independent        Comments: ADLs, IADLs, driving, and working        OT Problem List: Decreased activity tolerance;Decreased range of motion;Pain;Decreased knowledge of use of DME or AE;Decreased knowledge of precautions       OT Treatment/Interventions:      OT Goals(Current goals can be found in the care plan section) Acute Rehab OT Goals Patient Stated Goal: Go home today OT Goal Formulation: All assessment and education complete, DC therapy  OT Frequency:     Barriers to D/C:            Co-evaluation              AM-PAC PT "6 Clicks" Daily Activity     Outcome Measure Help from another person eating meals?: None Help from another person taking care of personal grooming?: None Help from another person toileting, which includes using toliet, bedpan, or urinal?: None Help from another person bathing (including washing, rinsing, drying)?: None Help from another person to put on and taking off regular upper body clothing?: None Help from another person to put on and taking off regular lower body clothing?: None 6 Click Score: 24   End of Session Nurse Communication: Mobility status;Precautions  Activity Tolerance: Patient tolerated treatment well Patient left: with nursing/sitter in room;with family/visitor present(OOB and walking with family)  OT Visit Diagnosis: Unsteadiness on feet (R26.81);Pain Pain - part of body: (Back)                Time: 9449-6759 OT Time Calculation (min): 18 min Charges:  OT General Charges $OT Visit: 1 Visit OT Evaluation $OT Eval Low Complexity: [place], OTR/L Acute Rehab Pager: [phone] Office: [place] 02/21/2018, 8:42 AM

## 2018-02-21 NOTE — Progress Notes (Signed)
Patient is discharged from room 3C07 at this time. Alert and in stable condition. IV site d/c'd and instructions read to patient and spouse with understanding verbalized. Ambulate out of unit with spouse and all belongings at side.

## 2018-02-23 ENCOUNTER — Encounter (HOSPITAL_COMMUNITY): Payer: Self-pay | Admitting: Neurosurgery

## 2018-12-21 ENCOUNTER — Telehealth: Payer: Self-pay

## 2018-12-21 NOTE — Telephone Encounter (Signed)

## 2018-12-29 ENCOUNTER — Encounter: Payer: Self-pay | Admitting: Interventional Cardiology

## 2018-12-29 ENCOUNTER — Other Ambulatory Visit: Payer: Self-pay

## 2018-12-29 ENCOUNTER — Telehealth (INDEPENDENT_AMBULATORY_CARE_PROVIDER_SITE_OTHER): Payer: Medicare Other | Admitting: Interventional Cardiology

## 2018-12-29 DIAGNOSIS — I350 Nonrheumatic aortic (valve) stenosis: Secondary | ICD-10-CM | POA: Diagnosis not present

## 2018-12-29 DIAGNOSIS — E782 Mixed hyperlipidemia: Secondary | ICD-10-CM | POA: Diagnosis not present

## 2018-12-29 DIAGNOSIS — Z8673 Personal history of transient ischemic attack (TIA), and cerebral infarction without residual deficits: Secondary | ICD-10-CM

## 2018-12-29 NOTE — Patient Instructions (Signed)

## 2018-12-29 NOTE — Progress Notes (Signed)
Virtual Visit via Video Note   This visit type was conducted due to national recommendations for restrictions regarding the COVID-19 Pandemic (e.g. social distancing) in an effort to limit this patient's exposure and mitigate transmission in our community.  Due to his co-morbid illnesses, this patient is at least at moderate risk for complications without adequate follow up.  This format is felt to be most appropriate for this patient at this time.  All issues noted in this document were discussed and addressed.  A limited physical exam was performed with this format.  Please refer to the patient's chart for his consent to telehealth for [facility].   Date:  12/29/2018   ID:  Okey Regal Slayden, DOB 10-Jan-1946, MRN [id number]  Patient Location: Home Provider Location: Home  PCP:  Josem Kaufmann, MD  Cardiologist:  No primary care provider on file. [place] Electrophysiologist:  None   Evaluation Performed:  Follow-Up Visit  Chief Complaint:  Aortic stenosis  History of Present Illness:    Nicholas Wise is a 73 y.o. male who I saw for a murmur in 2019.  TIA in 2015. On Plavix since then.   Echo in 2019 showed: Left ventricle: The cavity size was normal. Wall thickness was   normal. Systolic function was normal. The estimated ejection   fraction was in the range of 60% to 65%. Wall motion was normal;   there were no regional wall motion abnormalities. Doppler   parameters are consistent with abnormal left ventricular   relaxation (grade 1 diastolic dysfunction). - Aortic valve: Trileaflet; severely thickened, severely calcified   leaflets. Valve mobility was restricted. There was moderate   stenosis. Peak velocity (S): 312 cm/s. Mean gradient (S): 22 mm   Hg. Peak gradient (S): 39 mm Hg. Valve area (Vmax): 1.24 cm^2.  He had his back surgery in 8/19.  This was successful.  He is back to work.  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  He gets some walking with this job.  3.5  miles/day.  Denies : Chest pain. Dizziness. Leg edema. Nitroglycerin use. Orthopnea. Palpitations. Paroxysmal nocturnal dyspnea. Shortness of breath. Syncope.   No bleeding problems.   The patient does not have symptoms concerning for COVID-19 infection (fever, chills, cough, or new shortness of breath).    Past Medical History:  Diagnosis Date  . Anxiety   . Arthritis   . Depression   . Heart murmur   . Hemorrhoids   . Hiatal hernia   . High cholesterol   . History of kidney stones   . Hx of adenomatous polyp of colon 01/31/2017  . Numbness and tingling in left hand   . PONV (postoperative nausea and vomiting)    bladder spasms went home after neck surgery w/ catheter  . Stroke ([place])    Tia   . TIA (transient ischemic attack)   . Urinary frequency    Past Surgical History:  Procedure Laterality Date  . ANTERIOR CERVICAL DECOMP/DISCECTOMY FUSION N/A 04/11/2014   Procedure: ANTERIOR CERVICAL DECOMPRESSION/DISCECTOMY FUSION CERVICAL THREE-FOUR,CERVICAL FOUR-FIVE,CERVICAL FIVE-SIX;  Surgeon: Erline Levine, MD;  Location: [facility];  Service: Neurosurgery;  Laterality: N/A;  . ANTERIOR LAT LUMBAR FUSION Left 02/20/2018   Procedure: Left Lumbar Four-Five Anterolateral lumbar interbody fusion;  Surgeon: Erline Levine, MD;  Location: [facility];  Service: Neurosurgery;  Laterality: Left;  . COLONOSCOPY    . KNEE SURGERY Right   . LUMBAR PERCUTANEOUS PEDICLE SCREW 1 LEVEL N/A 02/20/2018  Procedure: Lumbar Percutaneous Pedicle Screw Placment Lumbar four-five;  Surgeon: Erline Levine, MD;  Location: [facility];  Service: Neurosurgery;  Laterality: N/A;  . TONSILLECTOMY       Current Meds  Medication Sig  . acetaminophen (TYLENOL) 500 MG tablet Take 500 mg by mouth as needed for mild pain or headache.   . allopurinol (ZYLOPRIM) 300 MG tablet Take 300 mg by mouth daily.  Marland Kitchen ALPRAZolam (XANAX) 0.5 MG tablet Take 0.5 mg by mouth daily as needed for anxiety.   Marland Kitchen atorvastatin (LIPITOR) 40 MG tablet  Take 40 mg by mouth daily.  . cetirizine (ZYRTEC) 10 MG tablet Take 10 mg by mouth daily.  . clopidogrel (PLAVIX) 75 MG tablet Take 1 tablet (75 mg total) by mouth daily.  . finasteride (PROSCAR) 5 MG tablet Take 5 mg by mouth daily.  . methocarbamol (ROBAXIN) 500 MG tablet Take 1 tablet (500 mg total) by mouth every 6 (six) hours as needed for muscle spasms.  . Omega-3 Fatty Acids (FISH OIL) 1000 MG CAPS Take 1,000 mg by mouth at bedtime.   Vladimir Faster Glycol-Propyl Glycol (SYSTANE OP) Place 1 drop into both eyes daily.   . potassium citrate (UROCIT-K) 10 MEQ (1080 MG) SR tablet Take 10 mEq by mouth 2 (two) times daily.  . sertraline (ZOLOFT) 100 MG tablet Take 100 mg by mouth at bedtime.  . tamsulosin (FLOMAX) 0.4 MG CAPS capsule Take 0.4 mg by mouth 2 (two) times daily.  Marland Kitchen triamcinolone (NASACORT) 55 MCG/ACT AERO nasal inhaler Place 1 spray into the nose daily.     Allergies:   Patient has no known allergies.   Social History   Tobacco Use  . Smoking status: Never Smoker  . Smokeless tobacco: Never Used  Substance Use Topics  . Alcohol use: Yes    Comment: rarely   . Drug use: No     Family Hx: The patient's family history includes Diabetes in his father; Heart disease in his father. There is no history of Colon cancer.  ROS:   Please see the history of present illness.    Using mask and hand sanitizer when going into offices.; Pain with standing for long period of time.  All other systems reviewed and are negative.   Prior CV studies:   The following studies were reviewed today:  Echo as noted above  Labs/Other Tests and Data Reviewed:    EKG:  An ECG dated 6/19 was personally reviewed today and demonstrated:  NSR, RBBB  Recent Labs: 02/10/2018: BUN 20; Creatinine, Ser 0.62; Hemoglobin 14.3; Platelets 164; Potassium 4.8; Sodium 139   Recent Lipid Panel No results found for: CHOL, TRIG, HDL, CHOLHDL, LDLCALC, LDLDIRECT  Wt Readings from Last 3 Encounters:   12/29/18 172 lb (78 kg)  02/20/18 177 lb 3.2 oz (80.4 kg)  02/10/18 177 lb 3.2 oz (80.4 kg)     Objective:    Vital Signs:  BP 120/74   Ht 5\' 10"  (1.778 m)   Wt 172 lb (78 kg)   BMI 24.68 kg/m    VITAL SIGNS:  reviewed GEN:  no acute distress RESPIRATORY:  normal respiratory effort, symmetric expansion PSYCH:  normal affect exam limited by video format  ASSESSMENT & PLAN:    1. Aortic stenosis: Moderate by 2019 echo.  No sx of severe AS.  Stamina improved since the back surgery.   2. Hyperlipidemia: Followed with PMD.  COntinue atorvastatin.  Healthy deit and exercise discussed.  3. Prior TIA: COntinue clopidogrel. No  bleeding issues.   COVID-19 Education: The signs and symptoms of COVID-19 were discussed with the patient and how to seek care for testing (follow up with PCP or arrange E-visit).  The importance of social distancing was discussed today.  Time:   Today, I have spent 25 minutes with the patient with telehealth technology discussing the above problems.     Medication Adjustments/Labs and Tests Ordered: Current medicines are reviewed at length with the patient today.  Concerns regarding medicines are outlined above.   Tests Ordered: No orders of the defined types were placed in this encounter.   Medication Changes: No orders of the defined types were placed in this encounter.   Follow Up:  Virtual Visit or In Person in 1 year(s)  Signed, Larae Grooms, MD  12/29/2018 11:50 AM    [place]

## 2019-05-10 ENCOUNTER — Other Ambulatory Visit: Payer: Self-pay | Admitting: Orthopaedic Surgery

## 2019-05-10 ENCOUNTER — Telehealth: Payer: Self-pay | Admitting: *Deleted

## 2019-05-10 NOTE — Telephone Encounter (Signed)
Left VM for patient to return call.  Pt takes plavix for TIA in 2015 - this will need to be addressed by his PCP or neurologist.

## 2019-05-10 NOTE — Telephone Encounter (Signed)
   [facility] Pre-operative Risk Assessment    Request for surgical clearance:  1. What type of surgery is being performed? RIGHT KNEE ARTHROPLASTY   2. When is this surgery scheduled? TBD   3. What type of clearance is required (medical clearance vs. Pharmacy clearance to hold med vs. Both)? MEDICAL  4. Are there any medications that need to be held prior to surgery and how long? PLAVIX    5. Practice name and name of physician performing surgery? [facility]; DR. Collier Salina DALLDORF   6. What is your office phone number [phone]   7.   What is your office fax number [phone]  8.   Anesthesia type (None, local, MAC, general) ? SPINAL    Julaine Hua 05/10/2019, 4:29 PM  _________________________________________________________________   (provider comments below)

## 2019-05-11 NOTE — Telephone Encounter (Signed)
   Primary Cardiologist: No primary care provider on file.  Chart reviewed as part of pre-operative protocol coverage. Patient was contacted 05/11/2019 in reference to pre-operative risk assessment for pending surgery as outlined below.  Hosie Nordell Ueda was last seen on 12/29/2018 via virtual visit by Dr. Irish Lack.  Since that day, ISLAM DELLACROCE has done well without chest pain or shortness of breath.  Therefore, based on ACC/AHA guidelines, the patient would be at acceptable risk for the planned procedure without further cardiovascular testing.   I will route this recommendation to the requesting party via Epic fax function and remove from pre-op pool.  Please call with questions. He is on plavix for TIA in 2015, I will defer to his PCP to comment on plavix. Note, patient says his PCP instructed him to come off of plavix for 7 days in 2019 for a procedure.   [place], [place] 05/11/2019, 11:33 AM

## 2019-05-11 NOTE — Telephone Encounter (Signed)
Follow Up:      Returning Angie's call from yesterday.

## 2019-06-02 ENCOUNTER — Other Ambulatory Visit: Payer: Self-pay | Admitting: Orthopaedic Surgery

## 2019-06-02 NOTE — H&P (Signed)
TOTAL KNEE ADMISSION H&P  Patient is being admitted for right total knee arthroplasty.  Subjective:  Chief Complaint:right knee pain.  HPI: Nicholas Wise, 73 y.o. male, has a history of pain and functional disability in the right knee due to arthritis and has failed non-surgical conservative treatments for greater than 12 weeks to includeNSAID's and/or analgesics, corticosteriod injections, viscosupplementation injections, flexibility and strengthening excercises, supervised PT with diminished ADL's post treatment, use of assistive devices, weight reduction as appropriate and activity modification.  Onset of symptoms was gradual, starting 5 years ago with gradually worsening course since that time. The patient noted prior procedures on the knee to include  arthroscopy on the right knee(s).  Patient currently rates pain in the right knee(s) at 10 out of 10 with activity. Patient has night pain, worsening of pain with activity and weight bearing, pain that interferes with activities of daily living, crepitus and joint swelling.  Patient has evidence of subchondral cysts, subchondral sclerosis, periarticular osteophytes and joint space narrowing by imaging studies. There is no active infection.  Patient Active Problem List   Diagnosis Date Noted  . Lumbar scoliosis 02/20/2018  . Heart murmur 01/05/2018  . Hx of adenomatous polyp of colon 01/31/2017  . Antiplatelet or antithrombotic long-term use 01/10/2017  . Cervical myelopathy with cervical radiculopathy 04/11/2014  . Cervical spondylosis with myelopathy 04/11/2014   Past Medical History:  Diagnosis Date  . Anxiety   . Arthritis   . Depression   . Heart murmur   . Hemorrhoids   . Hiatal hernia   . High cholesterol   . History of kidney stones   . Hx of adenomatous polyp of colon 01/31/2017  . Numbness and tingling in left hand   . PONV (postoperative nausea and vomiting)    bladder spasms went home after neck surgery w/ catheter  .  Stroke ([place])    Tia   . TIA (transient ischemic attack)   . Urinary frequency     Past Surgical History:  Procedure Laterality Date  . ANTERIOR CERVICAL DECOMP/DISCECTOMY FUSION N/A 04/11/2014   Procedure: ANTERIOR CERVICAL DECOMPRESSION/DISCECTOMY FUSION CERVICAL THREE-FOUR,CERVICAL FOUR-FIVE,CERVICAL FIVE-SIX;  Surgeon: Erline Levine, MD;  Location: [facility];  Service: Neurosurgery;  Laterality: N/A;  . ANTERIOR LAT LUMBAR FUSION Left 02/20/2018   Procedure: Left Lumbar Four-Five Anterolateral lumbar interbody fusion;  Surgeon: Erline Levine, MD;  Location: [facility];  Service: Neurosurgery;  Laterality: Left;  . COLONOSCOPY    . KNEE SURGERY Right   . LUMBAR PERCUTANEOUS PEDICLE SCREW 1 LEVEL N/A 02/20/2018   Procedure: Lumbar Percutaneous Pedicle Screw Placment Lumbar four-five;  Surgeon: Erline Levine, MD;  Location: [facility];  Service: Neurosurgery;  Laterality: N/A;  . TONSILLECTOMY      No current facility-administered medications for this encounter.    Current Outpatient Medications  Medication Sig Dispense Refill Last Dose  . acetaminophen (TYLENOL) 500 MG tablet Take 500 mg by mouth as needed for mild pain or headache.      . allopurinol (ZYLOPRIM) 300 MG tablet Take 300 mg by mouth daily.     Marland Kitchen ALPRAZolam (XANAX) 0.5 MG tablet Take 0.25-0.5 mg by mouth daily as needed for anxiety.   1   . atorvastatin (LIPITOR) 40 MG tablet Take 40 mg by mouth every evening.      . clopidogrel (PLAVIX) 75 MG tablet Take 1 tablet (75 mg total) by mouth daily.     . finasteride (PROSCAR) 5 MG tablet Take 5 mg by mouth 2 (  two) times daily.      . potassium citrate (UROCIT-K) 10 MEQ (1080 MG) SR tablet Take 10 mEq by mouth 2 (two) times daily.     . sertraline (ZOLOFT) 100 MG tablet Take 100 mg by mouth at bedtime.     . tamsulosin (FLOMAX) 0.4 MG CAPS capsule Take 0.4 mg by mouth 2 (two) times daily.     Marland Kitchen triamcinolone (NASACORT) 55 MCG/ACT AERO nasal inhaler Place 2 sprays into the nose daily.      Vladimir Faster Glycol-Propyl Glycol (SYSTANE OP) Place 1 drop into both eyes daily.       No Known Allergies  Social History   Tobacco Use  . Smoking status: Never Smoker  . Smokeless tobacco: Never Used  Substance Use Topics  . Alcohol use: Yes    Comment: rarely     Family History  Problem Relation Age of Onset  . Diabetes Father   . Heart disease Father   . Colon cancer Neg Hx      Review of Systems  Musculoskeletal: Positive for joint pain.       Right knee  All other systems reviewed and are negative.   Objective:  Physical Exam  Constitutional: He is oriented to person, place, and time. He appears well-developed and well-nourished.  HENT:  [place]: Normocephalic and atraumatic.  Eyes: Pupils are equal, round, and reactive to light.  Neck: Normal range of motion.  Cardiovascular: Normal rate and regular rhythm.  Respiratory: Effort normal.  GI: Soft.  Musculoskeletal:     Comments: Examination of the right knee shows range of motion from just shy of full extension to 120 of flexion.  He has some thigh atrophy compared to the opposite side.  He has tender palpation along the joint line.  Mild crepitation.  No effusion.  His ligaments are stable.  His calf is soft and nontender.  He is neurovascularly intact distally.  Neurological: He is alert and oriented to person, place, and time.  Skin: Skin is warm and dry.  Psychiatric: He has a normal mood and affect. His behavior is normal. Judgment and thought content normal.    Vital signs in last 24 hours: BP: ()/()  Arterial Line BP: ()/()   Labs:   Estimated body mass index is 24.68 kg/m as calculated from the following:   Height as of 12/29/18: 5\' 10"  (1.778 m).   Weight as of 12/29/18: 78 kg.   Imaging Review Plain radiographs demonstrate severe degenerative joint disease of the right knee(s). The overall alignment isneutral. The bone quality appears to be good for age and reported activity  level.      Assessment/Plan:  End stage primary arthritis, right knee   The patient history, physical examination, clinical judgment of the provider and imaging studies are consistent with end stage degenerative joint disease of the right knee(s) and total knee arthroplasty is deemed medically necessary. The treatment options including medical management, injection therapy arthroscopy and arthroplasty were discussed at length. The risks and benefits of total knee arthroplasty were presented and reviewed. The risks due to aseptic loosening, infection, stiffness, patella tracking problems, thromboembolic complications and other imponderables were discussed. The patient acknowledged the explanation, agreed to proceed with the plan and consent was signed. Patient is being admitted for inpatient treatment for surgery, pain control, PT, OT, prophylactic antibiotics, VTE prophylaxis, progressive ambulation and ADL's and discharge planning. The patient is planning to be discharged home with home health services   Patient's anticipated  LOS is less than 2 midnights, meeting these requirements: - Younger than 55 - Lives within 1 hour of care - Has a competent adult at home to recover with post-op recover - NO history of  - Chronic pain requiring opiods  - Diabetes  - Coronary Artery Disease  - Heart failure  - Heart attack  - Stroke  - DVT/VTE  - Cardiac arrhythmia  - Respiratory Failure/COPD  - Renal failure  - Anemia  - Advanced Liver disease

## 2019-06-02 NOTE — Care Plan (Signed)
Spoke with patient prior to surgery. He plans to discharge to home with family and HHPT. Referral to [facility]. He has all needed equipment at home. OPPT has been arranged to start after office follow up.  Choice offered. Patient and MD in agreement with plan.   [place] contact -- [phone] P                                                     [place], [place]

## 2019-06-03 NOTE — Patient Instructions (Addendum)
DUE TO COVID-19 ONLY ONE VISITOR IS ALLOWED TO COME WITH YOU AND STAY IN THE WAITING ROOM ONLY DURING PRE OP AND PROCEDURE DAY OF SURGERY. THE 1 VISITOR MAY VISIT WITH YOU AFTER SURGERY IN YOUR PRIVATE ROOM DURING VISITING HOURS ONLY!  YOU NEED TO HAVE A COVID 19 TEST ON_11/20______ @_12 :30______, THIS TEST MUST BE DONE BEFORE SURGERY, COME  [address], [place] , [address].  ([place]) ONCE YOUR COVID TEST IS COMPLETED, PLEASE BEGIN THE QUARANTINE INSTRUCTIONS AS OUTLINED IN YOUR HANDOUT.                Duff Franchini Shatzer   Your procedure is scheduled on: 06/07/19   Report to [facility] Main  Entrance   Report to Short Stay at 5:30 AM     Call this number if you have problems the morning of surgery [place], NO CHEWING GUM [place].   Do not eat food After Midnight  . YOU MAY HAVE CLEAR LIQUIDS FROM MIDNIGHT UNTIL 4:30AM.   At 4:30AM Please finish the prescribed Pre-Surgery drink.   Nothing by mouth after you finish the  drink !   Take these medicines the morning of surgery with A SIP OF WATER: Zoloft, Finesteride, allopurinol, Flomax,xanax if needed                                 You may not have any metal on your body including              piercings  Do not wear jewelry, , lotions, powders  deodorant         .              Men may shave face and neck.   Do not bring valuables to the hospital. [place].  Contacts, dentures or bridgework may not be worn into surgery.                  [facility] - Preparing for Surgery  Before surgery, you can play an important role.   Because skin is not sterile, your skin needs to be as free of germs as possible.   You can reduce the number of germs on your skin by washing with CHG (chlorahexidine gluconate) soap before surgery .  CHG is an antiseptic cleaner which kills  germs and bonds with the skin to continue killing germs even after washing. Please DO NOT use if you have an allergy to CHG or antibacterial soaps.   If your skin becomes reddened/irritated stop using the CHG and inform your nurse when you arrive at Short Stay.   You may shave your face/neck.  Please follow these instructions carefully:   1.  Shower with CHG Soap the night before surgery and the  morning of Surgery.  2.  If you choose to wash your hair, wash your hair first as usual with your  normal  shampoo.  3.  After you shampoo, rinse your hair and body thoroughly to remove the  shampoo.  4.  Use CHG as you would any other liquid soap.  You can apply chg directly  to the skin and wash                       Gently with a scrungie or clean washcloth.  5.  Apply the CHG Soap to your body ONLY FROM THE NECK DOWN.   Do not use on face/ open                           Wound or open sores. Avoid contact with eyes, ears mouth and genitals (private parts).                       Wash face,  Genitals (private parts) with your normal soap.             6.  Wash thoroughly, paying special attention to the area where your surgery  will be performed.  7.  Thoroughly rinse your body with warm water from the neck down.  8.  DO NOT shower/wash with your normal soap after using and rinsing off  the CHG Soap.             9.  Pat yourself dry with a clean towel.            10.  Wear clean pajamas.            11.  Place clean sheets on your bed the night of your first shower and do not  sleep with pets. Day of Surgery : Do not apply any lotions/deodorants the morning of surgery.  Please wear clean clothes to the hospital/surgery center.  FAILURE TO FOLLOW THESE INSTRUCTIONS MAY RESULT IN THE CANCELLATION OF YOUR SURGERY PATIENT SIGNATURE_________________________________  NURSE  SIGNATURE__________________________________  ________________________________________________________________________   Adam Phenix  An incentive spirometer is a tool that can help keep your lungs clear and active. This tool measures how well you are filling your lungs with each breath. Taking long deep breaths may help reverse or decrease the chance of developing breathing (pulmonary) problems (especially infection) following:  A long period of time when you are unable to move or be active. BEFORE THE PROCEDURE   If the spirometer includes an indicator to show your best effort, your nurse or respiratory therapist will set it to a desired goal.  If possible, sit up straight or lean slightly forward. Try not to slouch.  Hold the incentive spirometer in an upright position. INSTRUCTIONS FOR USE  1. Sit on the edge of your bed if possible, or sit up as far as you can in bed or on a chair. 2. Hold the incentive spirometer in an upright position. 3. Breathe out normally. 4. Place the mouthpiece in your mouth and seal your lips tightly around it. 5. Breathe in slowly and as deeply as possible, raising the piston or the ball toward the top of the column. 6. Hold your breath for 3-5 seconds or for as long as possible. Allow the piston or ball to fall to the bottom of the column. 7. Remove the mouthpiece from your mouth and breathe out normally. 8. Rest for a few seconds and repeat Steps 1 through 7 at least 10 times every 1-2 hours when you are awake. Take your time and take a few normal breaths between deep breaths. 9. The spirometer may include an indicator to show your best effort.  Use the indicator as a goal to work toward during each repetition. 10. After each set of 10 deep breaths, practice coughing to be sure your lungs are clear. If you have an incision (the cut made at the time of surgery), support your incision when coughing by placing a pillow or rolled up towels firmly  against it. Once you are able to get out of bed, walk around indoors and cough well. You may stop using the incentive spirometer when instructed by your caregiver.  RISKS AND COMPLICATIONS  Take your time so you do not get dizzy or light-headed.  If you are in pain, you may need to take or ask for pain medication before doing incentive spirometry. It is harder to take a deep breath if you are having pain. AFTER USE  Rest and breathe slowly and easily.  It can be helpful to keep track of a log of your progress. Your caregiver can provide you with a simple table to help with this. If you are using the spirometer at home, follow these instructions: [place] IF:   You are having difficultly using the spirometer.  You have trouble using the spirometer as often as instructed.  Your pain medication is not giving enough relief while using the spirometer.  You develop fever of 100.5 F (38.1 C) or higher. SEEK IMMEDIATE MEDICAL CARE IF:   You cough up bloody sputum that had not been present before.  You develop fever of 102 F (38.9 C) or greater.  You develop worsening pain at or near the incision site. MAKE SURE YOU:   Understand these instructions.  Will watch your condition.  Will get help right away if you are not doing well or get worse. Document Released: 11/11/2006 Document Revised: 09/23/2011 Document Reviewed: 01/12/2007 [facility] Patient Information 2014 [place], [place].   ________________________________________________________________________

## 2019-06-04 ENCOUNTER — Encounter (HOSPITAL_COMMUNITY)
Admission: RE | Admit: 2019-06-04 | Discharge: 2019-06-04 | Disposition: A | Discharge status: Home / Self Care | Payer: Medicare Other | Source: Ambulatory Visit | Attending: Orthopaedic Surgery | Admitting: Orthopaedic Surgery

## 2019-06-04 ENCOUNTER — Encounter (HOSPITAL_COMMUNITY): Payer: Self-pay

## 2019-06-04 ENCOUNTER — Other Ambulatory Visit: Payer: Self-pay

## 2019-06-04 ENCOUNTER — Other Ambulatory Visit (HOSPITAL_COMMUNITY)
Admission: RE | Admit: 2019-06-04 | Discharge: 2019-06-04 | Disposition: A | Discharge status: Home / Self Care | Payer: Medicare Other | Source: Ambulatory Visit | Attending: Orthopaedic Surgery | Admitting: Orthopaedic Surgery

## 2019-06-04 ENCOUNTER — Ambulatory Visit (HOSPITAL_COMMUNITY)
Admission: RE | Admit: 2019-06-04 | Discharge: 2019-06-04 | Disposition: A | Discharge status: Home / Self Care | Payer: Medicare Other | Source: Ambulatory Visit | Attending: Orthopaedic Surgery | Admitting: Orthopaedic Surgery

## 2019-06-04 DIAGNOSIS — F329 Major depressive disorder, single episode, unspecified: Secondary | ICD-10-CM | POA: Insufficient documentation

## 2019-06-04 DIAGNOSIS — M4322 Fusion of spine, cervical region: Secondary | ICD-10-CM | POA: Diagnosis not present

## 2019-06-04 DIAGNOSIS — R9431 Abnormal electrocardiogram [ECG] [EKG]: Secondary | ICD-10-CM | POA: Diagnosis not present

## 2019-06-04 DIAGNOSIS — I350 Nonrheumatic aortic (valve) stenosis: Secondary | ICD-10-CM | POA: Insufficient documentation

## 2019-06-04 DIAGNOSIS — Z20828 Contact with and (suspected) exposure to other viral communicable diseases: Secondary | ICD-10-CM | POA: Insufficient documentation

## 2019-06-04 DIAGNOSIS — Z01818 Encounter for other preprocedural examination: Secondary | ICD-10-CM | POA: Insufficient documentation

## 2019-06-04 DIAGNOSIS — E7800 Pure hypercholesterolemia, unspecified: Secondary | ICD-10-CM | POA: Insufficient documentation

## 2019-06-04 DIAGNOSIS — F419 Anxiety disorder, unspecified: Secondary | ICD-10-CM | POA: Diagnosis not present

## 2019-06-04 DIAGNOSIS — I4510 Unspecified right bundle-branch block: Secondary | ICD-10-CM | POA: Insufficient documentation

## 2019-06-04 DIAGNOSIS — Z7902 Long term (current) use of antithrombotics/antiplatelets: Secondary | ICD-10-CM | POA: Diagnosis not present

## 2019-06-04 DIAGNOSIS — Z79899 Other long term (current) drug therapy: Secondary | ICD-10-CM | POA: Diagnosis not present

## 2019-06-04 DIAGNOSIS — Z8673 Personal history of transient ischemic attack (TIA), and cerebral infarction without residual deficits: Secondary | ICD-10-CM | POA: Insufficient documentation

## 2019-06-04 DIAGNOSIS — M1711 Unilateral primary osteoarthritis, right knee: Secondary | ICD-10-CM | POA: Insufficient documentation

## 2019-06-04 HISTORY — DX: Nonrheumatic aortic (valve) stenosis: I35.0

## 2019-06-04 LAB — URINALYSIS, ROUTINE W REFLEX MICROSCOPIC
Bilirubin Urine: NEGATIVE
Glucose, UA: NEGATIVE mg/dL
Hgb urine dipstick: NEGATIVE
Ketones, ur: NEGATIVE mg/dL
Leukocytes,Ua: NEGATIVE
Nitrite: NEGATIVE
Protein, ur: NEGATIVE mg/dL
Specific Gravity, Urine: 1.012 (ref 1.005–1.030)
pH: 6 (ref 5.0–8.0)

## 2019-06-04 LAB — BASIC METABOLIC PANEL
Anion gap: 9 (ref 5–15)
BUN: 13 mg/dL (ref 8–23)
CO2: 27 mmol/L (ref 22–32)
Calcium: 9.2 mg/dL (ref 8.9–10.3)
Chloride: 101 mmol/L (ref 98–111)
Creatinine, Ser: 0.61 mg/dL (ref 0.61–1.24)
GFR calc Af Amer: >60 mL/min (ref >60)
GFR calc non Af Amer: >60 mL/min (ref >60)
Glucose, Bld: 96 mg/dL (ref 70–99)
Potassium: 4.4 mmol/L (ref 3.5–5.1)
Sodium: 137 mmol/L (ref 135–145)

## 2019-06-04 LAB — CBC WITH DIFFERENTIAL/PLATELET
Abs Immature Granulocytes: 0.02 10*3/uL (ref 0.00–0.07)
Basophils Absolute: 0 10*3/uL (ref 0.0–0.1)
Basophils Relative: 1 %
Eosinophils Absolute: 0.1 10*3/uL (ref 0.0–0.5)
Eosinophils Relative: 1 %
HCT: 43.2 % (ref 39.0–52.0)
Hemoglobin: 13.8 g/dL (ref 13.0–17.0)
Immature Granulocytes: 0 %
Lymphocytes Relative: 26 %
Lymphs Abs: 1.4 10*3/uL (ref 0.7–4.0)
MCH: 32.8 pg (ref 26.0–34.0)
MCHC: 31.9 g/dL (ref 30.0–36.0)
MCV: 102.6 fL — ABNORMAL HIGH (ref 80.0–100.0)
Monocytes Absolute: 0.6 10*3/uL (ref 0.1–1.0)
Monocytes Relative: 12 %
Neutro Abs: 3.2 10*3/uL (ref 1.7–7.7)
Neutrophils Relative %: 60 %
Platelets: 177 10*3/uL (ref 150–400)
RBC: 4.21 MIL/uL — ABNORMAL LOW (ref 4.22–5.81)
RDW: 13.3 % (ref 11.5–15.5)
WBC: 5.3 10*3/uL (ref 4.0–10.5)
nRBC: 0 % (ref 0.0–0.2)

## 2019-06-04 LAB — SURGICAL PCR SCREEN
MRSA, PCR: NEGATIVE
Staphylococcus aureus: POSITIVE — AB

## 2019-06-04 LAB — APTT: aPTT: 30 seconds (ref 24–36)

## 2019-06-04 LAB — ABO/RH: ABO/RH(D): A POS

## 2019-06-04 LAB — PROTIME-INR
INR: 1.1 (ref 0.8–1.2)
Prothrombin Time: 13.6 seconds (ref 11.4–15.2)

## 2019-06-06 LAB — NOVEL CORONAVIRUS, NAA (HOSP ORDER, SEND-OUT TO REF LAB; TAT 18-24 HRS): SARS-CoV-2, NAA: NOT DETECTED

## 2019-06-07 ENCOUNTER — Encounter (HOSPITAL_COMMUNITY): Payer: Self-pay

## 2019-06-07 MED ORDER — BUPIVACAINE LIPOSOME 1.3 % IJ SUSP
20.0000 mL | Freq: Once | INTRAMUSCULAR | Status: DC
  Filled 2019-06-07: qty 20

## 2019-06-07 MED ORDER — TRANEXAMIC ACID 1000 MG/10ML IV SOLN
2000.0000 mg | INTRAVENOUS | Status: DC
  Filled 2019-06-07: qty 20

## 2019-06-07 NOTE — Progress Notes (Signed)
PCP - Dr. Tawny Asal Cardiologist - Dr. Irish Lack  Chest x-ray - 06/04/19 EKG - 11/20 Stress Test -  ECHO - 01/05/18 Cardiac Cath -   Sleep Study - NA CPAP -   Fasting Blood Sugar -  Checks Blood Sugar _____ times a day  Blood Thinner Instructions:Plavix Aspirin Instructions:Stop 5 days prior to DOS Last Dose:06/03/19  Anesthesia review:   Patient denies shortness of breath, fever, cough and chest pain at PAT appointment yes  Patient verbalized understanding of instructions that were given to them at the PAT appointment. Patient was also instructed that they will need to review over the PAT instructions again at home before surgery. yes

## 2019-06-07 NOTE — Progress Notes (Signed)
Anesthesia Chart Review   Case: [id number] Date/Time: 06/08/19 0715   Procedure: RIGHT TOTAL KNEE ARTHROPLASTY (Right Knee)   Anesthesia type: Spinal   Pre-op diagnosis: RIGHT KNEE DEGENERATIVE JOINT DISEASE   Location: [facility]   Surgeon: Melrose Nakayama, MD      DISCUSSION:73 y.o. never smoker with h/o PONV, TIA, moderate aortic stenosis (Peak velocity (S): 312 cm/s. Mean gradient (S): 22 mm Hg. Peak gradient (S): 39 mm Hg. Valve area (Vmax): 1.24 cm^2.), right DJD scheduled for above procedure 06/08/2019 with Dr. Melrose Nakayama.   Pt cleared by cardiology 05/11/2019.  Per Almyra Deforest, PA-C, "Turon Northcraft Hoehn was last seen on 12/29/2018 via virtual visit by Dr. Irish Lack.  Since that day, GAR BOYLES has done well without chest pain or shortness of breath. Therefore, based on ACC/AHA guidelines, the patient would be at acceptable risk for the planned procedure without further cardiovascular testing. I will route this recommendation to the requesting party via Epic fax function and remove from pre-op pool.  Please call with questions. He is on plavix for TIA in 2015, I will defer to his PCP to comment on plavix. Note, patient says his PCP instructed him to come off of plavix for 7 days in 2019 for a procedure."  Pt had telemedicine visit with cardiologist, Dr. Larae Grooms, 12/29/2018.  Per note, "Aortic stenosis: Moderate by 2019 echo.  No sx of severe AS.  Stamina improved since the back surgery." 1 year follow up recommended.   Discussed h/o moderate AS with last echo greater than 1 year ago with Dr. Nyoka Cowden who advised me to discuss with cardiology.  Per cardiology repeat echo not needed at this time, no sx suggestive of worsening valve issues.   VS: BP (!) 145/57 (BP Location: Left Arm)   Pulse 69   Temp 36.8 C (Oral)   Resp 16   Ht 5\' 9"  (1.753 m)   Wt 79.1 kg   SpO2 95%   BMI 25.74 kg/m   PROVIDERS: Josem Kaufmann, MD is PCP   Larae Grooms, MD is Cardiologist   LABS: Labs reviewed: Acceptable for surgery. (all labs ordered are listed, but only abnormal results are displayed)  Labs Reviewed  SURGICAL PCR SCREEN - Abnormal; Notable for the following components:      Result Value   Staphylococcus aureus POSITIVE (*)    All other components within normal limits  CBC WITH DIFFERENTIAL/PLATELET - Abnormal; Notable for the following components:   RBC 4.21 (*)    MCV 102.6 (*)    All other components within normal limits  URINALYSIS, ROUTINE W REFLEX MICROSCOPIC - Abnormal; Notable for the following components:   Color, Urine STRAW (*)    All other components within normal limits  APTT  BASIC METABOLIC PANEL  PROTIME-INR  TYPE AND SCREEN  ABO/RH     IMAGES: Chest Xray 06/04/2019 FINDINGS: The heart size and mediastinal contours are within normal limits. Both lungs are clear. Disc degenerative disease of the thoracic spine.  IMPRESSION: No acute abnormality of the lungs.  EKG: 06/04/2019 Rate 64 bpm Normal sinus rhythm Right bundle branch block  Abnormal ECG   CV: Echo 01/05/2018 Study Conclusions  - Left ventricle: The cavity size was normal. Wall thickness was   normal. Systolic function was normal. The estimated ejection   fraction was in the range of 60% to 65%. Wall motion was normal;   there were no regional wall motion abnormalities. Doppler   parameters  are consistent with abnormal left ventricular   relaxation (grade 1 diastolic dysfunction). - Aortic valve: Trileaflet; severely thickened, severely calcified   leaflets. Valve mobility was restricted. There was moderate   stenosis. Peak velocity (S): 312 cm/s. Mean gradient (S): 22 mm   Hg. Peak gradient (S): 39 mm Hg. Valve area (Vmax): 1.24 cm^2. Past Medical History:  Diagnosis Date  . Anxiety   . Arthritis   . Depression   . Hemorrhoids   . Hiatal hernia   . High cholesterol   . History of kidney stones   . Hx of adenomatous polyp of colon 01/31/2017  .  Numbness and tingling in left hand   . PONV (postoperative nausea and vomiting)    bladder spasms went home after neck surgery w/ catheter  . Stroke ([place]) 11/09/2013   Tia   . TIA (transient ischemic attack)   . Urinary frequency     Past Surgical History:  Procedure Laterality Date  . ANTERIOR CERVICAL DECOMP/DISCECTOMY FUSION N/A 04/11/2014   Procedure: ANTERIOR CERVICAL DECOMPRESSION/DISCECTOMY FUSION CERVICAL THREE-FOUR,CERVICAL FOUR-FIVE,CERVICAL FIVE-SIX;  Surgeon: Erline Levine, MD;  Location: [facility];  Service: Neurosurgery;  Laterality: N/A;  . ANTERIOR LAT LUMBAR FUSION Left 02/20/2018   Procedure: Left Lumbar Four-Five Anterolateral lumbar interbody fusion;  Surgeon: Erline Levine, MD;  Location: [facility];  Service: Neurosurgery;  Laterality: Left;  . BACK SURGERY    . COLONOSCOPY    . KNEE SURGERY Right   . LUMBAR PERCUTANEOUS PEDICLE SCREW 1 LEVEL N/A 02/20/2018   Procedure: Lumbar Percutaneous Pedicle Screw Placment Lumbar four-five;  Surgeon: Erline Levine, MD;  Location: [facility];  Service: Neurosurgery;  Laterality: N/A;  . TONSILLECTOMY      MEDICATIONS: . acetaminophen (TYLENOL) 500 MG tablet  . allopurinol (ZYLOPRIM) 300 MG tablet  . ALPRAZolam (XANAX) 0.5 MG tablet  . atorvastatin (LIPITOR) 40 MG tablet  . clopidogrel (PLAVIX) 75 MG tablet  . finasteride (PROSCAR) 5 MG tablet  . Polyethyl Glycol-Propyl Glycol (SYSTANE OP)  . potassium citrate (UROCIT-K) 10 MEQ (1080 MG) SR tablet  . sertraline (ZOLOFT) 100 MG tablet  . tamsulosin (FLOMAX) 0.4 MG CAPS capsule  . triamcinolone (NASACORT) 55 MCG/ACT AERO nasal inhaler   No current facility-administered medications for this encounter.    Derrill Memo ON 06/08/2019] bupivacaine liposome (EXPAREL) 1.3 % injection 266 mg  . [START ON 06/08/2019] tranexamic acid (CYKLOKAPRON) 2,000 mg in sodium chloride 0.9 % 50 mL Topical Application     Maia Plan [facility] Pre-Surgical Testing [phone] 06/07/19  11:23 AM

## 2019-06-07 NOTE — Anesthesia Preprocedure Evaluation (Addendum)
Anesthesia Evaluation  Patient identified by MRN, date of birth, ID band Patient awake    Reviewed: Allergy & Precautions, NPO status , Patient's Chart, lab work & pertinent test results  History of Anesthesia Complications (+) PONV and history of anesthetic complications  Airway Mallampati: II  TM Distance: >3 FB Neck ROM: Full    Dental  (+) Dental Advisory Given, Teeth Intact   Pulmonary neg pulmonary ROS,    Pulmonary exam normal breath sounds clear to auscultation       Cardiovascular + Valvular Problems/Murmurs AS  Rhythm:Regular Rate:Normal + Systolic murmurs Echo [id number] - Left ventricle: The cavity size was normal. Wall thickness was normal. Systolic function was normal. The estimated ejection  fraction was in the range of 60% to 65%. Wall motion was normal; there were no regional wall motion abnormalities. Doppler parameters are consistent with abnormal left ventricular relaxation (grade 1 diastolic dysfunction). - Aortic valve: Trileaflet; severely thickened, severely calcified  leaflets. Valve mobility was restricted. There was moderate stenosis. Peak velocity (S): 312 cm/s. Mean gradient (S): 22 mm  Hg. Peak gradient (S): 39 mm Hg. Valve area (Vmax): 1.24 cm^2.   Neuro/Psych PSYCHIATRIC DISORDERS Anxiety Depression TIACVA    GI/Hepatic Neg liver ROS, hiatal hernia,   Endo/Other  negative endocrine ROS  Renal/GU negative Renal ROS     Musculoskeletal  (+) Arthritis ,   Abdominal   Peds  Hematology negative hematology ROS (+)   Anesthesia Other Findings   Reproductive/Obstetrics                                                             Anesthesia Evaluation  Patient identified by MRN, date of birth, ID band Patient awake    Reviewed: Allergy & Precautions, NPO status , Patient's Chart, lab work & pertinent test results  History of Anesthesia Complications (+) PONV and  MALIGNANT HYPERTHERMIA  Airway Mallampati: II  TM Distance: >3 FB     Dental   Pulmonary neg pulmonary ROS,    breath sounds clear to auscultation       Cardiovascular negative cardio ROS  + Valvular Problems/Murmurs  Rhythm:Regular Rate:Normal     Neuro/Psych    GI/Hepatic Neg liver ROS, hiatal hernia,   Endo/Other  negative endocrine ROS  Renal/GU negative Renal ROS     Musculoskeletal  (+) Arthritis ,   Abdominal   Peds  Hematology   Anesthesia Other Findings   Reproductive/Obstetrics                            Anesthesia Physical Anesthesia Plan  ASA: III  Anesthesia Plan: General   Post-op Pain Management:    Induction: Intravenous  PONV Risk Score and Plan: 3 and Ondansetron, Dexamethasone and Midazolam  Airway Management Planned: Oral ETT  Additional Equipment:   Intra-op Plan:   Post-operative Plan: Possible Post-op intubation/ventilation  Informed Consent: I have reviewed the patients History and Physical, chart, labs and discussed the procedure including the risks, benefits and alternatives for the proposed anesthesia with the patient or authorized representative who has indicated his/her understanding and acceptance.   Dental advisory given  Plan Discussed with: Anesthesiologist and CRNA  Anesthesia Plan Comments:         Anesthesia Quick  Evaluation  Anesthesia Physical Anesthesia Plan  ASA: IV  Anesthesia Plan: General   Post-op Pain Management: GA combined w/ Regional for post-op pain   Induction: Intravenous  PONV Risk Score and Plan: 4 or greater and Propofol infusion, Treatment may vary due to age or medical condition and Ondansetron  Airway Management Planned: LMA  Additional Equipment: None  Intra-op Plan:   Post-operative Plan: Extubation in OR  Informed Consent: I have reviewed the patients History and Physical, chart, labs and discussed the procedure including the  risks, benefits and alternatives for the proposed anesthesia with the patient or authorized representative who has indicated his/her understanding and acceptance.     Dental advisory given  Plan Discussed with: CRNA  Anesthesia Plan Comments: (See PAT note 06/04/2019, Konrad Felix, PA-C)      Anesthesia Quick Evaluation

## 2019-06-08 ENCOUNTER — Observation Stay (HOSPITAL_COMMUNITY)
Admission: RE | Admit: 2019-06-08 | Discharge: 2019-06-09 | Disposition: A | Discharge status: Home Health | Payer: Medicare Other | Attending: Orthopaedic Surgery | Admitting: Orthopaedic Surgery

## 2019-06-08 ENCOUNTER — Encounter (HOSPITAL_COMMUNITY): Payer: Self-pay

## 2019-06-08 ENCOUNTER — Other Ambulatory Visit: Payer: Self-pay

## 2019-06-08 ENCOUNTER — Ambulatory Visit (HOSPITAL_COMMUNITY): Payer: Medicare Other | Admitting: Anesthesiology

## 2019-06-08 ENCOUNTER — Encounter (HOSPITAL_COMMUNITY)
Admission: RE | Disposition: A | Discharge status: Home Health | Payer: Self-pay | Source: Home / Self Care | Attending: Orthopaedic Surgery

## 2019-06-08 ENCOUNTER — Ambulatory Visit (HOSPITAL_COMMUNITY): Payer: Medicare Other | Admitting: Physician Assistant

## 2019-06-08 DIAGNOSIS — Z79899 Other long term (current) drug therapy: Secondary | ICD-10-CM | POA: Insufficient documentation

## 2019-06-08 DIAGNOSIS — Z8673 Personal history of transient ischemic attack (TIA), and cerebral infarction without residual deficits: Secondary | ICD-10-CM | POA: Insufficient documentation

## 2019-06-08 DIAGNOSIS — F329 Major depressive disorder, single episode, unspecified: Secondary | ICD-10-CM | POA: Insufficient documentation

## 2019-06-08 DIAGNOSIS — I350 Nonrheumatic aortic (valve) stenosis: Secondary | ICD-10-CM | POA: Insufficient documentation

## 2019-06-08 DIAGNOSIS — F419 Anxiety disorder, unspecified: Secondary | ICD-10-CM | POA: Diagnosis not present

## 2019-06-08 DIAGNOSIS — M1711 Unilateral primary osteoarthritis, right knee: Secondary | ICD-10-CM | POA: Diagnosis present

## 2019-06-08 DIAGNOSIS — M6281 Muscle weakness (generalized): Secondary | ICD-10-CM | POA: Insufficient documentation

## 2019-06-08 DIAGNOSIS — Z7902 Long term (current) use of antithrombotics/antiplatelets: Secondary | ICD-10-CM | POA: Insufficient documentation

## 2019-06-08 DIAGNOSIS — E7800 Pure hypercholesterolemia, unspecified: Secondary | ICD-10-CM | POA: Diagnosis not present

## 2019-06-08 HISTORY — PX: TOTAL KNEE ARTHROPLASTY: SHX125

## 2019-06-08 LAB — TYPE AND SCREEN
ABO/RH(D): A POS
Antibody Screen: NEGATIVE

## 2019-06-08 SURGERY — ARTHROPLASTY, KNEE, TOTAL
Anesthesia: General | Site: Knee | Laterality: Right

## 2019-06-08 MED ORDER — DIPHENHYDRAMINE HCL 12.5 MG/5ML PO ELIX: 12.5000 mg | ORAL_SOLUTION | ORAL | Status: DC | PRN

## 2019-06-08 MED ORDER — POVIDONE-IODINE 10 % EX SWAB: 2.0000 "application " | Freq: Once | CUTANEOUS | Status: DC

## 2019-06-08 MED ORDER — METHOCARBAMOL 500 MG PO TABS: 500.0000 mg | ORAL_TABLET | Freq: Four times a day (QID) | ORAL | Status: DC | PRN

## 2019-06-08 MED ORDER — METOCLOPRAMIDE HCL 5 MG/ML IJ SOLN: 5.0000 mg | Freq: Three times a day (TID) | INTRAMUSCULAR | Status: DC | PRN

## 2019-06-08 MED ORDER — METOCLOPRAMIDE HCL 5 MG PO TABS: 5.0000 mg | ORAL_TABLET | Freq: Three times a day (TID) | ORAL | Status: DC | PRN

## 2019-06-08 MED ORDER — CHLORHEXIDINE GLUCONATE 4 % EX LIQD: 60.0000 mL | Freq: Once | CUTANEOUS | Status: DC

## 2019-06-08 MED ORDER — HYDROCODONE-ACETAMINOPHEN 7.5-325 MG PO TABS: 1.0000 | ORAL_TABLET | ORAL | Status: DC | PRN

## 2019-06-08 MED ORDER — PROPOFOL 500 MG/50ML IV EMUL
INTRAVENOUS | Status: AC
  Filled 2019-06-08: qty 50

## 2019-06-08 MED ORDER — PROPOFOL 10 MG/ML IV BOLUS
INTRAVENOUS | Status: AC
  Filled 2019-06-08: qty 20

## 2019-06-08 MED ORDER — SODIUM CHLORIDE 0.9 % IR SOLN
Status: DC | PRN
  Administered 2019-06-08: 1000 mL

## 2019-06-08 MED ORDER — TRANEXAMIC ACID-NACL 1000-0.7 MG/100ML-% IV SOLN
1000.0000 mg | Freq: Once | INTRAVENOUS | Status: AC
  Administered 2019-06-08: 1000 mg via INTRAVENOUS
  Filled 2019-06-08: qty 100

## 2019-06-08 MED ORDER — HYDROMORPHONE HCL 1 MG/ML IJ SOLN: 0.2500 mg | INTRAMUSCULAR | Status: DC | PRN

## 2019-06-08 MED ORDER — BUPIVACAINE-EPINEPHRINE 0.25% -1:200000 IJ SOLN
INTRAMUSCULAR | Status: DC | PRN
  Administered 2019-06-08: 30 mL

## 2019-06-08 MED ORDER — HYDROCODONE-ACETAMINOPHEN 5-325 MG PO TABS: 1.0000 | ORAL_TABLET | ORAL | Status: DC | PRN

## 2019-06-08 MED ORDER — CEFAZOLIN SODIUM-DEXTROSE 2-4 GM/100ML-% IV SOLN
2.0000 g | Freq: Four times a day (QID) | INTRAVENOUS | Status: AC
  Administered 2019-06-08 (×2): 2 g via INTRAVENOUS
  Filled 2019-06-08 (×2): qty 100

## 2019-06-08 MED ORDER — DEXAMETHASONE SODIUM PHOSPHATE 4 MG/ML IJ SOLN
INTRAMUSCULAR | Status: DC | PRN
  Administered 2019-06-08: 10 mg via INTRAVENOUS

## 2019-06-08 MED ORDER — POLYETHYL GLYCOL-PROPYL GLYCOL 0.4-0.3 % OP GEL: Freq: Every day | OPHTHALMIC | Status: DC

## 2019-06-08 MED ORDER — CLONIDINE HCL (ANALGESIA) 100 MCG/ML EP SOLN
EPIDURAL | Status: DC | PRN
  Administered 2019-06-08: 50 ug

## 2019-06-08 MED ORDER — LACTATED RINGERS IV SOLN
INTRAVENOUS | Status: DC
  Administered 2019-06-08 (×2): via INTRAVENOUS

## 2019-06-08 MED ORDER — EPHEDRINE 5 MG/ML INJ
INTRAVENOUS | Status: AC
  Filled 2019-06-08: qty 10

## 2019-06-08 MED ORDER — TRANEXAMIC ACID-NACL 1000-0.7 MG/100ML-% IV SOLN
1000.0000 mg | INTRAVENOUS | Status: AC
  Administered 2019-06-08: 1000 mg via INTRAVENOUS

## 2019-06-08 MED ORDER — ONDANSETRON HCL 4 MG PO TABS: 4.0000 mg | ORAL_TABLET | Freq: Four times a day (QID) | ORAL | Status: DC | PRN

## 2019-06-08 MED ORDER — SERTRALINE HCL 50 MG PO TABS: 100.0000 mg | ORAL_TABLET | Freq: Every day | ORAL | Status: DC

## 2019-06-08 MED ORDER — FENTANYL CITRATE (PF) 100 MCG/2ML IJ SOLN
INTRAMUSCULAR | Status: DC | PRN
  Administered 2019-06-08 (×4): 50 ug via INTRAVENOUS

## 2019-06-08 MED ORDER — STERILE WATER FOR IRRIGATION IR SOLN
Status: DC | PRN
  Administered 2019-06-08: 2000 mL

## 2019-06-08 MED ORDER — CEFAZOLIN SODIUM-DEXTROSE 2-4 GM/100ML-% IV SOLN
INTRAVENOUS | Status: AC
  Filled 2019-06-08: qty 100

## 2019-06-08 MED ORDER — LIDOCAINE 2% (20 MG/ML) 5 ML SYRINGE
INTRAMUSCULAR | Status: DC | PRN
  Administered 2019-06-08: 100 mg via INTRAVENOUS

## 2019-06-08 MED ORDER — ALUM & MAG HYDROXIDE-SIMETH 200-200-20 MG/5ML PO SUSP: 30.0000 mL | ORAL | Status: DC | PRN

## 2019-06-08 MED ORDER — CLOPIDOGREL BISULFATE 75 MG PO TABS
75.0000 mg | ORAL_TABLET | Freq: Every day | ORAL | Status: DC
  Administered 2019-06-09: 75 mg via ORAL
  Filled 2019-06-08: qty 1

## 2019-06-08 MED ORDER — MENTHOL 3 MG MT LOZG: 1.0000 | LOZENGE | OROMUCOSAL | Status: DC | PRN

## 2019-06-08 MED ORDER — ONDANSETRON HCL 4 MG/2ML IJ SOLN: 4.0000 mg | Freq: Once | INTRAMUSCULAR | Status: DC | PRN

## 2019-06-08 MED ORDER — POTASSIUM CITRATE ER 10 MEQ (1080 MG) PO TBCR
10.0000 meq | EXTENDED_RELEASE_TABLET | Freq: Two times a day (BID) | ORAL | Status: DC
  Administered 2019-06-08 (×2): 10 meq via ORAL
  Filled 2019-06-08 (×4): qty 1

## 2019-06-08 MED ORDER — ALPRAZOLAM 0.25 MG PO TABS: 0.2500 mg | ORAL_TABLET | Freq: Every day | ORAL | Status: DC | PRN

## 2019-06-08 MED ORDER — FENTANYL CITRATE (PF) 250 MCG/5ML IJ SOLN
INTRAMUSCULAR | Status: AC
  Filled 2019-06-08: qty 5

## 2019-06-08 MED ORDER — LACTATED RINGERS IV SOLN: INTRAVENOUS | Status: DC

## 2019-06-08 MED ORDER — CEFAZOLIN SODIUM-DEXTROSE 2-4 GM/100ML-% IV SOLN
2.0000 g | INTRAVENOUS | Status: AC
  Administered 2019-06-08: 2 g via INTRAVENOUS

## 2019-06-08 MED ORDER — BUPIVACAINE-EPINEPHRINE 0.25% -1:200000 IJ SOLN
INTRAMUSCULAR | Status: AC
  Filled 2019-06-08: qty 1

## 2019-06-08 MED ORDER — ASPIRIN 81 MG PO CHEW
81.0000 mg | CHEWABLE_TABLET | Freq: Two times a day (BID) | ORAL | Status: DC
  Administered 2019-06-09: 81 mg via ORAL
  Filled 2019-06-08: qty 1

## 2019-06-08 MED ORDER — DOCUSATE SODIUM 100 MG PO CAPS
100.0000 mg | ORAL_CAPSULE | Freq: Two times a day (BID) | ORAL | Status: DC
  Administered 2019-06-08: 100 mg via ORAL
  Filled 2019-06-08 (×2): qty 1

## 2019-06-08 MED ORDER — ARTIFICIAL TEARS OPHTHALMIC OINT
TOPICAL_OINTMENT | Freq: Every day | OPHTHALMIC | Status: DC
  Administered 2019-06-08: 14:00:00 via OPHTHALMIC
  Filled 2019-06-08: qty 3.5

## 2019-06-08 MED ORDER — ONDANSETRON HCL 4 MG/2ML IJ SOLN
INTRAMUSCULAR | Status: DC | PRN
  Administered 2019-06-08: 4 mg via INTRAVENOUS

## 2019-06-08 MED ORDER — EPHEDRINE SULFATE-NACL 50-0.9 MG/10ML-% IV SOSY
PREFILLED_SYRINGE | INTRAVENOUS | Status: DC | PRN
  Administered 2019-06-08: 10 mg via INTRAVENOUS

## 2019-06-08 MED ORDER — LIDOCAINE 2% (20 MG/ML) 5 ML SYRINGE
INTRAMUSCULAR | Status: AC
  Filled 2019-06-08: qty 5

## 2019-06-08 MED ORDER — MIDAZOLAM HCL 5 MG/5ML IJ SOLN
INTRAMUSCULAR | Status: DC | PRN
  Administered 2019-06-08: 2 mg via INTRAVENOUS

## 2019-06-08 MED ORDER — ROPIVACAINE HCL 7.5 MG/ML IJ SOLN
INTRAMUSCULAR | Status: DC | PRN
  Administered 2019-06-08: 20 mL via PERINEURAL

## 2019-06-08 MED ORDER — FINASTERIDE 5 MG PO TABS
5.0000 mg | ORAL_TABLET | Freq: Two times a day (BID) | ORAL | Status: DC
  Administered 2019-06-08: 5 mg via ORAL
  Filled 2019-06-08 (×2): qty 1

## 2019-06-08 MED ORDER — BISACODYL 5 MG PO TBEC: 5.0000 mg | DELAYED_RELEASE_TABLET | Freq: Every day | ORAL | Status: DC | PRN

## 2019-06-08 MED ORDER — PROPOFOL 10 MG/ML IV BOLUS
INTRAVENOUS | Status: DC | PRN
  Administered 2019-06-08: 120 mg via INTRAVENOUS

## 2019-06-08 MED ORDER — SODIUM CHLORIDE (PF) 0.9 % IJ SOLN
INTRAMUSCULAR | Status: DC | PRN
  Administered 2019-06-08: 30 mL

## 2019-06-08 MED ORDER — TRANEXAMIC ACID 1000 MG/10ML IV SOLN
INTRAVENOUS | Status: DC | PRN
  Administered 2019-06-08: 2000 mg via TOPICAL

## 2019-06-08 MED ORDER — ALLOPURINOL 300 MG PO TABS
300.0000 mg | ORAL_TABLET | Freq: Every day | ORAL | Status: DC
  Filled 2019-06-08: qty 1

## 2019-06-08 MED ORDER — MEPERIDINE HCL 50 MG/ML IJ SOLN: 6.2500 mg | INTRAMUSCULAR | Status: DC | PRN

## 2019-06-08 MED ORDER — METHOCARBAMOL 500 MG IVPB - SIMPLE MED
500.0000 mg | Freq: Four times a day (QID) | INTRAVENOUS | Status: DC | PRN
  Filled 2019-06-08: qty 50

## 2019-06-08 MED ORDER — DEXAMETHASONE SODIUM PHOSPHATE 10 MG/ML IJ SOLN
INTRAMUSCULAR | Status: AC
  Filled 2019-06-08: qty 1

## 2019-06-08 MED ORDER — ACETAMINOPHEN 325 MG PO TABS: 325.0000 mg | ORAL_TABLET | Freq: Four times a day (QID) | ORAL | Status: DC | PRN

## 2019-06-08 MED ORDER — 0.9 % SODIUM CHLORIDE (POUR BTL) OPTIME
TOPICAL | Status: DC | PRN
  Administered 2019-06-08: 1000 mL

## 2019-06-08 MED ORDER — MIDAZOLAM HCL 2 MG/2ML IJ SOLN
INTRAMUSCULAR | Status: AC
  Filled 2019-06-08: qty 2

## 2019-06-08 MED ORDER — TAMSULOSIN HCL 0.4 MG PO CAPS
0.4000 mg | ORAL_CAPSULE | Freq: Two times a day (BID) | ORAL | Status: DC
  Administered 2019-06-08 – 2019-06-09 (×2): 0.4 mg via ORAL
  Filled 2019-06-08 (×2): qty 1

## 2019-06-08 MED ORDER — KETOROLAC TROMETHAMINE 15 MG/ML IJ SOLN
7.5000 mg | Freq: Four times a day (QID) | INTRAMUSCULAR | Status: AC
  Administered 2019-06-08 – 2019-06-09 (×4): 7.5 mg via INTRAVENOUS
  Filled 2019-06-08 (×4): qty 1

## 2019-06-08 MED ORDER — PROPOFOL 500 MG/50ML IV EMUL
INTRAVENOUS | Status: DC | PRN
  Administered 2019-06-08: 100 ug/kg/min via INTRAVENOUS

## 2019-06-08 MED ORDER — ATORVASTATIN CALCIUM 40 MG PO TABS
40.0000 mg | ORAL_TABLET | Freq: Every evening | ORAL | Status: DC
  Administered 2019-06-08: 40 mg via ORAL
  Filled 2019-06-08: qty 1

## 2019-06-08 MED ORDER — TRIAMCINOLONE ACETONIDE 55 MCG/ACT NA AERO
2.0000 | INHALATION_SPRAY | Freq: Every day | NASAL | Status: DC
  Filled 2019-06-08: qty 21.6

## 2019-06-08 MED ORDER — TRANEXAMIC ACID-NACL 1000-0.7 MG/100ML-% IV SOLN
INTRAVENOUS | Status: AC
  Filled 2019-06-08: qty 100

## 2019-06-08 MED ORDER — ACETAMINOPHEN 500 MG PO TABS
500.0000 mg | ORAL_TABLET | Freq: Four times a day (QID) | ORAL | Status: AC
  Administered 2019-06-08 – 2019-06-09 (×4): 500 mg via ORAL
  Filled 2019-06-08 (×4): qty 1

## 2019-06-08 MED ORDER — SODIUM CHLORIDE (PF) 0.9 % IJ SOLN
INTRAMUSCULAR | Status: AC
  Filled 2019-06-08: qty 30

## 2019-06-08 MED ORDER — MORPHINE SULFATE (PF) 2 MG/ML IV SOLN: 0.5000 mg | INTRAVENOUS | Status: DC | PRN

## 2019-06-08 MED ORDER — PHENYLEPHRINE 40 MCG/ML (10ML) SYRINGE FOR IV PUSH (FOR BLOOD PRESSURE SUPPORT)
PREFILLED_SYRINGE | INTRAVENOUS | Status: AC
  Filled 2019-06-08: qty 10

## 2019-06-08 MED ORDER — ONDANSETRON HCL 4 MG/2ML IJ SOLN: 4.0000 mg | Freq: Four times a day (QID) | INTRAMUSCULAR | Status: DC | PRN

## 2019-06-08 MED ORDER — PHENYLEPHRINE HCL-NACL 10-0.9 MG/250ML-% IV SOLN
INTRAVENOUS | Status: AC
  Filled 2019-06-08: qty 250

## 2019-06-08 MED ORDER — PHENYLEPHRINE HCL-NACL 10-0.9 MG/250ML-% IV SOLN
INTRAVENOUS | Status: DC | PRN
  Administered 2019-06-08: 50 ug/min via INTRAVENOUS

## 2019-06-08 MED ORDER — PHENYLEPHRINE HCL (PRESSORS) 10 MG/ML IV SOLN
INTRAVENOUS | Status: DC | PRN
  Administered 2019-06-08: 80 ug via INTRAVENOUS
  Administered 2019-06-08: 40 ug via INTRAVENOUS
  Administered 2019-06-08 (×2): 80 ug via INTRAVENOUS
  Administered 2019-06-08: 40 ug via INTRAVENOUS

## 2019-06-08 MED ORDER — BUPIVACAINE LIPOSOME 1.3 % IJ SUSP
INTRAMUSCULAR | Status: DC | PRN
  Administered 2019-06-08: 20 mL

## 2019-06-08 MED ORDER — ONDANSETRON HCL 4 MG/2ML IJ SOLN
INTRAMUSCULAR | Status: AC
  Filled 2019-06-08: qty 2

## 2019-06-08 MED ORDER — PHENOL 1.4 % MT LIQD: 1.0000 | OROMUCOSAL | Status: DC | PRN

## 2019-06-08 SURGICAL SUPPLY — 49 items
ATTUNE MED DOME PAT 41 KNEE (Knees) ×2 IMPLANT
ATTUNE PS FEM RT SZ 7 CEM KNEE (Femur) ×2 IMPLANT
ATTUNE PSRP INSR SZ7 7 KNEE (Insert) ×2 IMPLANT
BAG DECANTER FOR FLEXI CONT (MISCELLANEOUS) ×2 IMPLANT
BAG ZIPLOCK 12X15 (MISCELLANEOUS) ×2 IMPLANT
BASE TIBIAL ROT PLAT SZ 8 KNEE (Knees) ×1 IMPLANT
BLADE SAGITTAL 25.0X1.19X90 (BLADE) ×2 IMPLANT
BLADE SAW SGTL 11.0X1.19X90.0M (BLADE) ×2 IMPLANT
BLADE SURG SZ10 CARB STEEL (BLADE) ×2 IMPLANT
BNDG ELASTIC 6X5.8 VLCR STR LF (GAUZE/BANDAGES/DRESSINGS) ×2 IMPLANT
BOOTIES KNEE HIGH SLOAN (MISCELLANEOUS) ×2 IMPLANT
BOWL SMART MIX CTS (DISPOSABLE) ×2 IMPLANT
CEMENT HV SMART SET (Cement) ×4 IMPLANT
COVER SURGICAL LIGHT HANDLE (MISCELLANEOUS) ×2 IMPLANT
COVER WAND RF STERILE (DRAPES) ×2 IMPLANT
CUFF TOURN SGL QUICK 34 (TOURNIQUET CUFF) ×1
CUFF TRNQT CYL 34X4.125X (TOURNIQUET CUFF) ×1 IMPLANT
DECANTER SPIKE VIAL GLASS SM (MISCELLANEOUS) ×4 IMPLANT
DRAPE SHEET LG 3/4 BI-LAMINATE (DRAPES) ×2 IMPLANT
DRAPE TOP 10253 STERILE (DRAPES) ×2 IMPLANT
DRAPE U-SHAPE 47X51 STRL (DRAPES) ×2 IMPLANT
DRSG AQUACEL AG ADV 3.5X10 (GAUZE/BANDAGES/DRESSINGS) ×2 IMPLANT
DURAPREP 26ML APPLICATOR (WOUND CARE) ×4 IMPLANT
ELECT REM PT RETURN 15FT ADLT (MISCELLANEOUS) ×2 IMPLANT
GLOVE BIO SURGEON STRL SZ8 (GLOVE) ×4 IMPLANT
GLOVE BIOGEL PI IND STRL 8 (GLOVE) ×2 IMPLANT
GLOVE BIOGEL PI INDICATOR 8 (GLOVE) ×2
GOWN STRL REUS W/TWL XL LVL3 (GOWN DISPOSABLE) ×4 IMPLANT
HANDPIECE INTERPULSE COAX TIP (DISPOSABLE) ×1
HOLDER FOLEY CATH W/STRAP (MISCELLANEOUS) IMPLANT
HOOD PEEL AWAY FLYTE STAYCOOL (MISCELLANEOUS) ×6 IMPLANT
KIT TURNOVER KIT A (KITS) IMPLANT
MANIFOLD NEPTUNE II (INSTRUMENTS) ×2 IMPLANT
PACK TOTAL KNEE CUSTOM (KITS) ×2 IMPLANT
PAD ARMBOARD 7.5X6 YLW CONV (MISCELLANEOUS) ×2 IMPLANT
PENCIL SMOKE EVACUATOR (MISCELLANEOUS) IMPLANT
PIN STEINMAN FIXATION KNEE (PIN) ×2 IMPLANT
PIN THREADED HEADED SIGMA (PIN) ×2 IMPLANT
PROTECTOR NERVE ULNAR (MISCELLANEOUS) ×2 IMPLANT
SET HNDPC FAN SPRY TIP SCT (DISPOSABLE) ×1 IMPLANT
SUT ETHIBOND NAB CT1 #1 30IN (SUTURE) ×4 IMPLANT
SUT VIC AB 0 CT1 36 (SUTURE) ×2 IMPLANT
SUT VIC AB 2-0 CT1 27 (SUTURE) ×1
SUT VIC AB 2-0 CT1 TAPERPNT 27 (SUTURE) ×1 IMPLANT
SUT VIC AB 3-0 CT1 27 (SUTURE) ×1
SUT VIC AB 3-0 CT1 TAPERPNT 27 (SUTURE) ×1 IMPLANT
TIBIAL BASE ROT PLAT SZ 8 KNEE (Knees) ×2 IMPLANT
WRAP KNEE MAXI GEL POST OP (GAUZE/BANDAGES/DRESSINGS) ×2 IMPLANT
YANKAUER SUCT BULB TIP 10FT TU (MISCELLANEOUS) ×2 IMPLANT

## 2019-06-08 NOTE — Op Note (Signed)
PREOP DIAGNOSIS: DJD RIGHT KNEE POSTOP DIAGNOSIS: same PROCEDURE: RIGHT TKR ANESTHESIA: General ATTENDING SURGEON: Hessie Dibble ASSISTANT: Nicholas Dolly PA  INDICATIONS FOR PROCEDURE: Nicholas Wise is a 73 y.o. male who has struggled for a long time with pain due to degenerative arthritis of the right knee.  The patient has failed many conservative non-operative measures and at this point has pain which limits the ability to sleep and walk.  The patient is offered total knee replacement.  Informed operative consent was obtained after discussion of possible risks of anesthesia, infection, neurovascular injury, DVT, and death.  The importance of the post-operative rehabilitation protocol to optimize result was stressed extensively with the patient.  SUMMARY OF FINDINGS AND PROCEDURE:  Nicholas Wise was taken to the operative suite where under the above anesthesia a right knee replacement was performed due to aortic stenosis.  There were advanced degenerative changes and the bone quality was excellent.  We used the DePuy Attune system and placed size 7 femur, 8 tibia, 41 mm all polyethylene patella, and a size 7 mm spacer.  Nicholas Dolly PA-C assisted throughout and was invaluable to the completion of the case in that he helped retract and maintain exposure while I placed components.  He also helped close thereby minimizing OR time.  The patient was admitted for appropriate post-op care to include perioperative antibiotics and mechanical and pharmacologic measures for DVT prophylaxis.  DESCRIPTION OF PROCEDURE:  Nicholas Wise was taken to the operative suite where the above anesthesia was applied.  The patient was positioned supine and prepped and draped in normal sterile fashion.  An appropriate time out was performed.  After the administration of kefzol pre-op antibiotic the leg was elevated and exsanguinated and a tourniquet inflated. A standard longitudinal incision was made on the anterior knee.   Dissection was carried down to the extensor mechanism.  All appropriate anti-infective measures were used including the pre-operative antibiotic, betadine impregnated drape, and closed hooded exhaust systems for each member of the surgical team.  A medial parapatellar incision was made in the extensor mechanism and the knee cap flipped and the knee flexed.  Some residual meniscal tissues were removed along with any remaining ACL/PCL tissue.  A guide was placed on the tibia and a flat cut was made on it's superior surface.  An intramedullary guide was placed in the femur and was utilized to make anterior and posterior cuts creating an appropriate flexion gap.  A second intramedullary guide was placed in the femur to make a distal cut properly balancing the knee with an extension gap equal to the flexion gap.  The three bones sized to the above mentioned sizes and the appropriate guides were placed and utilized.  A trial reduction was done and the knee easily came to full extension and the patella tracked well on flexion.  The trial components were removed and all bones were cleaned with pulsatile lavage and then dried thoroughly.  Cement was mixed and was pressurized onto the bones followed by placement of the aforementioned components.  Excess cement was trimmed and pressure was held on the components until the cement had hardened.  The tourniquet was deflated and a small amount of bleeding was controlled with cautery and pressure.  The knee was irrigated thoroughly.  The extensor mechanism was re-approximated with #1 ethibond in interrupted fashion.  The knee was flexed and the repair was solid.  The subcutaneous tissues were re-approximated with #0 and #2-0 vicryl and the skin  closed with a subcuticular stitch and steristrips.  A sterile dressing was applied.  Intraoperative fluids, EBL, and tourniquet time can be obtained from anesthesia records.  DISPOSITION:  The patient was taken to recovery room in stable  condition and admitted for appropriate post-op care to include peri-operative antibiotic and DVT prophylaxis with mechanical and pharmacologic measures.  [name] 06/08/2019, 9:22 AM

## 2019-06-08 NOTE — Evaluation (Signed)
Physical Therapy Evaluation Patient Details Name: Nicholas Wise MRN: [id number] DOB: March 14, 1946 Today's Date: 06/08/2019   History of Present Illness  Patient is 73 y.o male s/p  Rt TKA with PMH significant for TIA, OA, and ACDF C3-6, Lumbar fusion L4-5.  Clinical Impression  [name] is a 73 y.o. male POD 0 s/p Rt TKA. Patient reports independence with mobility at baseline. Patient is now limited by functional impairments (see PT problem list below) and requires min assist/gaurd for transfers and gait with RW. Patient was able to ambulate ~80 feet with RW and min guard/assist with cues for safe proximity to walker. Patient instructed in exercise to facilitate ROM and circulation. Patient will benefit from continued skilled PT interventions to address impairments and progress towards PLOF. Acute PT will follow to progress mobility and stair training in preparation for safe discharge home.    Follow Up Recommendations Follow surgeon's recommendation for DC plan and follow-up therapies    Equipment Recommendations  None recommended by PT    Recommendations for Other Services       Precautions / Restrictions Precautions Precautions: Fall Restrictions Weight Bearing Restrictions: No      Mobility  Bed Mobility Overal bed mobility: Needs Assistance Bed Mobility: Supine to Sit     Supine to sit: HOB elevated;Supervision     General bed mobility comments: cues for use of bed rails, no assist required  Transfers Overall transfer level: Needs assistance Equipment used: Rolling walker (2 wheeled) Transfers: Sit to/from Stand Sit to Stand: Min assist         General transfer comment: cues for safe hand placement and technique with RW, assist to initiate power up and pt able to complete rise  Ambulation/Gait Ambulation/Gait assistance: Min assist;Min guard Gait Distance (Feet): 80 Feet Assistive device: Rolling walker (2 wheeled) Gait Pattern/deviations: Step-through  pattern;Decreased step length - left;Decreased stance time - right;Decreased stride length Gait velocity: decreased   General Gait Details: verbal cues for safe step pattern with RW and to maintain safe proximity to walker as he had tendency to step beyond the front of the walker. no overt LOB noted.  Stairs            Wheelchair Mobility    Modified Rankin (Stroke Patients Only)       Balance Overall balance assessment: Needs assistance Sitting-balance support: Feet supported;No upper extremity supported Sitting balance-Leahy Scale: Good     Standing balance support: During functional activity;Bilateral upper extremity supported Standing balance-Leahy Scale: Fair                  Pertinent Vitals/Pain Pain Assessment: No/denies pain    Home Living Family/patient expects to be discharged to:: Private residence Living Arrangements: Spouse/significant other Available Help at Discharge: Family;Available 24 hours/day Type of Home: House Home Access: Stairs to enter Entrance Stairs-Rails: None Entrance Stairs-Number of Steps: 1 Home Layout: One level Home Equipment: Cane - single point;Walker - 2 wheels;Bedside commode;Shower seat - built in;Shower seat;Grab bars - tub/shower;Grab bars - toilet;Toilet riser;Walker - 4 wheels      Prior Function Level of Independence: Independent         Comments: Pt is independent with all mobility and ADL's, he was walking ~3-5 miles per day until about 6+ months ago and had to stop due to knee pain.     Hand Dominance   Dominant Hand: Right    Extremity/Trunk Assessment   Upper Extremity Assessment Upper Extremity Assessment: Overall WFL for tasks  assessed    Lower Extremity Assessment Lower Extremity Assessment: Overall WFL for tasks assessed;RLE deficits/detail RLE Deficits / Details: pt with good quad acticvation in supine, no extensor lag with SLR, 4-/5 for quad strength with MMT RLE Sensation: WNL RLE  Coordination: WNL    Cervical / Trunk Assessment Cervical / Trunk Assessment: Normal  Communication   Communication: No difficulties  Cognition Arousal/Alertness: Awake/alert Behavior During Therapy: WFL for tasks assessed/performed Overall Cognitive Status: Within Functional Limits for tasks assessed               General Comments      Exercises Total Joint Exercises Ankle Circles/Pumps: AROM;15 reps;Seated;Both Quad Sets: AROM;10 reps;Seated;Right Heel Slides: AAROM;10 reps;Right;Seated   Assessment/Plan    PT Assessment Patient needs continued PT services  PT Problem List Decreased strength;Decreased balance;Decreased range of motion;Decreased mobility;Decreased activity tolerance;Decreased knowledge of use of DME       PT Treatment Interventions DME instruction;Functional mobility training;Balance training;Patient/family education;Modalities;Gait training;Therapeutic exercise;Stair training;Therapeutic activities    PT Goals (Current goals can be found in the Care Plan section)  Acute Rehab PT Goals Patient Stated Goal: to get back to walking for exercise and working around the house/yard PT Goal Formulation: With patient Time For Goal Achievement: 06/15/19 Potential to Achieve Goals: Good    Frequency 7X/week    AM-PAC PT "6 Clicks" Mobility  Outcome Measure Help needed turning from your back to your side while in a flat bed without using bedrails?: A Little Help needed moving from lying on your back to sitting on the side of a flat bed without using bedrails?: A Little Help needed moving to and from a bed to a chair (including a wheelchair)?: A Little Help needed standing up from a chair using your arms (e.g., wheelchair or bedside chair)?: A Little Help needed to walk in hospital room?: A Little Help needed climbing 3-5 steps with a railing? : A Little 6 Click Score: 18    End of Session Equipment Utilized During Treatment: Gait belt Activity Tolerance:  Patient tolerated treatment well Patient left: in chair;with call bell/phone within reach;with chair alarm set;with family/visitor present Nurse Communication: Mobility status PT Visit Diagnosis: Muscle weakness (generalized) (M62.81);Difficulty in walking, not elsewhere classified (R26.2)    Time: [id number] PT Time Calculation (min) (ACUTE ONLY): 31 min   Charges:   PT Evaluation $PT Eval Low Complexity: 1 Low PT Treatments $Therapeutic Exercise: 8-22 mins       Kipp Brood, PT, DPT Physical Therapist with [facility]  06/08/2019 1:56 PM

## 2019-06-08 NOTE — Interval H&P Note (Signed)
History and Physical Interval Note:  06/08/2019 7:35 AM  Nicholas Wise  has presented today for surgery, with the diagnosis of RIGHT KNEE DEGENERATIVE JOINT DISEASE.  The various methods of treatment have been discussed with the patient and family. After consideration of risks, benefits and other options for treatment, the patient has consented to  Procedure(s): RIGHT TOTAL KNEE ARTHROPLASTY (Right) as a surgical intervention.  The patient's history has been reviewed, patient examined, no change in status, stable for surgery.  I have reviewed the patient's chart and labs.  Questions were answered to the patient's satisfaction.     Hessie Dibble

## 2019-06-08 NOTE — Anesthesia Procedure Notes (Signed)
Procedure Name: LMA Insertion Date/Time: 06/08/2019 7:45 AM Performed by: Talbot Grumbling, CRNA Pre-anesthesia Checklist: Patient identified, Emergency Drugs available, Suction available and Patient being monitored Patient Re-evaluated:Patient Re-evaluated prior to induction Oxygen Delivery Method: Circle system utilized Preoxygenation: Pre-oxygenation with 100% oxygen Induction Type: IV induction Ventilation: Mask ventilation without difficulty LMA: LMA inserted and LMA with gastric port inserted LMA Size: 4.0 Number of attempts: 1 Placement Confirmation: positive ETCO2 and breath sounds checked- equal and bilateral Tube secured with: Tape Dental Injury: Teeth and Oropharynx as per pre-operative assessment

## 2019-06-08 NOTE — Transfer of Care (Signed)
Immediate Anesthesia Transfer of Care Note  Patient: Nicholas Wise  Procedure(s) Performed: RIGHT TOTAL KNEE ARTHROPLASTY (Right Knee)  Patient Location: PACU  Anesthesia Type:General  Level of Consciousness: sedated  Airway & Oxygen Therapy: Patient Spontanous Breathing and Patient connected to face mask oxygen  Post-op Assessment: Report given to RN and Post -op Vital signs reviewed and stable  Post vital signs: Reviewed and stable  Last Vitals:  Vitals Value Taken Time  BP 110/69 06/08/19 0952  Temp    Pulse 86 06/08/19 0953  Resp 11 06/08/19 0953  SpO2 99 % 06/08/19 0953  Vitals shown include unvalidated device data.  Last Pain:  Vitals:   06/08/19 [id number]  TempSrc: Oral  PainSc:       Patients Stated Pain Goal: 5 ([id number])  Complications: No apparent anesthesia complications

## 2019-06-08 NOTE — Plan of Care (Signed)
POC initiated 

## 2019-06-08 NOTE — Anesthesia Procedure Notes (Signed)
Anesthesia Regional Block: Adductor canal block   Pre-Anesthetic Checklist: ,, timeout performed, Correct Patient, Correct Site, Correct Laterality, Correct Procedure, Correct Position, site marked, Risks and benefits discussed,  Surgical consent,  Pre-op evaluation,  At surgeon's request and post-op pain management  Laterality: Right  Prep: chloraprep       Needles:  Injection technique: Single-shot  Needle Type: Stimiplex     Needle Length: 9cm  Needle Gauge: 21     Additional Needles:   Procedures:,,,, ultrasound used (permanent image in chart),,,,  Narrative:  Start time: 06/08/2019 7:07 AM End time: 06/08/2019 7:12 AM Injection made incrementally with aspirations every 5 mL.  Performed by: Personally  Anesthesiologist: Nolon Nations, MD  Additional Notes: BP cuff, EKG monitors applied. Sedation begun. Artery and nerve location verified with U/S and anesthetic injected incrementally, slowly, and after negative aspirations under direct u/s guidance. Good fascial /perineural spread. Tolerated well.

## 2019-06-08 NOTE — Care Plan (Signed)
Ortho Bundle Case Management Note  Patient Details  Name: Nicholas Wise MRN: [id number] Date of Birth: [birth year]  Spoke with patient prior to surgery. He plans to discharge to home with family and HHPT. Referral to [facility]. He has all needed equipment at home. OPPT has been arranged to start after office follow up.  Choice offered. Patient and MD in agreement with plan.   [place] contact -- [phone] P                                                     [phone]  F                 DME Arranged:    DME Agency:     HH Arranged:  PT HH Agency:  [facility]  Additional Comments: Please contact me with any questions of if this plan should need to change.  Ladell Heads,  [facility]  [phone] 06/08/2019, 8:17 AM

## 2019-06-09 ENCOUNTER — Encounter (HOSPITAL_COMMUNITY): Payer: Self-pay | Admitting: Orthopaedic Surgery

## 2019-06-09 DIAGNOSIS — M1711 Unilateral primary osteoarthritis, right knee: Secondary | ICD-10-CM | POA: Diagnosis not present

## 2019-06-09 MED ORDER — HYDROCODONE-ACETAMINOPHEN 5-325 MG PO TABS: 1.0000 | ORAL_TABLET | Freq: Four times a day (QID) | ORAL | 0 refills | Status: DC | PRN

## 2019-06-09 MED ORDER — ASPIRIN 81 MG PO CHEW: 81.0000 mg | CHEWABLE_TABLET | Freq: Two times a day (BID) | ORAL | 0 refills | Status: DC

## 2019-06-09 MED ORDER — TIZANIDINE HCL 4 MG PO TABS: 4.0000 mg | ORAL_TABLET | Freq: Four times a day (QID) | ORAL | 1 refills | Status: DC | PRN

## 2019-06-09 NOTE — Progress Notes (Signed)
Physical Therapy Treatment Patient Details Name: Nicholas Wise MRN: [id number] DOB: 14-Mar-1946 Today's Date: 06/09/2019    History of Present Illness Patient is 73 y.o male s/p  Rt TKA with PMH significant for TIA, OA, and ACDF C3-6, Lumbar fusion L4-5.    PT Comments    Pt performed LE exercises with HEP handout.  Pt ambulated again in hallway.  Pt and spouse feel ready for pt to d/c home today.  Pt had no further questions.    Follow Up Recommendations  Follow surgeon's recommendation for DC plan and follow-up therapies     Equipment Recommendations  None recommended by PT    Recommendations for Other Services       Precautions / Restrictions Precautions Precautions: Fall;Knee Restrictions Weight Bearing Restrictions: No RLE Weight Bearing: Weight bearing as tolerated    Mobility  Bed Mobility               General bed mobility comments: pt in recliner on arrival  Transfers Overall transfer level: Needs assistance Equipment used: Rolling walker (2 wheeled) Transfers: Sit to/from Stand Sit to Stand: Supervision         General transfer comment: verbal cues for UE and LE positioning  Ambulation/Gait Ambulation/Gait assistance: Supervision Gait Distance (Feet): 240 Feet Assistive device: Rolling walker (2 wheeled) Gait Pattern/deviations: Step-through pattern;Decreased stance time - right;Decreased stride length     General Gait Details: verbal cues for RW positioning, posture, right knee flexion in swing, heel strike   Stairs Stairs: Yes Stairs assistance: Min guard Stair Management: Step to pattern;Forwards;With walker Number of Stairs: 1 General stair comments: verbal cues for sequence, RW positioning, safety; pt performed twice and reports understanding   Wheelchair Mobility    Modified Rankin (Stroke Patients Only)       Balance                                            Cognition Arousal/Alertness:  Awake/alert Behavior During Therapy: WFL for tasks assessed/performed Overall Cognitive Status: Within Functional Limits for tasks assessed                                        Exercises Total Joint Exercises Ankle Circles/Pumps: AROM;15 reps;Seated;Both Quad Sets: AROM;10 reps;Seated;Right Short Arc Quad: AROM;Right;10 reps Heel Slides: AAROM;10 reps;Right Hip ABduction/ADduction: AROM;Right;10 reps Straight Leg Raises: AROM;Right;10 reps Long Arc Quad: AROM;Right;Seated;10 reps Knee Flexion: AROM;Right;5 reps;Seated    General Comments        Pertinent Vitals/Pain Pain Assessment: 0-10 Pain Score: 3  Pain Location: posterior knee Pain Descriptors / Indicators: Sore;Tightness Pain Intervention(s): Repositioned;Monitored during session    Home Living                      Prior Function            PT Goals (current goals can now be found in the care plan section) Progress towards PT goals: Progressing toward goals    Frequency    7X/week      PT Plan Current plan remains appropriate    Co-evaluation              AM-PAC PT "6 Clicks" Mobility   Outcome Measure  Help needed turning from your back to your side  while in a flat bed without using bedrails?: A Little Help needed moving from lying on your back to sitting on the side of a flat bed without using bedrails?: A Little Help needed moving to and from a bed to a chair (including a wheelchair)?: A Little Help needed standing up from a chair using your arms (e.g., wheelchair or bedside chair)?: A Little Help needed to walk in hospital room?: A Little Help needed climbing 3-5 steps with a railing? : A Little 6 Click Score: 18    End of Session Equipment Utilized During Treatment: Gait belt Activity Tolerance: Patient tolerated treatment well Patient left: in chair;with call bell/phone within reach;with family/visitor present Nurse Communication: Mobility status PT Visit  Diagnosis: Muscle weakness (generalized) (M62.81);Difficulty in walking, not elsewhere classified (R26.2)     Time: [id number] PT Time Calculation (min) (ACUTE ONLY): 23 min  Charges:  $Gait Training: 8-22 mins $Therapeutic Exercise: 8-22 mins                    Carmelia Bake, PT, DPT Acute Rehabilitation Services Office: [phone] Pager: [phone]  Trena Platt 06/09/2019, 1:02 PM

## 2019-06-09 NOTE — Discharge Summary (Signed)
Patient ID: Nicholas Wise MRN: [id number] DOB/AGE: 1946/03/23 73 y.o.  Admit date: 06/08/2019 Discharge date: 06/09/2019  Admission Diagnoses:  Principal Problem:   Primary localized osteoarthritis of right knee Active Problems:   Primary osteoarthritis of right knee   Discharge Diagnoses:  Same  Past Medical History:  Diagnosis Date  . Anxiety   . Aortic stenosis   . Arthritis   . Depression   . Hemorrhoids   . Hiatal hernia   . High cholesterol   . History of kidney stones   . Hx of adenomatous polyp of colon 01/31/2017  . Numbness and tingling in left hand   . PONV (postoperative nausea and vomiting)    bladder spasms went home after neck surgery w/ catheter  . Stroke ([place]) 11/09/2013   Tia   . TIA (transient ischemic attack)   . Urinary frequency     Surgeries: Procedure(s): RIGHT TOTAL KNEE ARTHROPLASTY on 06/08/2019   Consultants:   Discharged Condition: Improved  Hospital Course: Nicholas Wise is an 73 y.o. male who was admitted 06/08/2019 for operative treatment ofPrimary localized osteoarthritis of right knee. Patient has severe unremitting pain that affects sleep, daily activities, and work/hobbies. After pre-op clearance the patient was taken to the operating room on 06/08/2019 and underwent  Procedure(s): RIGHT TOTAL KNEE ARTHROPLASTY.    Patient was given perioperative antibiotics:  Anti-infectives (From admission, onward)   Start     Dose/Rate Route Frequency Ordered Stop   06/08/19 1400  ceFAZolin (ANCEF) IVPB 2g/100 mL premix     2 g 200 mL/hr over 30 Minutes Intravenous Every 6 hours 06/08/19 1131 06/08/19 2141   06/08/19 0600  ceFAZolin (ANCEF) IVPB 2g/100 mL premix     2 g 200 mL/hr over 30 Minutes Intravenous On call to O.R. 06/08/19 0524 06/08/19 0801   06/08/19 0527  ceFAZolin (ANCEF) 2-4 GM/100ML-% IVPB    Note to Pharmacy: Kyra Leyland   : cabinet override      06/08/19 0527 06/08/19 0746       Patient was given sequential  compression devices, early ambulation, and chemoprophylaxis to prevent DVT.  Patient benefited maximally from hospital stay and there were no complications.    Recent vital signs:  Patient Vitals for the past 24 hrs:  BP Temp Temp src Pulse Resp SpO2 Height Weight  06/09/19 0450 (!) 144/70 98.2 F (36.8 C) Oral 65 18 96 % - -  06/09/19 0002 118/68 98.4 F (36.9 C) Oral 65 18 93 % - -  06/08/19 2026 127/61 (!) 97.5 F (36.4 C) Oral 68 18 95 % - -  06/08/19 1711 129/69 97.6 F (36.4 C) - 71 16 96 % - -  06/08/19 1431 126/63 97.8 F (36.6 C) Oral 79 16 97 % - -  06/08/19 1309 124/81 - - 85 16 100 % - -  06/08/19 1223 - - - - - - 5\' 9"  (1.753 m) 79.1 kg  06/08/19 1213 (!) 152/81 (!) 96.6 F (35.9 C) Axillary 91 16 99 % - -  06/08/19 1120 140/89 97.7 F (36.5 C) Oral 86 15 99 % - -  06/08/19 1045 123/80 97.8 F (36.6 C) - 90 17 99 % - -  06/08/19 1030 130/75 - - 84 18 99 % - -  06/08/19 1015 123/78 - - 81 16 99 % - -  06/08/19 1000 116/69 - - 84 19 99 % - -  06/08/19 0952 110/69 97.9 F (36.6 C) - 85 12 100 % - -  Recent laboratory studies: No results for input(s): WBC, HGB, HCT, PLT, NA, K, CL, CO2, BUN, CREATININE, GLUCOSE, INR, CALCIUM in the last 72 hours.  Invalid input(s): PT, 2   Discharge Medications:   Allergies as of 06/09/2019   No Known Allergies     Medication List    TAKE these medications   acetaminophen 500 MG tablet Commonly known as: TYLENOL Take 500 mg by mouth as needed for mild pain or headache.   allopurinol 300 MG tablet Commonly known as: ZYLOPRIM Take 300 mg by mouth daily.   ALPRAZolam 0.5 MG tablet Commonly known as: XANAX Take 0.25-0.5 mg by mouth daily as needed for anxiety.   aspirin 81 MG chewable tablet Chew 1 tablet (81 mg total) by mouth 2 (two) times daily.   atorvastatin 40 MG tablet Commonly known as: LIPITOR Take 40 mg by mouth every evening.   clopidogrel 75 MG tablet Commonly known as: PLAVIX Take 1 tablet (75 mg  total) by mouth daily.   finasteride 5 MG tablet Commonly known as: PROSCAR Take 5 mg by mouth 2 (two) times daily.   HYDROcodone-acetaminophen 5-325 MG tablet Commonly known as: NORCO/VICODIN Take 1-2 tablets by mouth every 6 (six) hours as needed for moderate pain (pain score 4-6).   potassium citrate 10 MEQ (1080 MG) SR tablet Commonly known as: UROCIT-K Take 10 mEq by mouth 2 (two) times daily.   sertraline 100 MG tablet Commonly known as: ZOLOFT Take 100 mg by mouth at bedtime.   SYSTANE OP Place 1 drop into both eyes daily.   tamsulosin 0.4 MG Caps capsule Commonly known as: FLOMAX Take 0.4 mg by mouth 2 (two) times daily.   tiZANidine 4 MG tablet Commonly known as: Zanaflex Take 1 tablet (4 mg total) by mouth every 6 (six) hours as needed.   triamcinolone 55 MCG/ACT Aero nasal inhaler Commonly known as: NASACORT Place 2 sprays into the nose daily.            Durable Medical Equipment  (From admission, onward)         Start     Ordered   06/08/19 1132  DME Walker rolling  Once    Question:  Patient needs a walker to treat with the following condition  Answer:  Primary osteoarthritis of right knee   06/08/19 1131   06/08/19 1132  DME 3 n 1  Once     06/08/19 1131   06/08/19 1132  DME Bedside commode  Once    Question:  Patient needs a bedside commode to treat with the following condition  Answer:  Primary osteoarthritis of right knee   06/08/19 1131          Diagnostic Studies: Dg Chest 2 View  Result Date: 06/04/2019 CLINICAL DATA:  Preoperative, total knee replacement EXAM: CHEST - 2 VIEW COMPARISON:  04/01/2014 FINDINGS: The heart size and mediastinal contours are within normal limits. Both lungs are clear. Disc degenerative disease of the thoracic spine. IMPRESSION: No acute abnormality of the lungs. Electronically Signed   By: Eddie Candle M.D.   On: 06/04/2019 16:56    Disposition: Discharge disposition: 01-Home or Self  Care       Discharge Instructions    Call MD / Call 911   Complete by: As directed    If you experience chest pain or shortness of breath, CALL 911 and be transported to the hospital emergency room.  If you develope a fever above 101 F, pus (white drainage) or  increased drainage or redness at the wound, or calf pain, call your surgeon's office.   Constipation Prevention   Complete by: As directed    Drink plenty of fluids.  Prune juice may be helpful.  You may use a stool softener, such as Colace (over the counter) 100 mg twice a day.  Use MiraLax (over the counter) for constipation as needed.   Diet - low sodium heart healthy   Complete by: As directed    Discharge instructions   Complete by: As directed    INSTRUCTIONS AFTER JOINT REPLACEMENT   Remove items at home which could result in a fall. This includes throw rugs or furniture in walking pathways ICE to the affected joint every three hours while awake for 30 minutes at a time, for at least the first 3-5 days, and then as needed for pain and swelling.  Continue to use ice for pain and swelling. You may notice swelling that will progress down to the foot and ankle.  This is normal after surgery.  Elevate your leg when you are not up walking on it.   Continue to use the breathing machine you got in the hospital (incentive spirometer) which will help keep your temperature down.  It is common for your temperature to cycle up and down following surgery, especially at night when you are not up moving around and exerting yourself.  The breathing machine keeps your lungs expanded and your temperature down.   DIET:  As you were doing prior to hospitalization, we recommend a well-balanced diet.  DRESSING / WOUND CARE / SHOWERING  You may shower 3 days after surgery, but keep the wounds dry during showering.  You may use an occlusive plastic wrap (Press'n Seal for example), NO SOAKING/SUBMERGING IN THE BATHTUB.  If the bandage gets wet,  change with a clean dry gauze.  If the incision gets wet, pat the wound dry with a clean towel.  ACTIVITY  Increase activity slowly as tolerated, but follow the weight bearing instructions below.   No driving for 6 weeks or until further direction given by your physician.  You cannot drive while taking narcotics.  No lifting or carrying greater than 10 lbs. until further directed by your surgeon. Avoid periods of inactivity such as sitting longer than an hour when not asleep. This helps prevent blood clots.  You may return to work once you are authorized by your doctor.     WEIGHT BEARING   Weight bearing as tolerated with assist device (walker, cane, etc) as directed, use it as long as suggested by your surgeon or therapist, typically at least 4-6 weeks.   EXERCISES  Results after joint replacement surgery are often greatly improved when you follow the exercise, range of motion and muscle strengthening exercises prescribed by your doctor. Safety measures are also important to protect the joint from further injury. Any time any of these exercises cause you to have increased pain or swelling, decrease what you are doing until you are comfortable again and then slowly increase them. If you have problems or questions, call your caregiver or physical therapist for advice.   Rehabilitation is important following a joint replacement. After just a few days of immobilization, the muscles of the leg can become weakened and shrink (atrophy).  These exercises are designed to build up the tone and strength of the thigh and leg muscles and to improve motion. Often times heat used for twenty to thirty minutes before working out will loosen up  your tissues and help with improving the range of motion but do not use heat for the first two weeks following surgery (sometimes heat can increase post-operative swelling).   These exercises can be done on a training (exercise) mat, on the floor, on a table or on a  bed. Use whatever works the best and is most comfortable for you.    Use music or television while you are exercising so that the exercises are a pleasant break in your day. This will make your life better with the exercises acting as a break in your routine that you can look forward to.   Perform all exercises about fifteen times, three times per day or as directed.  You should exercise both the operative leg and the other leg as well.   Exercises include:   Quad Sets - Tighten up the muscle on the front of the thigh (Quad) and hold for 5-10 seconds.   Straight Leg Raises - With your knee straight (if you were given a brace, keep it on), lift the leg to 60 degrees, hold for 3 seconds, and slowly lower the leg.  Perform this exercise against resistance later as your leg gets stronger.  Leg Slides: Lying on your back, slowly slide your foot toward your buttocks, bending your knee up off the floor (only go as far as is comfortable). Then slowly slide your foot back down until your leg is flat on the floor again.  Angel Wings: Lying on your back spread your legs to the side as far apart as you can without causing discomfort.  Hamstring Strength:  Lying on your back, push your heel against the floor with your leg straight by tightening up the muscles of your buttocks.  Repeat, but this time bend your knee to a comfortable angle, and push your heel against the floor.  You may put a pillow under the heel to make it more comfortable if necessary.   A rehabilitation program following joint replacement surgery can speed recovery and prevent re-injury in the future due to weakened muscles. Contact your doctor or a physical therapist for more information on knee rehabilitation.    CONSTIPATION  Constipation is defined medically as fewer than three stools per week and severe constipation as less than one stool per week.  Even if you have a regular bowel pattern at home, your normal regimen is likely to be  disrupted due to multiple reasons following surgery.  Combination of anesthesia, postoperative narcotics, change in appetite and fluid intake all can affect your bowels.   YOU MUST use at least one of the following options; they are listed in order of increasing strength to get the job done.  They are all available over the counter, and you may need to use some, POSSIBLY even all of these options:    Drink plenty of fluids (prune juice may be helpful) and high fiber foods Colace 100 mg by mouth twice a day  Senokot for constipation as directed and as needed Dulcolax (bisacodyl), take with full glass of water  Miralax (polyethylene glycol) once or twice a day as needed.  If you have tried all these things and are unable to have a bowel movement in the first 3-4 days after surgery call either your surgeon or your primary doctor.    If you experience loose stools or diarrhea, hold the medications until you stool forms back up.  If your symptoms do not get better within 1 week or if they get  worse, check with your doctor.  If you experience "the worst abdominal pain ever" or develop nausea or vomiting, please contact the office immediately for further recommendations for treatment.   ITCHING:  If you experience itching with your medications, try taking only a single pain pill, or even half a pain pill at a time.  You can also use Benadryl over the counter for itching or also to help with sleep.   TED HOSE STOCKINGS:  Use stockings on both legs until for at least 2 weeks or as directed by physician office. They may be removed at night for sleeping.  MEDICATIONS:  See your medication summary on the "After Visit Summary" that nursing will review with you.  You may have some home medications which will be placed on hold until you complete the course of blood thinner medication.  It is important for you to complete the blood thinner medication as prescribed.  PRECAUTIONS:  If you experience chest pain or  shortness of breath - call 911 immediately for transfer to the hospital emergency department.   If you develop a fever greater that 101 F, purulent drainage from wound, increased redness or drainage from wound, foul odor from the wound/dressing, or calf pain - CONTACT YOUR SURGEON.                                                   FOLLOW-UP APPOINTMENTS:  If you do not already have a post-op appointment, please call the office for an appointment to be seen by your surgeon.  Guidelines for how soon to be seen are listed in your "After Visit Summary", but are typically between 1-4 weeks after surgery.  OTHER INSTRUCTIONS:   Knee Replacement:  Do not place pillow under knee, focus on keeping the knee straight while resting. CPM instructions: 0-90 degrees, 2 hours in the morning, 2 hours in the afternoon, and 2 hours in the evening. Place foam block, curve side up under heel at all times except when in CPM or when walking.  DO NOT modify, tear, cut, or change the foam block in any way.  MAKE SURE YOU:  Understand these instructions.  Get help right away if you are not doing well or get worse.    Thank you for letting us be a part of your medical care team.  It is a privilege we respect greatly.  We hope these instructions will help you stay on track for a fast and full recovery!   Increase activity slowly as tolerated   Complete by: As directed       Follow-up Information    Melrose Nakayama, MD. Go on 06/18/2019.   Specialty: Orthopedic Surgery Why: Your appointment has been scheduled for 9:45.  Contact information: [place] [address] [phone]        [facility] Follow up.   Why: You will be seen by HHPT for 5 visits prior to starting outpatient physical therapy  Contact information: [place] [id number] [facility] [place]. Go on 06/17/2019.   Why: You are scheduled to start outpatient physical  therapy at 10:30.  Contact information: [address] [place], [place]           Signed: Larwance Sachs Ilea Hilton  06/09/2019, 7:57 AM

## 2019-06-09 NOTE — Progress Notes (Signed)
Subjective: 1 Day Post-Op Procedure(s) (LRB): RIGHT TOTAL KNEE ARTHROPLASTY (Right)   Patient feels great with no pain. He is looking forward to going home today.  Activity level:  wbat Diet tolerance:  ok Voiding:  ok Patient reports pain as mild.    Objective: Vital signs in last 24 hours: Temp:  [96.6 F (35.9 C)-98.4 F (36.9 C)] 98.2 F (36.8 C) (11/25 0450) Pulse Rate:  [65-91] 65 (11/25 0450) Resp:  [12-19] 18 (11/25 0450) BP: (110-152)/(61-89) 144/70 (11/25 0450) SpO2:  [93 %-100 %] 96 % (11/25 0450) Weight:  [79.1 kg] 79.1 kg (11/24 1223)  Labs: No results for input(s): HGB in the last 72 hours. No results for input(s): WBC, RBC, HCT, PLT in the last 72 hours. No results for input(s): NA, K, CL, CO2, BUN, CREATININE, GLUCOSE, CALCIUM in the last 72 hours. No results for input(s): LABPT, INR in the last 72 hours.  Physical Exam:  Neurologically intact ABD soft Neurovascular intact Sensation intact distally Intact pulses distally Dorsiflexion/Plantar flexion intact Incision: dressing C/D/I and no drainage No cellulitis present Compartment soft  Assessment/Plan:  1 Day Post-Op Procedure(s) (LRB): RIGHT TOTAL KNEE ARTHROPLASTY (Right) Advance diet Up with therapy D/C IV fluids Discharge home with home health today after PT. Continue on 81mg  ASA BID x 2 weeks post op. Follow up in office 2 weeks post op.   Nicholas Wise 06/09/2019, 7:53 AM

## 2019-06-09 NOTE — Progress Notes (Signed)
Physical Therapy Treatment Patient Details Name: Nicholas Wise MRN: [id number] DOB: [birth year] Today's Date: 06/09/2019    History of Present Illness Patient is 73 y.o male s/p  Rt TKA with PMH significant for TIA, OA, and ACDF C3-6, Lumbar fusion L4-5.    PT Comments    Pt performed gait training and practiced one step.  Will return to review exercises and ambulate again prior to d/c home today.  Follow Up Recommendations  Follow surgeon's recommendation for DC plan and follow-up therapies     Equipment Recommendations  None recommended by PT    Recommendations for Other Services       Precautions / Restrictions Precautions Precautions: Fall;Knee Restrictions Weight Bearing Restrictions: No RLE Weight Bearing: Weight bearing as tolerated    Mobility  Bed Mobility               General bed mobility comments: pt in recliner on arrival  Transfers Overall transfer level: Needs assistance Equipment used: Rolling walker (2 wheeled) Transfers: Sit to/from Stand Sit to Stand: Min guard         General transfer comment: verbal cues for UE and LE positioning  Ambulation/Gait Ambulation/Gait assistance: Min guard Gait Distance (Feet): 240 Feet Assistive device: Rolling walker (2 wheeled) Gait Pattern/deviations: Step-through pattern;Decreased stance time - right;Decreased stride length     General Gait Details: verbal cues for RW positioning, posture, right knee flexion in swing, heel strike   Stairs Stairs: Yes Stairs assistance: Min guard Stair Management: Step to pattern;Forwards;With walker Number of Stairs: 1 General stair comments: verbal cues for sequence, RW positioning, safety; pt performed twice and reports understanding   Wheelchair Mobility    Modified Rankin (Stroke Patients Only)       Balance                                            Cognition Arousal/Alertness: Awake/alert Behavior During Therapy: WFL for  tasks assessed/performed Overall Cognitive Status: Within Functional Limits for tasks assessed                                        Exercises      General Comments        Pertinent Vitals/Pain Pain Assessment: No/denies pain Pain Intervention(s): Repositioned;Monitored during session;Ice applied    Home Living                      Prior Function            PT Goals (current goals can now be found in the care plan section) Progress towards PT goals: Progressing toward goals    Frequency    7X/week      PT Plan Current plan remains appropriate    Co-evaluation              AM-PAC PT "6 Clicks" Mobility   Outcome Measure  Help needed turning from your back to your side while in a flat bed without using bedrails?: A Little Help needed moving from lying on your back to sitting on the side of a flat bed without using bedrails?: A Little Help needed moving to and from a bed to a chair (including a wheelchair)?: A Little Help needed standing up from a chair using  your arms (e.g., wheelchair or bedside chair)?: A Little Help needed to walk in hospital room?: A Little Help needed climbing 3-5 steps with a railing? : A Little 6 Click Score: 18    End of Session Equipment Utilized During Treatment: Gait belt Activity Tolerance: Patient tolerated treatment well Patient left: in chair;with call bell/phone within reach;with chair alarm set Nurse Communication: Mobility status PT Visit Diagnosis: Muscle weakness (generalized) (M62.81);Difficulty in walking, not elsewhere classified (R26.2)     Time: [id number] PT Time Calculation (min) (ACUTE ONLY): 10 min  Charges:  $Gait Training: 8-22 mins                    Carmelia Bake, PT, DPT Acute Rehabilitation Services Office: [phone] Pager: [phone]  Trena Platt 06/09/2019, 11:05 AM

## 2019-06-09 NOTE — Plan of Care (Signed)
Patient discharged home in stable condition 

## 2019-06-10 NOTE — Anesthesia Postprocedure Evaluation (Signed)
Anesthesia Post Note  Patient: Nicholas Wise  Procedure(s) Performed: RIGHT TOTAL KNEE ARTHROPLASTY (Right Knee)     Patient location during evaluation: PACU Anesthesia Type: General Level of consciousness: sedated and patient cooperative Pain management: pain level controlled Vital Signs Assessment: post-procedure vital signs reviewed and stable Respiratory status: spontaneous breathing Cardiovascular status: stable Anesthetic complications: no    Last Vitals:  Vitals:   06/09/19 0450 06/09/19 1026  BP: (!) 144/70 135/64  Pulse: 65 66  Resp: 18 20  Temp: 36.8 C 36.6 C  SpO2: 96% 98%    Last Pain:  Vitals:   06/09/19 1026  TempSrc: Oral  PainSc:                  Nolon Nations

## 2020-01-05 NOTE — Progress Notes (Signed)
Cardiology Office Note   Date:  01/07/2020   ID:  Nicholas Wise, DOB 1945/09/24, MRN [id number]  PCP:  Nicholas Kaufmann, MD    No chief complaint on file.  Aortic stenosis  Wt Readings from Last 3 Encounters:  01/07/20 172 lb 12.8 oz (78.4 kg)  06/08/19 174 lb 6.1 oz (79.1 kg)  06/04/19 174 lb 4.8 oz (79.1 kg)       History of Present Illness: Nicholas Wise is a 74 y.o. male  who I saw for a murmur in 2019.  TIA in 2015. On Plavix since then.   Echo in 2019 showed: Left ventricle: The cavity size was normal. Wall thickness was normal. Systolic function was normal. The estimated ejection fraction was in the range of 60% to 65%. Wall motion was normal; there were no regional wall motion abnormalities. Doppler parameters are consistent with abnormal left ventricular relaxation (grade 1 diastolic dysfunction). - Aortic valve: Trileaflet; severely thickened, severely calcified leaflets. Valve mobility was restricted. There was moderate stenosis. Peak velocity (S): 312 cm/s. Mean gradient (S): 22 mm Hg. Peak gradient (S): 39 mm Hg. Valve area (Vmax): 1.24 cm^2.  He had his back surgery in 8/19.  This was successful.  In 2020, He went back to work.  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  He gets some walking with this job.  3.5 miles/day.  In November 2020, he had knee replacement.  He did well with this.  No cardiac issues at the time.  Denies : Chest pain. Dizziness. Leg edema. Nitroglycerin use. Orthopnea. Palpitations. Paroxysmal nocturnal dyspnea. Shortness of breath. Syncope.   He walks several miles a day.  He is doing more with the new knee.  He is working a lot around [employer].  He retired in July 2020 form driving.   He never got COVID, or the vaccines.    Past Medical History:  Diagnosis Date  . Anxiety   . Aortic stenosis   . Arthritis   . Depression   . Hemorrhoids   . Hiatal hernia   . High cholesterol   . History of  kidney stones   . Hx of adenomatous polyp of colon 01/31/2017  . Numbness and tingling in left hand   . PONV (postoperative nausea and vomiting)    bladder spasms went home after neck surgery w/ catheter  . Stroke ([place]) 11/09/2013   Tia   . TIA (transient ischemic attack)   . Urinary frequency     Past Surgical History:  Procedure Laterality Date  . ANTERIOR CERVICAL DECOMP/DISCECTOMY FUSION N/A 04/11/2014   Procedure: ANTERIOR CERVICAL DECOMPRESSION/DISCECTOMY FUSION CERVICAL THREE-FOUR,CERVICAL FOUR-FIVE,CERVICAL FIVE-SIX;  Surgeon: Nicholas Levine, MD;  Location: [facility];  Service: Neurosurgery;  Laterality: N/A;  . ANTERIOR LAT LUMBAR FUSION Left 02/20/2018   Procedure: Left Lumbar Four-Five Anterolateral lumbar interbody fusion;  Surgeon: Nicholas Levine, MD;  Location: [facility];  Service: Neurosurgery;  Laterality: Left;  . BACK SURGERY    . COLONOSCOPY    . KNEE SURGERY Right   . LUMBAR PERCUTANEOUS PEDICLE SCREW 1 LEVEL N/A 02/20/2018   Procedure: Lumbar Percutaneous Pedicle Screw Placment Lumbar four-five;  Surgeon: Nicholas Levine, MD;  Location: [facility];  Service: Neurosurgery;  Laterality: N/A;  . TONSILLECTOMY    . TOTAL KNEE ARTHROPLASTY Right 06/08/2019   Procedure: RIGHT TOTAL KNEE ARTHROPLASTY;  Surgeon: Nicholas Nakayama, MD;  Location: [facility];  Service: Orthopedics;  Laterality: Right;     Current  Outpatient Medications  Medication Sig Dispense Refill  . acetaminophen (TYLENOL) 500 MG tablet Take 500 mg by mouth as needed for mild pain or headache.     . allopurinol (ZYLOPRIM) 300 MG tablet Take 300 mg by mouth daily.    Marland Kitchen atorvastatin (LIPITOR) 40 MG tablet Take 40 mg by mouth every evening.     . clopidogrel (PLAVIX) 75 MG tablet Take 1 tablet (75 mg total) by mouth daily.    . finasteride (PROSCAR) 5 MG tablet Take 5 mg by mouth 2 (two) times daily.     Vladimir Faster Glycol-Propyl Glycol (SYSTANE OP) Place 1 drop into both eyes daily.     . potassium citrate (UROCIT-K) 10 MEQ  (1080 MG) SR tablet Take 10 mEq by mouth 2 (two) times daily.    . sertraline (ZOLOFT) 100 MG tablet Take 100 mg by mouth at bedtime.    . tamsulosin (FLOMAX) 0.4 MG CAPS capsule Take 0.4 mg by mouth 2 (two) times daily.    Marland Kitchen triamcinolone (NASACORT) 55 MCG/ACT AERO nasal inhaler Place 2 sprays into the nose daily.      No current facility-administered medications for this visit.    Allergies:   Patient has no known allergies.    Social History:  The patient  reports that he has never smoked. He has never used smokeless tobacco. He reports current alcohol use. He reports that he does not use drugs.   Family History:  The patient's family history includes Diabetes in his father; Heart disease in his father.    ROS:  Please see the history of present illness.   Otherwise, review of systems are positive for intentionally keeping weight down with portion control.   All other systems are reviewed and negative.    PHYSICAL EXAM: VS:  BP 134/68   Pulse 66   Ht 5\' 9"  (1.753 m)   Wt 172 lb 12.8 oz (78.4 kg)   SpO2 97%   BMI 25.52 kg/m  , BMI Body mass index is 25.52 kg/m. GEN: Well nourished, well developed, in no acute distress  HEENT: normal  Neck: no JVD, carotid bruits, or masses Cardiac: RRR;3/6 harsh murmur, no rubs, or gallops,no edema  Respiratory:  clear to auscultation bilaterally, normal work of breathing GI: soft, nontender, nondistended, + BS MS: no deformity or atrophy  Skin: warm and dry, no rash Neuro:  Strength and sensation are intact Psych: euthymic mood, full affect   EKG:   The ekg ordered 05/2019 demonstrates NSR, RBBB   Recent Labs: 06/04/2019: BUN 13; Creatinine, Ser 0.61; Hemoglobin 13.8; Platelets 177; Potassium 4.4; Sodium 137   Lipid Panel No results found for: CHOL, TRIG, HDL, CHOLHDL, VLDL, LDLCALC, LDLDIRECT   Other studies Reviewed: Additional studies/ records that were reviewed today with results demonstrating: PSA 2.68 December 16, 2019- stable.   LDL 95 in 4/21.   ASSESSMENT AND PLAN:  1. Aortic stenosis: Moderate by prior echo.  No sx of severe AS.  Watch for syncope, dizziness, chest pain and CHF.  Still maintaining his lawn, 2 acres.  Repeat echo in 2022. 2. Hyperlipidemia: Continue atorvastatin.  Whole food , plant based diet.  High fiber diet being followed.  Chicken and [place] consumed.  Avoids fried foods. 3. Prior TIA: On clopidogrel for several years.  Watch for any signs of bleeding.  No bleeding at this time.    Current medicines are reviewed at length with the patient today.  The patient concerns regarding his medicines were addressed.  The following changes have been made:  No change  Labs/ tests ordered today include:  No orders of the defined types were placed in this encounter.   Recommend 150 minutes/week of aerobic exercise Low fat, low carb, high fiber diet recommended  Disposition:   FU in 1 year   Signed, Larae Grooms, MD  01/07/2020 9:56 AM    [facility] [place], [place], [place]  [address] Phone: [phone]; Fax: [phone]

## 2020-01-07 ENCOUNTER — Ambulatory Visit
Admission: RE | Admit: 2020-01-07 | Discharge: 2020-01-07 | Disposition: A | Discharge status: Home / Self Care | Payer: Medicare Other | Source: Ambulatory Visit | Attending: Family Medicine | Admitting: Family Medicine

## 2020-01-07 ENCOUNTER — Other Ambulatory Visit: Payer: Self-pay

## 2020-01-07 ENCOUNTER — Encounter: Payer: Self-pay | Admitting: Interventional Cardiology

## 2020-01-07 ENCOUNTER — Other Ambulatory Visit: Payer: Self-pay | Admitting: Family Medicine

## 2020-01-07 ENCOUNTER — Ambulatory Visit (INDEPENDENT_AMBULATORY_CARE_PROVIDER_SITE_OTHER): Payer: Medicare Other | Admitting: Interventional Cardiology

## 2020-01-07 VITALS — BP 134/68 | HR 66 | Ht 69.0 in | Wt 172.8 lb

## 2020-01-07 DIAGNOSIS — Z8673 Personal history of transient ischemic attack (TIA), and cerebral infarction without residual deficits: Secondary | ICD-10-CM | POA: Diagnosis not present

## 2020-01-07 DIAGNOSIS — I350 Nonrheumatic aortic (valve) stenosis: Secondary | ICD-10-CM

## 2020-01-07 DIAGNOSIS — N200 Calculus of kidney: Secondary | ICD-10-CM

## 2020-01-07 DIAGNOSIS — E782 Mixed hyperlipidemia: Secondary | ICD-10-CM | POA: Diagnosis not present

## 2020-01-07 NOTE — Patient Instructions (Signed)
Medication Instructions:  Your physician recommends that you continue on your current medications as directed. Please refer to the Current Medication list given to you today.  *If you need a refill on your cardiac medications before your next appointment, please call your pharmacy*   Lab Work: None  If you have labs (blood work) drawn today and your tests are completely normal, you will receive your results only by:  [place] (if you have MyChart) OR  A paper copy in the mail If you have any lab test that is abnormal or we need to change your treatment, we will call you to review the results.   Testing/Procedures: Your physician has requested that you have an echocardiogram in 1 year. Echocardiography is a painless test that uses sound waves to create images of your heart. It provides your doctor with information about the size and shape of your heart and how well your hearts chambers and valves are working. This procedure takes approximately one hour. There are no restrictions for this procedure.     Follow-Up: At [facility], you and your health needs are our priority.  As part of our continuing mission to provide you with exceptional heart care, we have created designated Provider Care Teams.  These Care Teams include your primary Cardiologist (physician) and Advanced Practice Providers (APPs -  Physician Assistants and Nurse Practitioners) who all work together to provide you with the care you need, when you need it.  We recommend signing up for the patient portal called "MyChart".  Sign up information is provided on this After Visit Summary.  MyChart is used to connect with patients for Virtual Visits (Telemedicine).  Patients are able to view lab/test results, encounter notes, upcoming appointments, etc.  Non-urgent messages can be sent to your provider as well.   To learn more about what you can do with MyChart, go to [email or web address].    Your next appointment:    12 month(s)  The format for your next appointment:   In Person  Provider:   You may see Larae Grooms, MD or one of the following Advanced Practice Providers on your designated Care Team:    Melina Copa, PA-C  Ermalinda Barrios, PA-C    Other Instructions  High-Fiber Diet Fiber, also called dietary fiber, is a type of carbohydrate that is found in fruits, vegetables, whole grains, and beans. A high-fiber diet can have many health benefits. Your health care provider may recommend a high-fiber diet to help:  Prevent constipation. Fiber can make your bowel movements more regular.  Lower your cholesterol.  Relieve the following conditions: ? Swelling of veins in the anus (hemorrhoids). ? Swelling and irritation (inflammation) of specific areas of the digestive tract (uncomplicated diverticulosis). ? A problem of the large intestine (colon) that sometimes causes pain and diarrhea (irritable bowel syndrome, IBS).  Prevent overeating as part of a weight-loss plan.  Prevent heart disease, type 2 diabetes, and certain cancers. What is my plan? The recommended daily fiber intake in grams (g) includes:  38 g for men age 35 or younger.  30 g for men over age 44.  41 g for women age 76 or younger.  21 g for women over age 60. You can get the recommended daily intake of dietary fiber by:  Eating a variety of fruits, vegetables, grains, and beans.  Taking a fiber supplement, if it is not possible to get enough fiber through your diet. What do I need to know about a high-fiber diet?  It is better to get fiber through food sources rather than from fiber supplements. There is not a lot of research about how effective supplements are.  Always check the fiber content on the nutrition facts label of any prepackaged food. Look for foods that contain 5 g of fiber or more per serving.  Talk with a diet and nutrition specialist (dietitian) if you have questions about specific foods that  are recommended or not recommended for your medical condition, especially if those foods are not listed below.  Gradually increase how much fiber you consume. If you increase your intake of dietary fiber too quickly, you may have bloating, cramping, or gas.  Drink plenty of water. Water helps you to digest fiber. What are tips for following this plan?  Eat a wide variety of high-fiber foods.  Make sure that half of the grains that you eat each day are whole grains.  Eat breads and cereals that are made with whole-grain flour instead of refined flour or white flour.  Eat brown rice, bulgur wheat, or millet instead of white rice.  Start the day with a breakfast that is high in fiber, such as a cereal that contains 5 g of fiber or more per serving.  Use beans in place of meat in soups, salads, and pasta dishes.  Eat high-fiber snacks, such as berries, raw vegetables, nuts, and popcorn.  Choose whole fruits and vegetables instead of processed forms like juice or sauce. What foods can I eat?  Fruits Berries. Pears. Apples. Oranges. Avocado. Prunes and raisins. Dried figs. Vegetables Sweet potatoes. Spinach. Kale. Artichokes. Cabbage. Broccoli. Cauliflower. Green peas. Carrots. Squash. Grains Whole-grain breads. Multigrain cereal. Oats and oatmeal. Brown rice. Barley. Bulgur wheat. Johnson. Quinoa. Bran muffins. Popcorn. Rye wafer crackers. Meats and other proteins Navy, kidney, and pinto beans. Soybeans. Split peas. Lentils. Nuts and seeds. Dairy Fiber-fortified yogurt. Beverages Fiber-fortified soy milk. Fiber-fortified orange juice. Other foods Fiber bars. The items listed above may not be a complete list of recommended foods and beverages. Contact a dietitian for more options. What foods are not recommended? Fruits Fruit juice. Cooked, strained fruit. Vegetables Fried potatoes. Canned vegetables. Well-cooked vegetables. Grains White bread. Pasta made with refined flour.  White rice. Meats and other proteins Fatty cuts of meat. Fried chicken or fried fish. Dairy Milk. Yogurt. Cream cheese. Sour cream. Fats and oils Butters. Beverages Soft drinks. Other foods Cakes and pastries. The items listed above may not be a complete list of foods and beverages to avoid. Contact a dietitian for more information. Summary  Fiber is a type of carbohydrate. It is found in fruits, vegetables, whole grains, and beans.  There are many health benefits of eating a high-fiber diet, such as preventing constipation, lowering blood cholesterol, helping with weight loss, and reducing your risk of heart disease, diabetes, and certain cancers.  Gradually increase your intake of fiber. Increasing too fast can result in cramping, bloating, and gas. Drink plenty of water while you increase your fiber.  The best sources of fiber include whole fruits and vegetables, whole grains, nuts, seeds, and beans. This information is not intended to replace advice given to you by your health care provider. Make sure you discuss any questions you have with your health care provider. Document Revised: 05/05/2017 Document Reviewed: 05/05/2017 Elsevier Patient Education  2020 [employer].

## 2020-07-18 ENCOUNTER — Ambulatory Visit: Payer: Self-pay | Admitting: General Surgery

## 2020-07-18 ENCOUNTER — Telehealth: Payer: Self-pay | Admitting: *Deleted

## 2020-07-18 NOTE — Telephone Encounter (Signed)
   [facility] Pre-operative Risk Assessment    HEARTCARE STAFF: - Please ensure there is not already an duplicate clearance open for this procedure. - Under Visit Info/Reason for Call, type in Other and utilize the format Clearance MM/DD/YY or Clearance TBD. Do not use dashes or single digits. - If request is for dental extraction, please clarify the # of teeth to be extracted.  Request for surgical clearance:  1. What type of surgery is being performed? ROBOTIC B/L INGUINAL HERNIA REPAIR w/MESH   2. When is this surgery scheduled? TBD   3. What type of clearance is required (medical clearance vs. Pharmacy clearance to hold med vs. Both)? MEDICAL  4. Are there any medications that need to be held prior to surgery and how long? PLAVIX   5. Practice name and name of physician performing surgery? [facility]; MD IS NOT LISTED   6. What is the office phone number? [phone]   7.   What is the office fax number? [phone]  8.   Anesthesia type (None, local, MAC, general) ? GENERAL   Nicholas Wise 07/18/2020, 4:43 PM  _________________________________________________________________   (provider comments below)

## 2020-07-18 NOTE — H&P (Unsigned)
History of Present Illness Nicholas Ok MD; 07/18/2020 3:11 PM) The patient is a 75 year old male who presents with an inguinal hernia. Patient is a 75 year old male who comes in secondary to referral for minimally invasive bilateral hernia repair. As per below the patient's had 6 -8 month history of bilateral hernias, right greater than left. He states he has some mild discomfort and pain as well as burning sensation. Patient states he is active outside 50 some lifting gardening.  Patient does have a history of ear stenosis as well as TIA. Currently on Plavix. ------------------  CHIEF COMPLAINT: right inguinal hernia  Patient is referred by Dellia Nims, NP, and Dr. Pat Kocher, in [place], [place], for surgical evaluation and management of right inguinal hernia. Patient first noted a bulge in the right groin approximately 6 months ago. This has gradually enlarged. He is noted intermittent mild burning. He has had no signs or symptoms of intestinal obstruction. He has had no prior abdominal surgery. He has had no prior hernia repairs. From a medical standpoint, the patient does have a history of TIA and takes Plavix. He is also followed by Dr. Casandra Doffing from cardiology for aortic stenosis. Patient denies any signs or symptoms of intestinal obstruction. He presents today for evaluation for hernia repair.   Allergies Mallie Snooks, CMA; 07/18/2020 2:51 PM) No Known Drug Allergies  [06/20/2020]: Allergies Reconciled   Medication History Mallie Snooks, [place]; 07/18/2020 2:52 PM) Clopidogrel Bisulfate (75MG  Tablet, Oral) Active. Sertraline HCl (100MG  Tablet, Oral) Active. Tylenol Extra Strength (500MG  Tablet, Oral) Active. Allopurinol (300MG  Tablet, Oral) Active. Atorvastatin Calcium (40MG  Tablet, Oral) Active. Finasteride (5MG  Tablet, Oral) Active. Tamsulosin HCl (0.4MG  Capsule, Oral) Active. Potassium Citrate ER (10 MEQ(1080 MG) Tablet ER, Oral)  Active. Vitamin D (1 (one) Oral) Specific strength unknown - Active. Medications Reconciled    Review of Systems Nicholas Ok, MD; 07/18/2020 3:13 PM) General Not Present- Appetite Loss, Chills, Fatigue, Fever, Night Sweats, Weight Gain and Weight Loss. Skin Not Present- Change in Wart/Mole, Dryness, Hives, Jaundice, New Lesions, Non-Healing Wounds, Rash and Ulcer. HEENT Present- Ringing in the Ears, Seasonal Allergies and Wears glasses/contact lenses. Not Present- Earache, Hearing Loss, Hoarseness, Nose Bleed, Oral Ulcers, Sinus Pain, Sore Throat, Visual Disturbances and Yellow Eyes. Respiratory Present- Snoring. Not Present- Bloody sputum, Chronic Cough, Difficulty Breathing and Wheezing. Breast Not Present- Breast Mass, Breast Pain, Nipple Discharge and Skin Changes. Cardiovascular Not Present- Chest Pain, Difficulty Breathing Lying Down, Leg Cramps, Palpitations, Rapid Heart Rate, Shortness of Breath and Swelling of Extremities. Gastrointestinal Not Present- Abdominal Pain, Bloating, Bloody Stool, Change in Bowel Habits, Chronic diarrhea, Constipation, Difficulty Swallowing, Excessive gas, Gets full quickly at meals, Hemorrhoids, Indigestion, Nausea, Rectal Pain and Vomiting. Male Genitourinary Not Present- Blood in Urine, Change in Urinary Stream, Frequency, Impotence, Nocturia, Painful Urination, Urgency and Urine Leakage. Musculoskeletal Present- Joint Pain. Not Present- Back Pain, Joint Stiffness, Muscle Pain, Muscle Weakness and Swelling of Extremities. Neurological Not Present- Decreased Memory, Fainting, Headaches, Numbness, Seizures, Tingling, Tremor, Trouble walking and Weakness. Psychiatric Present- Anxiety. Not Present- Bipolar, Change in Sleep Pattern, Depression, Fearful and Frequent crying. Endocrine Not Present- Cold Intolerance, Excessive Hunger, Hair Changes, Heat Intolerance and New Diabetes. Hematology Present- Blood Thinners and Easy Bruising. Not Present- Excessive  bleeding, Gland problems, HIV and Persistent Infections.  Vitals Mallie Snooks CMA; 07/18/2020 2:52 PM) 07/18/2020 2:52 PM Weight: 174.38 lb Height: 69in Body Surface Area: 1.95 m Body Mass Index: 25.75 kg/m  Temp.: 97.61F  Pulse: 95 (Regular)  P.OX: 97% (  Room air) BP: 140/80(Sitting, Left Arm, Standard)       Physical Exam Axel Filler MD; 07/18/2020 3:12 PM) The physical exam findings are as follows: Note: Constitutional: No acute distress, conversant, appears stated age  Eyes: Anicteric sclerae, moist conjunctiva, no lid lag  Neck: No thyromegaly, trachea midline, no cervical lymphadenopathy  Lungs: Clear to auscultation biilaterally, normal respiratory effot  Cardiovascular: regular rate & rhythm, no murmurs, no peripheal edema, pedal pulses 2+  GI: Soft, no masses or hepatosplenomegaly, non-tender to palpation, bilateral inguinal hernias, right greater than left.  MSK: Normal gait, no clubbing cyanosis, edema  Skin: No rashes, palpation reveals normal skin turgor  Psychiatric: Appropriate judgment and insight, oriented to person, place, and time    Assessment & Plan Axel Filler MD; 07/18/2020 3:13 PM) BILATERAL DIRECT INGUINAL HERNIA (K40.20) Impression: 75 year old male with a history of TIA, aortic stenosis, on Plavix, with bilateral inguinal hernias. We'll obtain clearance to be off his Plavix as well as cardiac clearance prior to scheduling surgery. 1. The patient will like to proceed to the operating room for robotic bilateral inguinal hernia repair with mesh.  2. I discussed with the patient the signs and symptoms of incarceration and strangulation and the need to proceed to the ER should they occur.  3. I discussed with the patient the risks and benefits of the procedure to include but not limited to: Infection, bleeding, damage to surrounding structures, possible need for further surgery, possible nerve pain, and possible recurrence. The  patient was understanding and wishes to proceed.

## 2020-07-18 NOTE — Telephone Encounter (Signed)
I will fax clearance notes to requesting office, with the recommendations to contact MD who prescribed Plavix as pt was placed on Plavix for a TIA.

## 2020-07-18 NOTE — Telephone Encounter (Signed)
Could you please let the procedural team know that the patient is on Plavix for a hx of TIA therefore holding recommendations should come from Plavix prescriber.   From a CV standpoint, he was last seen by Dr. Eldridge Dace 12/2019 at which time he was doing well and was very active at baseline with no CV symptoms. He has had several recent surgeries without complications.   Georgie Chard NP-C [facility] Pager: [phone]

## 2020-10-13 NOTE — Addendum Note (Signed)
Addended by: Willeen Cass A on: 10/13/2020 11:19 AM   Modules accepted: Orders

## 2020-11-13 ENCOUNTER — Other Ambulatory Visit (HOSPITAL_COMMUNITY): Payer: Medicare Other

## 2020-11-16 ENCOUNTER — Ambulatory Visit: Payer: Self-pay | Admitting: General Surgery

## 2020-11-17 ENCOUNTER — Encounter (HOSPITAL_COMMUNITY)
Admission: RE | Admit: 2020-11-17 | Discharge: 2020-11-17 | Disposition: A | Discharge status: Home / Self Care | Payer: Medicare Other | Source: Ambulatory Visit | Attending: General Surgery | Admitting: General Surgery

## 2020-11-17 ENCOUNTER — Other Ambulatory Visit (HOSPITAL_COMMUNITY)
Admission: RE | Admit: 2020-11-17 | Discharge: 2020-11-17 | Disposition: A | Discharge status: Home / Self Care | Payer: Medicare Other | Source: Ambulatory Visit | Attending: General Surgery | Admitting: General Surgery

## 2020-11-17 ENCOUNTER — Other Ambulatory Visit: Payer: Self-pay

## 2020-11-17 ENCOUNTER — Encounter (HOSPITAL_COMMUNITY): Payer: Self-pay

## 2020-11-17 DIAGNOSIS — I4510 Unspecified right bundle-branch block: Secondary | ICD-10-CM | POA: Diagnosis not present

## 2020-11-17 DIAGNOSIS — Z01812 Encounter for preprocedural laboratory examination: Secondary | ICD-10-CM | POA: Insufficient documentation

## 2020-11-17 DIAGNOSIS — Z01818 Encounter for other preprocedural examination: Secondary | ICD-10-CM | POA: Diagnosis present

## 2020-11-17 DIAGNOSIS — Z20822 Contact with and (suspected) exposure to covid-19: Secondary | ICD-10-CM | POA: Insufficient documentation

## 2020-11-17 LAB — CBC
HCT: 40.4 % (ref 39.0–52.0)
Hemoglobin: 13.2 g/dL (ref 13.0–17.0)
MCH: 33.1 pg (ref 26.0–34.0)
MCHC: 32.7 g/dL (ref 30.0–36.0)
MCV: 101.3 fL — ABNORMAL HIGH (ref 80.0–100.0)
Platelets: 168 10*3/uL (ref 150–400)
RBC: 3.99 MIL/uL — ABNORMAL LOW (ref 4.22–5.81)
RDW: 13.3 % (ref 11.5–15.5)
WBC: 4.5 10*3/uL (ref 4.0–10.5)
nRBC: 0 % (ref 0.0–0.2)

## 2020-11-17 LAB — BASIC METABOLIC PANEL
Anion gap: 6 (ref 5–15)
BUN: 18 mg/dL (ref 8–23)
CO2: 30 mmol/L (ref 22–32)
Calcium: 9.3 mg/dL (ref 8.9–10.3)
Chloride: 100 mmol/L (ref 98–111)
Creatinine, Ser: 0.67 mg/dL (ref 0.61–1.24)
GFR, Estimated: >60 mL/min (ref >60)
Glucose, Bld: 103 mg/dL — ABNORMAL HIGH (ref 70–99)
Potassium: 4.4 mmol/L (ref 3.5–5.1)
Sodium: 136 mmol/L (ref 135–145)

## 2020-11-17 LAB — SARS CORONAVIRUS 2 (TAT 6-24 HRS): SARS Coronavirus 2: NEGATIVE

## 2020-11-17 NOTE — Progress Notes (Signed)
PCP - Dr Pat Kocher Cardiologist - Dr Irish Lack Pt given cardiac clearance in January, per note Nicholas Caldwell, PA aware  Chest x-ray - N/A EKG - 11/17/20 Stress Test - none ECHO - 01/05/18 Cardiac Cath - none   Blood Thinner Instructions: Plavix last dose on 11/15/20 per pt.  ERAS Protcol - yes, Ensure given  COVID TEST- 11/17/2020   Anesthesia review:  Yes, heart history & EKG Nicholas Wise notified  Patient denies shortness of breath, fever, cough and chest pain at PAT appointment   All instructions explained to the patient, with a verbal understanding of the material. Patient agrees to go over the instructions while at home for a better understanding. Patient also instructed to self quarantine after being tested for COVID-19. The opportunity to ask questions was provided.

## 2020-11-17 NOTE — Progress Notes (Signed)
Surgical Instructions    Your procedure is scheduled on Tuesday, 11/21/2020.  Report to [facility] Main Entrance "A" at 8:30 A.M., then check in with the Admitting office.  Call this number if you have problems the morning of surgery:  [phone]   If you have any questions prior to your surgery date call [phone]: Open Monday-Friday 8am-4pm    Remember:  Do not eat after midnight the night before your surgery  You may drink clear liquids until 7:30 am the morning of your surgery.   Clear liquids allowed are: Water, Non-Citrus Juices (without pulp), Carbonated Beverages, Clear Tea, Black Coffee Only, and Gatorade  Please complete your PRE-SURGERY ENSURE that was provided to you by 7:30 AM the morning of surgery.  Please, if able, drink it in one setting. DO NOT SIP.  Nothing else to drink after you finish the Ensure.     Take these medicines the morning of surgery with A SIP OF WATER               Acetamenohen (Tylenol) if needed              Allopurinol ( Zyloprim)              Alprazolam (Xanax) if needed              Finasteride (Proscar)              Triamsinolone (Nasacort) if needed  Follow your surgeon's instructions on when to stop Clopidogrel (Plavix).  If no instructions were given by your surgeon then you will need to call the office to get those instructions.    As of today, STOP taking any Aspirin (unless otherwise instructed by your surgeon) Aleve, Naproxen, Ibuprofen, Motrin, Advil, Goody's, BC's, all herbal medications, fish oil, and all vitamins.                     Do not wear jewelry, make up, or nail polish            Do not wear lotions, powders, colognes, or deodorant.            Men may shave face and neck.            Do not bring valuables to the hospital.            [facility] is not responsible for any belongings or valuables.  Do NOT Smoke (Tobacco/Vaping) or drink Alcohol 24 hours prior to your procedure If you use a CPAP at night, you may bring  all equipment for your overnight stay.   Contacts, glasses, dentures or bridgework may not be worn into surgery, please bring cases for these belongings   For patients admitted to the hospital, discharge time will be determined by your treatment team.   Patients discharged the day of surgery will not be allowed to drive home, and someone needs to stay with them for 24 hours.    Special instructions:   [facility]- Preparing For Surgery  Before surgery, you can play an important role. Because skin is not sterile, your skin needs to be as free of germs as possible. You can reduce the number of germs on your skin by washing with CHG (chlorahexidine gluconate) Soap before surgery.  CHG is an antiseptic cleaner which kills germs and bonds with the skin to continue killing germs even after washing.    Oral Hygiene is also important to reduce your risk of infection.  Remember - BRUSH YOUR TEETH THE MORNING OF SURGERY WITH YOUR REGULAR TOOTHPASTE  Please do not use if you have an allergy to CHG or antibacterial soaps. If your skin becomes reddened/irritated stop using the CHG.  Do not shave (including legs and underarms) for at least 48 hours prior to first CHG shower. It is OK to shave your face.  Please follow these instructions carefully.   1. Shower the NIGHT BEFORE SURGERY and the MORNING OF SURGERY  2. If you chose to wash your hair, wash your hair first as usual with your normal shampoo.  3. After you shampoo, rinse your hair and body thoroughly to remove the shampoo.  4. Wash Face and genitals (private parts) with your normal soap.   5.  Shower the NIGHT BEFORE SURGERY and the MORNING OF SURGERY with CHG Soap.   6. Use CHG Soap as you would any other liquid soap. You can apply CHG directly to the skin and wash gently with a scrungie or a clean washcloth.   7. Apply the CHG Soap to your body ONLY FROM THE NECK DOWN.  Do not use on open wounds or open sores. Avoid contact with your  eyes, ears, mouth and genitals (private parts). Wash Face and genitals (private parts)  with your normal soap.   8. Wash thoroughly, paying special attention to the area where your surgery will be performed.  9. Thoroughly rinse your body with warm water from the neck down.  10. DO NOT shower/wash with your normal soap after using and rinsing off the CHG Soap.  11. Pat yourself dry with a CLEAN TOWEL.  12. Wear CLEAN PAJAMAS to bed the night before surgery  13. Place CLEAN SHEETS on your bed the night before your surgery  14. DO NOT SLEEP WITH PETS.   Day of Surgery: Take a shower with CHG soap. Wear Clean/Comfortable clothing the morning of surgery Do not apply any deodorants/lotions.   Remember to brush your teeth WITH YOUR REGULAR TOOTHPASTE.   Please read over the following fact sheets that you were given.

## 2020-11-20 NOTE — Progress Notes (Signed)
Anesthesia Chart Review:  Follows with cardiology for history of moderate AAS, HLD, TIA.  Last seen by Dr. Irish Lack 01/07/2020, doing well at that time, no changes to management.  Cardiac clearance per telephone encounter 07/18/2020 states, "From a CV standpoint, he was last seen by Dr. Irish Lack 12/2019 at which time he was doing well and was very active at baseline with no CV symptoms. He has had several recent surgeries without complications."  Patient reported last dose Plavix 11/15/2020.  History C3-6 ACDF.  Preop labs reviewed, unremarkable.  EKG 11/17/2020: NSR.  Rate 67.  Right unremarkable.  TTE 01/05/2018: - Left ventricle: The cavity size was normal. Wall thickness was  normal. Systolic function was normal. The estimated ejection  fraction was in the range of 60% to 65%. Wall motion was normal;  there were no regional wall motion abnormalities. Doppler  parameters are consistent with abnormal left ventricular  relaxation (grade 1 diastolic dysfunction).  - Aortic valve: Trileaflet; severely thickened, severely calcified  leaflets. Valve mobility was restricted. There was moderate  stenosis. Peak velocity (S): 312 cm/s. Mean gradient (S): 22 mm  Hg. Peak gradient (S): 39 mm Hg. Valve area (Vmax): 1.24 cm^2.    Wynonia Musty [facility] Short Stay Center/Anesthesiology Phone [phone] 11/20/2020 11:51 AM

## 2020-11-20 NOTE — Anesthesia Preprocedure Evaluation (Addendum)
Anesthesia Evaluation  Patient identified by MRN, date of birth, ID band Patient awake    Reviewed: Allergy & Precautions, NPO status , Patient's Chart, lab work & pertinent test results  History of Anesthesia Complications (+) PONV and history of anesthetic complications  Airway Mallampati: I  TM Distance: >3 FB Neck ROM: Full    Dental  (+) Dental Advisory Given, Missing   Pulmonary neg pulmonary ROS, neg shortness of breath,    Pulmonary exam normal breath sounds clear to auscultation       Cardiovascular (-) angina+ Valvular Problems/Murmurs AS  Rhythm:Regular Rate:Normal + Systolic murmurs Echo 1/66/06: - Left ventricle: The cavity size was normal. Wall thickness was normal. Systolic function was normal. The estimated ejection fraction was in the range of 60% to 65%. Wall motion was normal; there were no regional wall motion abnormalities. Doppler parameters are consistent with abnormal left ventricular relaxation (grade 1 diastolic dysfunction).  - Aortic valve: Trileaflet; severely thickened, severely calcified leaflets. Valve mobility was restricted. There was moderate stenosis. Peak velocity (S): 312 cm/s. Mean gradient (S): 22 mm Hg. Peak gradient (S): 39 mm Hg. Valve area (Vmax): 1.24 cm^2.    Neuro/Psych PSYCHIATRIC DISORDERS Anxiety Depression TIA Neuromuscular disease    GI/Hepatic Neg liver ROS, hiatal hernia, BILATERAL INGUINAL HERNIA   Endo/Other  negative endocrine ROS  Renal/GU negative Renal ROS     Musculoskeletal  (+) Arthritis ,   Abdominal   Peds  Hematology negative hematology ROS (+)   Anesthesia Other Findings Day of surgery medications reviewed with the patient.  Reproductive/Obstetrics                            Anesthesia Physical Anesthesia Plan  ASA: III  Anesthesia Plan: General   Post-op Pain Management:    Induction: Intravenous  PONV Risk Score and  Plan: 3 and Midazolam, Propofol infusion, Dexamethasone and Ondansetron  Airway Management Planned: Oral ETT  Additional Equipment:   Intra-op Plan:   Post-operative Plan: Extubation in OR  Informed Consent: I have reviewed the patients History and Physical, chart, labs and discussed the procedure including the risks, benefits and alternatives for the proposed anesthesia with the patient or authorized representative who has indicated his/her understanding and acceptance.     Dental advisory given  Plan Discussed with: CRNA  Anesthesia Plan Comments: (PAT note by Karoline Caldwell, PA-C: Follows with cardiology for history of moderate AAS, HLD, TIA.  Last seen by Dr. Irish Lack 01/07/2020, doing well at that time, no changes to management.  Cardiac clearance per telephone encounter 07/18/2020 states, "From a CV standpoint, he was last seen by Dr. Irish Lack 12/2019 at which time he was doing well and was very active at baseline with no CV symptoms. He has had several recent surgeries without complications."  Patient reported last dose Plavix 11/15/2020.  History C3-6 ACDF.  Preop labs reviewed, unremarkable.  EKG 11/17/2020: NSR.  Rate 67.  Right unremarkable.  TTE 01/05/2018: - Left ventricle: The cavity size was normal. Wall thickness was  normal. Systolic function was normal. The estimated ejection  fraction was in the range of 60% to 65%. Wall motion was normal;  there were no regional wall motion abnormalities. Doppler  parameters are consistent with abnormal left ventricular  relaxation (grade 1 diastolic dysfunction).  - Aortic valve: Trileaflet; severely thickened, severely calcified  leaflets. Valve mobility was restricted. There was moderate  stenosis. Peak velocity (S): 312 cm/s. Mean gradient (S):  22 mm  Hg. Peak gradient (S): 39 mm Hg. Valve area (Vmax): 1.24 cm^2. )       Anesthesia Quick Evaluation

## 2020-11-21 ENCOUNTER — Ambulatory Visit (HOSPITAL_COMMUNITY)
Admission: RE | Admit: 2020-11-21 | Discharge: 2020-11-21 | Disposition: A | Discharge status: Home / Self Care | Payer: Medicare Other | Attending: General Surgery | Admitting: General Surgery

## 2020-11-21 ENCOUNTER — Encounter (HOSPITAL_COMMUNITY)
Admission: RE | Disposition: A | Discharge status: Home / Self Care | Payer: Self-pay | Source: Home / Self Care | Attending: General Surgery

## 2020-11-21 ENCOUNTER — Other Ambulatory Visit: Payer: Self-pay

## 2020-11-21 ENCOUNTER — Ambulatory Visit (HOSPITAL_COMMUNITY): Payer: Medicare Other | Admitting: Certified Registered Nurse Anesthetist

## 2020-11-21 ENCOUNTER — Encounter (HOSPITAL_COMMUNITY): Payer: Self-pay | Admitting: General Surgery

## 2020-11-21 ENCOUNTER — Ambulatory Visit (HOSPITAL_COMMUNITY): Payer: Medicare Other | Admitting: Physician Assistant

## 2020-11-21 DIAGNOSIS — Z8673 Personal history of transient ischemic attack (TIA), and cerebral infarction without residual deficits: Secondary | ICD-10-CM | POA: Diagnosis not present

## 2020-11-21 DIAGNOSIS — I350 Nonrheumatic aortic (valve) stenosis: Secondary | ICD-10-CM | POA: Diagnosis not present

## 2020-11-21 DIAGNOSIS — K4020 Bilateral inguinal hernia, without obstruction or gangrene, not specified as recurrent: Secondary | ICD-10-CM | POA: Diagnosis present

## 2020-11-21 HISTORY — PX: XI ROBOTIC ASSISTED INGUINAL HERNIA REPAIR WITH MESH: SHX6706

## 2020-11-21 SURGERY — REPAIR, HERNIA, INGUINAL, ROBOT-ASSISTED, LAPAROSCOPIC, USING MESH
Anesthesia: General | Site: Abdomen | Laterality: Bilateral

## 2020-11-21 MED ORDER — ONDANSETRON HCL 4 MG/2ML IJ SOLN
INTRAMUSCULAR | Status: AC
  Filled 2020-11-21: qty 2

## 2020-11-21 MED ORDER — LIDOCAINE 2% (20 MG/ML) 5 ML SYRINGE
INTRAMUSCULAR | Status: DC | PRN
  Administered 2020-11-21: 60 mg via INTRAVENOUS

## 2020-11-21 MED ORDER — PHENYLEPHRINE HCL (PRESSORS) 10 MG/ML IV SOLN
INTRAVENOUS | Status: DC | PRN
  Administered 2020-11-21: 80 ug via INTRAVENOUS
  Administered 2020-11-21: 120 ug via INTRAVENOUS

## 2020-11-21 MED ORDER — TRAMADOL HCL 50 MG PO TABS: 50.0000 mg | ORAL_TABLET | Freq: Four times a day (QID) | ORAL | 0 refills | Status: DC | PRN

## 2020-11-21 MED ORDER — DEXAMETHASONE SODIUM PHOSPHATE 10 MG/ML IJ SOLN
INTRAMUSCULAR | Status: AC
  Filled 2020-11-21: qty 1

## 2020-11-21 MED ORDER — DEXAMETHASONE SODIUM PHOSPHATE 10 MG/ML IJ SOLN
INTRAMUSCULAR | Status: DC | PRN
  Administered 2020-11-21: 10 mg via INTRAVENOUS

## 2020-11-21 MED ORDER — PHENYLEPHRINE HCL-NACL 10-0.9 MG/250ML-% IV SOLN
INTRAVENOUS | Status: DC | PRN
  Administered 2020-11-21: 50 ug/min via INTRAVENOUS

## 2020-11-21 MED ORDER — LACTATED RINGERS IV SOLN: INTRAVENOUS | Status: DC

## 2020-11-21 MED ORDER — CHLORHEXIDINE GLUCONATE CLOTH 2 % EX PADS: 6.0000 | MEDICATED_PAD | Freq: Once | CUTANEOUS | Status: DC

## 2020-11-21 MED ORDER — PROPOFOL 10 MG/ML IV BOLUS
INTRAVENOUS | Status: DC | PRN
  Administered 2020-11-21: 150 mg via INTRAVENOUS

## 2020-11-21 MED ORDER — FENTANYL CITRATE (PF) 250 MCG/5ML IJ SOLN
INTRAMUSCULAR | Status: DC | PRN
  Administered 2020-11-21: 100 ug via INTRAVENOUS

## 2020-11-21 MED ORDER — ROCURONIUM BROMIDE 10 MG/ML (PF) SYRINGE
PREFILLED_SYRINGE | INTRAVENOUS | Status: AC
  Filled 2020-11-21: qty 20

## 2020-11-21 MED ORDER — ENSURE PRE-SURGERY PO LIQD: 296.0000 mL | Freq: Once | ORAL | Status: DC

## 2020-11-21 MED ORDER — CEFAZOLIN SODIUM-DEXTROSE 2-4 GM/100ML-% IV SOLN
2.0000 g | INTRAVENOUS | Status: AC
  Administered 2020-11-21: 2 g via INTRAVENOUS
  Filled 2020-11-21: qty 100

## 2020-11-21 MED ORDER — MIDAZOLAM HCL 2 MG/2ML IJ SOLN
INTRAMUSCULAR | Status: AC
  Filled 2020-11-21: qty 2

## 2020-11-21 MED ORDER — LIDOCAINE IN D5W 4-5 MG/ML-% IV SOLN
1.0000 mg/min | INTRAVENOUS | Status: DC
  Administered 2020-11-21: 25 ug/kg/min via INTRAVENOUS
  Filled 2020-11-21: qty 500

## 2020-11-21 MED ORDER — SODIUM CHLORIDE 0.9 % IV SOLN
INTRAVENOUS | Status: DC | PRN
  Administered 2020-11-21: 40 mL

## 2020-11-21 MED ORDER — BUPIVACAINE-EPINEPHRINE (PF) 0.25% -1:200000 IJ SOLN
INTRAMUSCULAR | Status: AC
  Filled 2020-11-21: qty 30

## 2020-11-21 MED ORDER — KETAMINE HCL 10 MG/ML IJ SOLN
INTRAMUSCULAR | Status: DC | PRN
  Administered 2020-11-21: 30 mg via INTRAVENOUS

## 2020-11-21 MED ORDER — PROPOFOL 500 MG/50ML IV EMUL
INTRAVENOUS | Status: DC | PRN
  Administered 2020-11-21: 25 ug/kg/min via INTRAVENOUS

## 2020-11-21 MED ORDER — PROPOFOL 10 MG/ML IV BOLUS
INTRAVENOUS | Status: AC
  Filled 2020-11-21: qty 20

## 2020-11-21 MED ORDER — FENTANYL CITRATE (PF) 250 MCG/5ML IJ SOLN
INTRAMUSCULAR | Status: AC
  Filled 2020-11-21: qty 5

## 2020-11-21 MED ORDER — CHLORHEXIDINE GLUCONATE 0.12 % MT SOLN
15.0000 mL | Freq: Once | OROMUCOSAL | Status: AC
  Administered 2020-11-21: 15 mL via OROMUCOSAL
  Filled 2020-11-21: qty 15

## 2020-11-21 MED ORDER — BUPIVACAINE-EPINEPHRINE (PF) 0.25% -1:200000 IJ SOLN
INTRAMUSCULAR | Status: DC | PRN
  Administered 2020-11-21: 8 mL

## 2020-11-21 MED ORDER — BUPIVACAINE LIPOSOME 1.3 % IJ SUSP
INTRAMUSCULAR | Status: AC
  Filled 2020-11-21: qty 20

## 2020-11-21 MED ORDER — ACETAMINOPHEN 500 MG PO TABS
1000.0000 mg | ORAL_TABLET | ORAL | Status: AC
  Administered 2020-11-21: 1000 mg via ORAL
  Filled 2020-11-21: qty 2

## 2020-11-21 MED ORDER — MIDAZOLAM HCL 5 MG/5ML IJ SOLN
INTRAMUSCULAR | Status: DC | PRN
  Administered 2020-11-21: 2 mg via INTRAVENOUS

## 2020-11-21 MED ORDER — STERILE WATER FOR IRRIGATION IR SOLN
Status: DC | PRN
  Administered 2020-11-21: 1000 mL

## 2020-11-21 MED ORDER — ORAL CARE MOUTH RINSE: 15.0000 mL | Freq: Once | OROMUCOSAL | Status: AC

## 2020-11-21 MED ORDER — SUGAMMADEX SODIUM 200 MG/2ML IV SOLN
INTRAVENOUS | Status: DC | PRN
  Administered 2020-11-21: 300 mg via INTRAVENOUS

## 2020-11-21 MED ORDER — EPHEDRINE SULFATE-NACL 50-0.9 MG/10ML-% IV SOSY
PREFILLED_SYRINGE | INTRAVENOUS | Status: DC | PRN
  Administered 2020-11-21: 10 mg via INTRAVENOUS

## 2020-11-21 MED ORDER — ONDANSETRON HCL 4 MG/2ML IJ SOLN
INTRAMUSCULAR | Status: DC | PRN
  Administered 2020-11-21: 4 mg via INTRAVENOUS

## 2020-11-21 MED ORDER — ROCURONIUM BROMIDE 10 MG/ML (PF) SYRINGE
PREFILLED_SYRINGE | INTRAVENOUS | Status: DC | PRN
  Administered 2020-11-21: 20 mg via INTRAVENOUS
  Administered 2020-11-21: 60 mg via INTRAVENOUS

## 2020-11-21 MED ORDER — KETAMINE HCL 50 MG/5ML IJ SOSY
PREFILLED_SYRINGE | INTRAMUSCULAR | Status: AC
  Filled 2020-11-21: qty 5

## 2020-11-21 MED ORDER — 0.9 % SODIUM CHLORIDE (POUR BTL) OPTIME
TOPICAL | Status: DC | PRN
  Administered 2020-11-21: 1000 mL

## 2020-11-21 MED ORDER — ROCURONIUM BROMIDE 10 MG/ML (PF) SYRINGE
PREFILLED_SYRINGE | INTRAVENOUS | Status: AC
  Filled 2020-11-21: qty 10

## 2020-11-21 SURGICAL SUPPLY — 44 items
CHLORAPREP W/TINT 26 (MISCELLANEOUS) ×3 IMPLANT
COVER MAYO STAND STRL (DRAPES) ×3 IMPLANT
COVER SURGICAL LIGHT HANDLE (MISCELLANEOUS) ×3 IMPLANT
COVER TIP SHEARS 8 DVNC (MISCELLANEOUS) ×1 IMPLANT
COVER TIP SHEARS 8MM DA VINCI (MISCELLANEOUS) ×2
DECANTER SPIKE VIAL GLASS SM (MISCELLANEOUS) ×3 IMPLANT
DEFOGGER SCOPE WARMER CLEARIFY (MISCELLANEOUS) ×3 IMPLANT
DERMABOND ADVANCED (GAUZE/BANDAGES/DRESSINGS) ×2
DERMABOND ADVANCED .7 DNX12 (GAUZE/BANDAGES/DRESSINGS) ×1 IMPLANT
DEVICE TROCAR PUNCTURE CLOSURE (ENDOMECHANICALS) ×3 IMPLANT
DRAPE ARM DVNC X/XI (DISPOSABLE) ×4 IMPLANT
DRAPE COLUMN DVNC XI (DISPOSABLE) ×1 IMPLANT
DRAPE CV SPLIT W-CLR ANES SCRN (DRAPES) ×3 IMPLANT
DRAPE DA VINCI XI ARM (DISPOSABLE) ×8
DRAPE DA VINCI XI COLUMN (DISPOSABLE) ×2
DRAPE ORTHO SPLIT 77X108 STRL (DRAPES) ×2
DRAPE SURG ORHT 6 SPLT 77X108 (DRAPES) ×1 IMPLANT
ELECT REM PT RETURN 9FT ADLT (ELECTROSURGICAL) ×3
ELECTRODE REM PT RTRN 9FT ADLT (ELECTROSURGICAL) ×1 IMPLANT
GLOVE BIO SURGEON STRL SZ7.5 (GLOVE) ×6 IMPLANT
GOWN STRL REUS W/ TWL LRG LVL3 (GOWN DISPOSABLE) ×2 IMPLANT
GOWN STRL REUS W/ TWL XL LVL3 (GOWN DISPOSABLE) ×2 IMPLANT
GOWN STRL REUS W/TWL 2XL LVL3 (GOWN DISPOSABLE) ×3 IMPLANT
GOWN STRL REUS W/TWL LRG LVL3 (GOWN DISPOSABLE) ×4
GOWN STRL REUS W/TWL XL LVL3 (GOWN DISPOSABLE) ×4
KIT BASIN OR (CUSTOM PROCEDURE TRAY) ×3 IMPLANT
KIT TURNOVER KIT B (KITS) ×3 IMPLANT
MARKER SKIN DUAL TIP RULER LAB (MISCELLANEOUS) ×3 IMPLANT
MESH PROGRIP LAP SELF FIXATING (Mesh General) ×4 IMPLANT
MESH PROGRIP LAP SLF FIX 16X12 (Mesh General) ×2 IMPLANT
NEEDLE HYPO 22GX1.5 SAFETY (NEEDLE) ×3 IMPLANT
NEEDLE INSUFFLATION 14GA 120MM (NEEDLE) ×3 IMPLANT
PAD ARMBOARD 7.5X6 YLW CONV (MISCELLANEOUS) ×6 IMPLANT
SEAL CANN UNIV 5-8 DVNC XI (MISCELLANEOUS) ×2 IMPLANT
SEAL XI 5MM-8MM UNIVERSAL (MISCELLANEOUS) ×4
SET TUBE SMOKE EVAC HIGH FLOW (TUBING) ×3 IMPLANT
STOPCOCK 4 WAY LG BORE MALE ST (IV SETS) ×3 IMPLANT
SUT MNCRL AB 4-0 PS2 18 (SUTURE) ×3 IMPLANT
SUT VIC AB 2-0 SH 27 (SUTURE)
SUT VIC AB 2-0 SH 27X BRD (SUTURE) IMPLANT
SUT VLOC 180 2-0 6IN GS21 (SUTURE) ×6 IMPLANT
SYR TOOMEY 50ML (SYRINGE) ×3 IMPLANT
TOWEL GREEN STERILE FF (TOWEL DISPOSABLE) ×3 IMPLANT
TRAY LAPAROSCOPIC MC (CUSTOM PROCEDURE TRAY) ×3 IMPLANT

## 2020-11-21 NOTE — Anesthesia Procedure Notes (Signed)
Procedure Name: Intubation Date/Time: 11/21/2020 11:30 AM Performed by: Terrence Dupont, CRNA Pre-anesthesia Checklist: Patient identified, Emergency Drugs available, Suction available and Patient being monitored Patient Re-evaluated:Patient Re-evaluated prior to induction Oxygen Delivery Method: Circle system utilized Preoxygenation: Pre-oxygenation with 100% oxygen Induction Type: IV induction Ventilation: Mask ventilation without difficulty Grade View: Grade I Tube type: Oral Tube size: 7.5 mm Number of attempts: 1 Airway Equipment and Method: Stylet Placement Confirmation: ETT inserted through vocal cords under direct vision,  positive ETCO2 and breath sounds checked- equal and bilateral Secured at: 22 cm Tube secured with: Tape Dental Injury: Teeth and Oropharynx as per pre-operative assessment

## 2020-11-21 NOTE — Transfer of Care (Signed)
Immediate Anesthesia Transfer of Care Note  Patient: Nicholas Wise  Procedure(s) Performed: ROBOTIC BILATERAL INGUINAL HERNIA REPAIR WITH MESH (Bilateral Abdomen)  Patient Location: PACU  Anesthesia Type:General  Level of Consciousness: awake and alert   Airway & Oxygen Therapy: Patient Spontanous Breathing and Patient connected to face mask oxygen  Post-op Assessment: Report given to RN and Post -op Vital signs reviewed and stable  Post vital signs: Reviewed and stable  Last Vitals:  Vitals Value Taken Time  BP 137/68 11/21/20 1250  Temp    Pulse 77 11/21/20 1250  Resp 9 11/21/20 1250  SpO2 100 % 11/21/20 1250  Vitals shown include unvalidated device data.  Last Pain:  Vitals:   11/21/20 0827  TempSrc:   PainSc: 0-No pain         Complications: No complications documented.

## 2020-11-21 NOTE — H&P (Signed)
History of Present Illness The patient is a 75 year old male who presents with an inguinal hernia. Patient is a 75 year old male who comes in secondary to referral for minimally invasive bilateral hernia repair. As per below the patient's had 6 -8 month history of bilateral hernias, right greater than left. He states he has some mild discomfort and pain as well as burning sensation. Patient states he is active outside 50 some lifting gardening.  Patient does have a history of ear stenosis as well as TIA. Currently on Plavix. ------------------  CHIEF COMPLAINT: right inguinal hernia  Patient is referred by Dellia Nims, NP, and Dr. Pat Kocher, in [place], [place], for surgical evaluation and management of right inguinal hernia. Patient first noted a bulge in the right groin approximately 6 months ago. This has gradually enlarged. He is noted intermittent mild burning. He has had no signs or symptoms of intestinal obstruction. He has had no prior abdominal surgery. He has had no prior hernia repairs. From a medical standpoint, the patient does have a history of TIA and takes Plavix. He is also followed by Dr. Casandra Doffing from cardiology for aortic stenosis. Patient denies any signs or symptoms of intestinal obstruction. He presents today for evaluation for hernia repair.   Allergies  No Known Drug Allergies  [06/20/2020]: Allergies Reconciled   Medication History  Clopidogrel Bisulfate (75MG  Tablet, Oral) Active. Sertraline HCl (100MG  Tablet, Oral) Active. Tylenol Extra Strength (500MG  Tablet, Oral) Active. Allopurinol (300MG  Tablet, Oral) Active. Atorvastatin Calcium (40MG  Tablet, Oral) Active. Finasteride (5MG  Tablet, Oral) Active. Tamsulosin HCl (0.4MG  Capsule, Oral) Active. Potassium Citrate ER (10 MEQ(1080 MG) Tablet ER, Oral) Active. Vitamin D (1 (one) Oral) Specific strength unknown - Active. Medications Reconciled    Review of  Systems General Not Present- Appetite Loss, Chills, Fatigue, Fever, Night Sweats, Weight Gain and Weight Loss. Skin Not Present- Change in Wart/Mole, Dryness, Hives, Jaundice, New Lesions, Non-Healing Wounds, Rash and Ulcer. HEENT Present- Ringing in the Ears, Seasonal Allergies and Wears glasses/contact lenses. Not Present- Earache, Hearing Loss, Hoarseness, Nose Bleed, Oral Ulcers, Sinus Pain, Sore Throat, Visual Disturbances and Yellow Eyes. Respiratory Present- Snoring. Not Present- Bloody sputum, Chronic Cough, Difficulty Breathing and Wheezing. Breast Not Present- Breast Mass, Breast Pain, Nipple Discharge and Skin Changes. Cardiovascular Not Present- Chest Pain, Difficulty Breathing Lying Down, Leg Cramps, Palpitations, Rapid Heart Rate, Shortness of Breath and Swelling of Extremities. Gastrointestinal Not Present- Abdominal Pain, Bloating, Bloody Stool, Change in Bowel Habits, Chronic diarrhea, Constipation, Difficulty Swallowing, Excessive gas, Gets full quickly at meals, Hemorrhoids, Indigestion, Nausea, Rectal Pain and Vomiting. Male Genitourinary Not Present- Blood in Urine, Change in Urinary Stream, Frequency, Impotence, Nocturia, Painful Urination, Urgency and Urine Leakage. Musculoskeletal Present- Joint Pain. Not Present- Back Pain, Joint Stiffness, Muscle Pain, Muscle Weakness and Swelling of Extremities. Neurological Not Present- Decreased Memory, Fainting, Headaches, Numbness, Seizures, Tingling, Tremor, Trouble walking and Weakness. Psychiatric Present- Anxiety. Not Present- Bipolar, Change in Sleep Pattern, Depression, Fearful and Frequent crying. Endocrine Not Present- Cold Intolerance, Excessive Hunger, Hair Changes, Heat Intolerance and New Diabetes. Hematology Present- Blood Thinners and Easy Bruising. Not Present- Excessive bleeding, Gland problems, HIV and Persistent Infections.  BP 134/84   Pulse 86   Temp 97.8 F (36.6 C) (Oral)   Resp 20   Ht 5\' 7"  (1.702 m)   Wt  77.5 kg   SpO2 99%   BMI 26.76 kg/m        Physical Exam The physical exam findings are as follows: Note: Constitutional:  No acute distress, conversant, appears stated age  Eyes: Anicteric sclerae, moist conjunctiva, no lid lag  Neck: No thyromegaly, trachea midline, no cervical lymphadenopathy  Lungs: Clear to auscultation biilaterally, normal respiratory effot  Cardiovascular: regular rate & rhythm, no murmurs, no peripheal edema, pedal pulses 2+  GI: Soft, no masses or hepatosplenomegaly, non-tender to palpation, bilateral inguinal hernias, right greater than left.  MSK: Normal gait, no clubbing cyanosis, edema  Skin: No rashes, palpation reveals normal skin turgor  Psychiatric: Appropriate judgment and insight, oriented to person, place, and time    Assessment & Plan BILATERAL DIRECT INGUINAL HERNIA (K40.20) Impression: 75 year old male with a history of TIA, aortic stenosis, on Plavix, with bilateral inguinal hernias. We'll obtain clearance to be off his Plavix as well as cardiac clearance prior to scheduling surgery. 1. The patient will like to proceed to the operating room for robotic bilateral inguinal hernia repair with mesh.  2. I discussed with the patient the signs and symptoms of incarceration and strangulation and the need to proceed to the ER should they occur.  3. I discussed with the patient the risks and benefits of the procedure to include but not limited to: Infection, bleeding, damage to surrounding structures, possible need for further surgery, possible nerve pain, and possible recurrence. The patient was understanding and wishes to proceed.

## 2020-11-21 NOTE — Op Note (Signed)
11/21/2020  12:25 PM  PATIENT:  Nicholas Wise  75 y.o. male  PRE-OPERATIVE DIAGNOSIS:  BILATERAL INGUINAL HERNIA  POST-OPERATIVE DIAGNOSIS:  BILATERAL DIRECT INGUINAL HERNIA  PROCEDURE:  Procedure(s): ROBOTIC BILATERAL INGUINAL HERNIA REPAIR WITH MESH (Bilateral)  SURGEON:  Surgeon(s) and Role:    * Ralene Ok, MD - Primary  ASSISTANTS: Pryor Curia, RNFA   ANESTHESIA:   local, regional and general  EBL:  minimal   BLOOD ADMINISTERED:none  DRAINS: none   LOCAL MEDICATIONS USED:  BUPIVICAINE  and OTHER EXPAREL   SPECIMEN:  No Specimen  DISPOSITION OF SPECIMEN:  N/A  COUNTS:  YES  TOURNIQUET:  * No tourniquets in log *  DICTATION: .Dragon Dictation  Details of the procedure:  Findings: Patient had large bilateral direct inguinal hernias.  Patient had 16 x 12 cm ProGrip mesh placed on each side.    The patient was taken back to the operating room. The patient was placed in supine position with bilateral SCDs in place.   The patient was prepped and draped in the usual sterile fashion.  After appropriate anitbiotics were confirmed, a time-out was confirmed and all facts were verified.  At this time a Veress needle technique was used to inspect the abdomen approximately 10 cm from the valgus and the paramedian line. This time a 8 mm robotic trocar was placed into the abdomen. The camera was placed there was no injury to any intra-abdominal organs. A 78mm umbilical port was placed just superior to the umbilicus. An 8 mm port was placed approximately 10 cm lateral to the umbilicus on the left paramedian side.   Robot was positioned over the patient and the ports were docked in the usual fashion.  At this time the right-sided peritoneum was taken down from the medial umbilical ligament laterally. The pre-peritoneal space was entered. Dissection was taken down to Cooper's ligament. At this time it was apparent there was a large direct hernia. The hernia was retracted  and dissected away from the transversalis fascia. Transversalis fascia spontaneously retracted.  At this time proceeded clean out the rest Cooper's ligament and the medial to lateral direction. A proceeded laterally to dissect the spermatic cord. The spermatic cord was circumferentially dissected away from the surrounding musculature and tissue. The vas deferens was identified and protected all portions of the case. There was no indirect hernia at the base of the spermatic cord. This was dissected back. At this time I proceeded to create a pocket laterally for the mesh.   The left-sided peritoneum was taken down from the median umbilical ligament laterally.  Dissection was taken down to Cooper's ligament.  There was the direct hernia seen on the left side.  This was circumferentially dissected away.  The transversalis fascia retracted spontaneously.  The hernia's were both large in size.  At this time I proceeded to dissect laterally to the spermatic cord.  This was circumferentially dissected away.  The vas deferens was identified and protected all portion of the case.  This time a pocket was created laterally for the mesh. At this time ProGrip left and right side mesh were placed into the preperitoneal space.  These were 16 x 12 cm in size.  These were placed in cover the direct, indirect and femoral spaces bilaterally.  At this time A 2 V- lock stitch was used to close the peritoneum in a standard running fashion bilaterally  At this time the robot was undocked. The umbilical port site was reapproximated using a 0  Vicryl via an Endo Close device 1. All ports were removed. The skin was reapproximated and all port sites using 4-0 Monocryl subcuticular fashion.  The patient the procedure well was taken to the recovery     PLAN OF CARE: Discharge to home after PACU  PATIENT DISPOSITION:  PACU - hemodynamically stable.   Delay start of Pharmacological VTE agent (>24hrs) due to surgical blood loss  or risk of bleeding: not applicable

## 2020-11-21 NOTE — Anesthesia Postprocedure Evaluation (Signed)
Anesthesia Post Note  Patient: Nicholas Wise  Procedure(s) Performed: ROBOTIC BILATERAL INGUINAL HERNIA REPAIR WITH MESH (Bilateral Abdomen)     Patient location during evaluation: PACU Anesthesia Type: General Level of consciousness: awake and alert, oriented and awake Pain management: pain level controlled Vital Signs Assessment: post-procedure vital signs reviewed and stable Respiratory status: spontaneous breathing, nonlabored ventilation and respiratory function stable Cardiovascular status: blood pressure returned to baseline and stable Postop Assessment: no apparent nausea or vomiting Anesthetic complications: no   No complications documented.  Last Vitals:  Vitals:   11/21/20 1305 11/21/20 1320  BP: 132/75 125/69  Pulse: 76 75  Resp: 15 20  Temp:  (!) 36.3 C  SpO2: 98% 94%    Last Pain:  Vitals:   11/21/20 1320  TempSrc:   PainSc: 0-No pain                 Catalina Gravel

## 2020-11-21 NOTE — Discharge Instructions (Signed)
[facility] _______[facility] °INGUINAL HERNIA REPAIR: POST OP INSTRUCTIONS ° °Always review your discharge instruction sheet given to you by the facility where your surgery was performed. °IF YOU HAVE DISABILITY OR FAMILY LEAVE FORMS, YOU MUST BRING THEM TO THE OFFICE FOR PROCESSING.   °DO NOT GIVE THEM TO YOUR DOCTOR. ° °1. A  prescription for pain medication may be given to you upon discharge.  Take your pain medication as prescribed, if needed.  If narcotic pain medicine is not needed, then you may take acetaminophen (Tylenol) or ibuprofen (Advil) as needed. °2. Take your usually prescribed medications unless otherwise directed. °If you need a refill on your pain medication, please contact your pharmacy.  They will contact our office to request authorization. Prescriptions will not be filled after 5 pm or on week-ends. °3. You should follow a light diet the first 24 hours after arrival home, such as soup and crackers, etc.  Be sure to include lots of fluids daily.  Resume your normal diet the day after surgery. °4.Most patients will experience some swelling and bruising around the umbilicus or in the groin and scrotum.  Ice packs and reclining will help.  Swelling and bruising can take several days to resolve.  °6. It is common to experience some constipation if taking pain medication after surgery.  Increasing fluid intake and taking a stool softener (such as Colace) will usually help or prevent this problem from occurring.  A mild laxative (Milk of Magnesia or Miralax) should be taken according to package directions if there are no bowel movements after 48 hours. °7. Unless discharge instructions indicate otherwise, you may remove your bandages 24-48 hours after surgery, and you may shower at that time.  You may have steri-strips (small skin tapes) in place directly over the incision.  These strips should be left on the skin for 7-10 days.  If your surgeon used skin glue on the incision, you may  shower in 24 hours.  The glue will flake off over the next 2-3 weeks.  Any sutures or staples will be removed at the office during your follow-up visit. °8. ACTIVITIES:  You may resume regular (light) daily activities beginning the next day--such as daily self-care, walking, climbing stairs--gradually increasing activities as tolerated.  You may have sexual intercourse when it is comfortable.  Refrain from any heavy lifting or straining until approved by your doctor. ° °a.You may drive when you are no longer taking prescription pain medication, you can comfortably wear a seatbelt, and you can safely maneuver your car and apply brakes. °b.RETURN TO WORK:   °_____________________________________________ ° °9.You should see your doctor in the office for a follow-up appointment approximately 2-3 weeks after your surgery.  Make sure that you call for this appointment within a day or two after you arrive home to insure a convenient appointment time. °10.OTHER INSTRUCTIONS: _________________________ °   _____________________________________ ° °WHEN TO CALL YOUR DOCTOR: °1. Fever over 101.0 °2. Inability to urinate °3. Nausea and/or vomiting °4. Extreme swelling or bruising °5. Continued bleeding from incision. °6. Increased pain, redness, or drainage from the incision ° °The clinic staff is available to answer your questions during regular business hours.  Please don’t hesitate to call and ask to speak to one of the nurses for clinical concerns.  If you have a medical emergency, go to the nearest emergency room or call 911.  A surgeon from [facility] is always on call at the hospital ° ° °[address], [address], [place], [place]  [address] ? ° [address] [address], [place], [place]   [address] °[phone] ? [phone] ? FAX [phone] °Web site: [email or web address] ° °

## 2020-11-22 ENCOUNTER — Encounter (HOSPITAL_COMMUNITY): Payer: Self-pay | Admitting: General Surgery

## 2021-01-07 NOTE — Progress Notes (Signed)
Cardiology Office Note   Date:  01/08/2021   ID:  Nicholas Wise, DOB 1946/07/10, MRN [id number]  PCP:  Josem Kaufmann, MD    No chief complaint on file.  Aortic stenosis  Wt Readings from Last 3 Encounters:  01/08/21 169 lb 9.6 oz (76.9 kg)  11/21/20 170 lb 13.7 oz (77.5 kg)  11/17/20 170 lb 12.8 oz (77.5 kg)       History of Present Illness: Nicholas Wise is a 75 y.o. male  who I saw for a murmur in 2019.   TIA in 2015. On Plavix since then.   Echo in 2019 showed: Left ventricle: The cavity size was normal. Wall thickness was   normal. Systolic function was normal. The estimated ejection   fraction was in the range of 60% to 65%. Wall motion was normal;   there were no regional wall motion abnormalities. Doppler   parameters are consistent with abnormal left ventricular   relaxation (grade 1 diastolic dysfunction). - Aortic valve: Trileaflet; severely thickened, severely calcified   leaflets. Valve mobility was restricted. There was moderate   stenosis. Peak velocity (S): 312 cm/s. Mean gradient (S): 22 mm   Hg. Peak gradient (S): 39 mm Hg. Valve area (Vmax): 1.24 cm^2.   He had his back surgery in 8/19.  This was successful.  In 2020, He went back to work.  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    In November 2020, he had knee replacement.  He did well with this.  No cardiac issues at the time.  Declined COVID vaccines in the past.   Denies : Chest pain. Dizziness. Leg edema. Nitroglycerin use. Orthopnea. Palpitations. Paroxysmal nocturnal dyspnea. Shortness of breath. Syncope.   Walking has decreased. Had hernia surgery 6 weeks ago. Recovering well.   Increasing walking.   Past Medical History:  Diagnosis Date   Anxiety    Aortic stenosis    Arthritis    Depression    Hemorrhoids    Hiatal hernia    High cholesterol    History of kidney stones    Hx of adenomatous polyp of colon 01/31/2017   Numbness and tingling in left  hand    PONV (postoperative nausea and vomiting)    bladder spasms went home after neck surgery w/ catheter   Stroke ([facility] [place]) 11/09/2013   Tia    TIA (transient ischemic attack)    Urinary frequency     Past Surgical History:  Procedure Laterality Date   ANTERIOR CERVICAL DECOMP/DISCECTOMY FUSION N/A 04/11/2014   Procedure: ANTERIOR CERVICAL DECOMPRESSION/DISCECTOMY FUSION CERVICAL THREE-FOUR,CERVICAL FOUR-FIVE,CERVICAL FIVE-SIX;  Surgeon: Erline Levine, MD;  Location: [facility];  Service: Neurosurgery;  Laterality: N/A;   ANTERIOR LAT LUMBAR FUSION Left 02/20/2018   Procedure: Left Lumbar Four-Five Anterolateral lumbar interbody fusion;  Surgeon: Erline Levine, MD;  Location: [facility];  Service: Neurosurgery;  Laterality: Left;   BACK SURGERY     COLONOSCOPY     KNEE SURGERY Right    LUMBAR PERCUTANEOUS PEDICLE SCREW 1 LEVEL N/A 02/20/2018   Procedure: Lumbar Percutaneous Pedicle Screw Placment Lumbar four-five;  Surgeon: Erline Levine, MD;  Location: [facility];  Service: Neurosurgery;  Laterality: N/A;   TONSILLECTOMY     TOTAL KNEE ARTHROPLASTY Right 06/08/2019   Procedure: RIGHT TOTAL KNEE ARTHROPLASTY;  Surgeon: Melrose Nakayama, MD;  Location: [facility];  Service: Orthopedics;  Laterality: Right;   XI ROBOTIC ASSISTED INGUINAL HERNIA REPAIR WITH MESH  Bilateral 11/21/2020   Procedure: ROBOTIC BILATERAL INGUINAL HERNIA REPAIR WITH MESH;  Surgeon: Ralene Ok, MD;  Location: [facility];  Service: General;  Laterality: Bilateral;     Current Outpatient Medications  Medication Sig Dispense Refill   acetaminophen (TYLENOL) 500 MG tablet Take 500 mg by mouth as needed for mild pain or headache.      allopurinol (ZYLOPRIM) 300 MG tablet Take 300 mg by mouth daily.     ALPRAZolam (XANAX) 0.5 MG tablet Take 0.25 mg by mouth 2 (two) times daily as needed for anxiety.     APPLE CIDER VINEGAR PO Take 1 each by mouth daily.     atorvastatin (LIPITOR) 40 MG tablet Take 40 mg by mouth every evening.       BLACK ELDERBERRY PO Take 1 each by mouth daily.     cholecalciferol (VITAMIN D3) 25 MCG (1000 UNIT) tablet Take 1,000 Units by mouth daily.     clopidogrel (PLAVIX) 75 MG tablet Take 1 tablet (75 mg total) by mouth daily.     finasteride (PROSCAR) 5 MG tablet Take 5 mg by mouth daily.     fluorouracil (EFUDEX) 5 % cream SMARTSIG:1 Topical Daily PRN     Polyethyl Glycol-Propyl Glycol (SYSTANE OP) Place 1 drop into both eyes daily.      potassium citrate (UROCIT-K) 10 MEQ (1080 MG) SR tablet Take 10 mEq by mouth 2 (two) times daily.     Saline (SIMPLY SALINE) 0.9 % AERS Place 1 spray into the nose every morning.     selenium 50 MCG TABS tablet Take 50 mcg by mouth daily.     sertraline (ZOLOFT) 100 MG tablet Take 100 mg by mouth at bedtime.     tamsulosin (FLOMAX) 0.4 MG CAPS capsule Take 0.4 mg by mouth 2 (two) times daily.     traMADol (ULTRAM) 50 MG tablet Take 1 tablet (50 mg total) by mouth every 6 (six) hours as needed. 20 tablet 0   triamcinolone (NASACORT) 55 MCG/ACT AERO nasal inhaler Place 1 spray into the nose daily.     zinc gluconate 50 MG tablet Take 50 mg by mouth daily.     No current facility-administered medications for this visit.    Allergies:   Patient has no known allergies.    Social History:  The patient  reports that he has never smoked. He has never used smokeless tobacco. He reports current alcohol use. He reports that he does not use drugs.   Family History:  The patient's family history includes Diabetes in his father; Heart disease in his father.    ROS:  Please see the history of present illness.   Otherwise, review of systems are positive for improving abdominal pain post hernia surgery.   All other systems are reviewed and negative.    PHYSICAL EXAM: VS:  BP 140/80   Pulse 63   Ht 5\' 9"  (1.753 m)   Wt 169 lb 9.6 oz (76.9 kg)   SpO2 96%   BMI 25.05 kg/m  , BMI Body mass index is 25.05 kg/m. GEN: Well nourished, well developed, in no acute  distress HEENT: normal Neck: no JVD, carotid bruits, or masses Cardiac: RRR; 3/6 systolic murmurs, rubs, or gallops,no edema  Respiratory:  clear to auscultation bilaterally, normal work of breathing GI: soft, nontender, nondistended, + BS MS: no deformity or atrophy Skin: warm and dry, no rash Neuro:  Strength and sensation are intact Psych: euthymic mood, full affect   EKG:  The ekg ordered today demonstrates NSR, RBBB   Recent Labs: 11/17/2020: BUN 18; Creatinine, Ser 0.67; Hemoglobin 13.2; Platelets 168; Potassium 4.4; Sodium 136   Lipid Panel No results found for: CHOL, TRIG, HDL, CHOLHDL, VLDL, LDLCALC, LDLDIRECT   Other studies Reviewed: Additional studies/ records that were reviewed today with results demonstrating: labs reviewed.   ASSESSMENT AND PLAN:  Moderate aortic stenosis: Watch for syncope, dizziness, chest pain and fluid overload. Planning for echo in 2022.   Hyperlipidemia: The current medical regimen is effective;  continue present plan and medications.  Prior TIA: Has been on clopidogrel.  No bleeding problems.  Family h/o heart disease: father with CABG in early 20s.  Whole food, plant-based diet.  He eats vegetables fibers.  Continue regular exercise.  THis will improve since he has had successful hernia surgery.     Current medicines are reviewed at length with the patient today.  The patient concerns regarding his medicines were addressed.  The following changes have been made:  No change  Labs/ tests ordered today include:  No orders of the defined types were placed in this encounter.   Recommend 150 minutes/week of aerobic exercise Low fat, low carb, high fiber diet recommended  Disposition:   FU in 6 months   Signed, Larae Grooms, MD  01/08/2021 11:17 AM    [address], [place], [place]  [address] Phone: [phone]; Fax: [phone]

## 2021-01-08 ENCOUNTER — Ambulatory Visit (HOSPITAL_COMMUNITY): Payer: Medicare Other | Attending: Cardiovascular Disease

## 2021-01-08 ENCOUNTER — Ambulatory Visit (INDEPENDENT_AMBULATORY_CARE_PROVIDER_SITE_OTHER): Payer: Medicare Other | Admitting: Interventional Cardiology

## 2021-01-08 ENCOUNTER — Encounter: Payer: Self-pay | Admitting: Interventional Cardiology

## 2021-01-08 ENCOUNTER — Telehealth: Payer: Self-pay | Admitting: *Deleted

## 2021-01-08 ENCOUNTER — Other Ambulatory Visit: Payer: Self-pay

## 2021-01-08 VITALS — BP 140/80 | HR 63 | Ht 69.0 in | Wt 169.6 lb

## 2021-01-08 DIAGNOSIS — I350 Nonrheumatic aortic (valve) stenosis: Secondary | ICD-10-CM

## 2021-01-08 DIAGNOSIS — E782 Mixed hyperlipidemia: Secondary | ICD-10-CM | POA: Diagnosis not present

## 2021-01-08 DIAGNOSIS — Z8673 Personal history of transient ischemic attack (TIA), and cerebral infarction without residual deficits: Secondary | ICD-10-CM | POA: Diagnosis not present

## 2021-01-08 LAB — ECHOCARDIOGRAM COMPLETE
AR max vel: 0.7 cm2
AV Area VTI: 0.66 cm2
AV Area mean vel: 0.62 cm2
AV Mean grad: 40.2 mmHg
AV Peak grad: 59.9 mmHg
Ao pk vel: 3.87 m/s
Area-P 1/2: 2.5 cm2
P 1/2 time: 606 msec
S' Lateral: 2.8 cm

## 2021-01-08 NOTE — Telephone Encounter (Signed)
I spoke with patient and reviewed echo results with him.  He would like to have echo and see Dr Irish Lack the same day in 6 months.  Patient will call if he develops any symptoms

## 2021-01-08 NOTE — Telephone Encounter (Signed)
Dr Irish Lack will be in the office on 12/28 in the afternoon.  Per DPR it is OK to leave message on voicemail.  I left message for patient to call office if he would like to schedule test and appointment on 12/28

## 2021-01-08 NOTE — Telephone Encounter (Signed)
-----   Message from Jettie Booze, MD sent at 01/08/2021  5:57 PM EDT ----- NSR.  Aortic valve stenosis has now crossed into the severe range. Watch for sx of CHF, DOE, syncope, chest pain.  F/u in 6 months.  Given his lack of sx, would just watch for another 6 months unless sx develop.

## 2021-01-08 NOTE — Patient Instructions (Signed)
Medication Instructions:  Your physician recommends that you continue on your current medications as directed. Please refer to the Current Medication list given to you today.  *If you need a refill on your cardiac medications before your next appointment, please call your pharmacy*   Lab Work: none If you have labs (blood work) drawn today and your tests are completely normal, you will receive your results only by: [place] (if you have MyChart) OR A paper copy in the mail If you have any lab test that is abnormal or we need to change your treatment, we will call you to review the results.   Testing/Procedures: We will contact you with echocardiogram results   Follow-Up: At [facility], you and your health needs are our priority.  As part of our continuing mission to provide you with exceptional heart care, we have created designated Provider Care Teams.  These Care Teams include your primary Cardiologist (physician) and Advanced Practice Providers (APPs -  Physician Assistants and Nurse Practitioners) who all work together to provide you with the care you need, when you need it.  We recommend signing up for the patient portal called "MyChart".  Sign up information is provided on this After Visit Summary.  MyChart is used to connect with patients for Virtual Visits (Telemedicine).  Patients are able to view lab/test results, encounter notes, upcoming appointments, etc.  Non-urgent messages can be sent to your provider as well.   To learn more about what you can do with MyChart, go to [email or web address].    Your next appointment:   6 month(s)  The format for your next appointment:   In Person  Provider:   You may see Larae Grooms, MD or one of the following Advanced Practice Providers on your designated Care Team:   Melina Copa, PA-C Ermalinda Barrios, PA-C   Other Instructions

## 2021-01-10 ENCOUNTER — Other Ambulatory Visit: Payer: Self-pay | Admitting: Neurosurgery

## 2021-01-10 DIAGNOSIS — M545 Low back pain, unspecified: Secondary | ICD-10-CM

## 2021-01-11 NOTE — Telephone Encounter (Signed)
Appointments have been scheduled.

## 2021-01-27 ENCOUNTER — Other Ambulatory Visit: Payer: Self-pay

## 2021-01-27 ENCOUNTER — Ambulatory Visit
Admission: RE | Admit: 2021-01-27 | Discharge: 2021-01-27 | Disposition: A | Discharge status: Home / Self Care | Payer: Medicare Other | Source: Ambulatory Visit | Attending: Neurosurgery | Admitting: Neurosurgery

## 2021-01-27 DIAGNOSIS — M545 Low back pain, unspecified: Secondary | ICD-10-CM

## 2021-01-27 MED ORDER — GADOBENATE DIMEGLUMINE 529 MG/ML IV SOLN
17.0000 mL | Freq: Once | INTRAVENOUS | Status: AC | PRN
  Administered 2021-01-27: 17 mL via INTRAVENOUS

## 2021-03-02 ENCOUNTER — Telehealth: Payer: Self-pay | Admitting: Interventional Cardiology

## 2021-03-02 NOTE — Telephone Encounter (Signed)
Left message for patient to call back  

## 2021-03-02 NOTE — Telephone Encounter (Signed)
Pt returning your call, please advise.

## 2021-03-02 NOTE — Telephone Encounter (Signed)
STAT if patient feels like he/she is going to faint   Are you dizzy now? No   Do you feel faint or have you passed out? No   Do you have any other symptoms? Vision black, feeling winded, lightheaded   Have you checked your HR and BP (record if available)? No  Pt c/o Shortness Of Breath: STAT if SOB developed within the last 24 hours or pt is noticeably SOB on the phone  1. Are you currently SOB (can you hear that pt is SOB on the phone)? no  2. How long have you been experiencing SOB? The last few weeks  3. Are you SOB when sitting or when up moving around? Nicholas Wise going up and down the steps  4. Are you currently experiencing any other symptoms? Dizziness, lightheadedness, vision went black

## 2021-03-12 NOTE — Telephone Encounter (Signed)
Pt has not returned call after multiple message left.  Will close this encounter and await a return call.

## 2021-03-14 ENCOUNTER — Telehealth: Payer: Self-pay | Admitting: Interventional Cardiology

## 2021-03-14 NOTE — Telephone Encounter (Signed)
Called pt back in regards to fatigue.  He reports that he has gradually noted that he is becoming more winded.  He expressed that on 02/24/21 while working in the yard he had an episode.  He became dizzy,  his vision went black and he had to lean against a tree for support.  Only lasted seconds he saw white and yellow spots which he assumes was the sun.  He was able to rest for a couple of minutes and go back to yard work.  He did mention that he was wearing a back brace snuggly and does not know if this contributed to his symptoms.  His BP ranges 113-120's SBP and 70's DBP, HR 60-80's, Os sat 95% and above.  He has had no further episodes.  He denies swelling, wt gain, cough, and CP.  I advised pt to call 911 if he has symptoms similar to what he described previously.  Will route to MD to address pt has severe AS.

## 2021-03-14 NOTE — Telephone Encounter (Signed)
Pt c/o Shortness Of Breath: STAT if SOB developed within the last 24 hours or pt is noticeably SOB on the phone  1. Are you currently SOB (can you hear that pt is SOB on the phone)? no  2. How long have you been experiencing SOB? Pretty recently.   3. Are you SOB when sitting or when up moving around? With activity (walking up the stairs, doing some yard work)  4. Are you currently experiencing any other symptoms? Fatigue    Patient states it is not severe like he needs to go to the hospital, but he wanted to come in to see Dr. Irish Lack sooner to possibly discuss surgery

## 2021-03-14 NOTE — Telephone Encounter (Signed)
Given severe AS and syncope, he will need further cardiac w/u with cath.    Stay well hydrated and be careful to avoid dehydration when working outside,  Try to avoid working outdoors when it very hot.  Jettie Booze, MD

## 2021-03-15 ENCOUNTER — Other Ambulatory Visit: Payer: Self-pay

## 2021-03-15 ENCOUNTER — Encounter: Payer: Self-pay | Admitting: Interventional Cardiology

## 2021-03-15 ENCOUNTER — Ambulatory Visit (INDEPENDENT_AMBULATORY_CARE_PROVIDER_SITE_OTHER): Payer: Medicare Other | Admitting: Interventional Cardiology

## 2021-03-15 VITALS — BP 140/80 | HR 69 | Ht 69.0 in | Wt 167.6 lb

## 2021-03-15 DIAGNOSIS — Z8249 Family history of ischemic heart disease and other diseases of the circulatory system: Secondary | ICD-10-CM | POA: Diagnosis not present

## 2021-03-15 DIAGNOSIS — Z8673 Personal history of transient ischemic attack (TIA), and cerebral infarction without residual deficits: Secondary | ICD-10-CM | POA: Diagnosis not present

## 2021-03-15 DIAGNOSIS — I350 Nonrheumatic aortic (valve) stenosis: Secondary | ICD-10-CM | POA: Diagnosis not present

## 2021-03-15 DIAGNOSIS — E782 Mixed hyperlipidemia: Secondary | ICD-10-CM | POA: Diagnosis not present

## 2021-03-15 NOTE — Progress Notes (Addendum)
Cardiology Office Note   Date:  03/15/2021   ID:  Nicholas Wise, DOB [birth year], MRN [id number]  PCP:  Nicholas Kaufmann, MD    No chief complaint on file.  Severe aortic stenosis  Wt Readings from Last 3 Encounters:  03/15/21 167 lb 9.6 oz (76 kg)  01/08/21 169 lb 9.6 oz (76.9 kg)  11/21/20 170 lb 13.7 oz (77.5 kg)       History of Present Illness: Nicholas Wise is a 75 y.o. male  who I saw for a murmur in 2019.   TIA in 2015. On Plavix since then.   Echo in 2019 showed: Left ventricle: The cavity size was normal. Wall thickness was   normal. Systolic function was normal. The estimated ejection   fraction was in the range of 60% to 65%. Wall motion was normal;   there were no regional wall motion abnormalities. Doppler   parameters are consistent with abnormal left ventricular   relaxation (grade 1 diastolic dysfunction). - Aortic valve: Trileaflet; severely thickened, severely calcified   leaflets. Valve mobility was restricted. There was moderate   stenosis. Peak velocity (S): 312 cm/s. Mean gradient (S): 22 mm   Hg. Peak gradient (S): 39 mm Hg. Valve area (Vmax): 1.24 cm^2.   He had his back surgery in 8/19.  This was successful.  In 2020, He went back to work.  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    In November 2020, he had knee replacement.  He did well with this.  No cardiac issues at the time.   Declined COVID vaccines in the past.   Echocardiogram in 2022 showed severe aortic stenosis.  A few months after the echo, he noted some lightheadedness and brief syncope.  He is seen today for further evaluation and to decide whether or not it is time for cardiac cath and possible aortic valve replacement.  He has had some DOE with walking up stairs.    He was picking up sticks in the yard and at one point wen he stood, his vision went black.  He leaned up against a tree.  He did not fully lose consciousness, because he could lean on  the tree.  If he did not have something to lean on, he thinks he would have fallen.  He was able to use a riding mower later that day.     Past Medical History:  Diagnosis Date   Anxiety    Aortic stenosis    Arthritis    Depression    Hemorrhoids    Hiatal hernia    High cholesterol    History of kidney stones    Hx of adenomatous polyp of colon 01/31/2017   Numbness and tingling in left hand    PONV (postoperative nausea and vomiting)    bladder spasms went home after neck surgery w/ catheter   Stroke ([place]) 11/09/2013   Tia    TIA (transient ischemic attack)    Urinary frequency     Past Surgical History:  Procedure Laterality Date   ANTERIOR CERVICAL DECOMP/DISCECTOMY FUSION N/A 04/11/2014   Procedure: ANTERIOR CERVICAL DECOMPRESSION/DISCECTOMY FUSION CERVICAL THREE-FOUR,CERVICAL FOUR-FIVE,CERVICAL FIVE-SIX;  Surgeon: Erline Levine, MD;  Location: [facility];  Service: Neurosurgery;  Laterality: N/A;   ANTERIOR LAT LUMBAR FUSION Left 02/20/2018   Procedure: Left Lumbar Four-Five Anterolateral lumbar interbody fusion;  Surgeon: Erline Levine, MD;  Location: [facility];  Service: Neurosurgery;  Laterality: Left;  BACK SURGERY     COLONOSCOPY     KNEE SURGERY Right    LUMBAR PERCUTANEOUS PEDICLE SCREW 1 LEVEL N/A 02/20/2018   Procedure: Lumbar Percutaneous Pedicle Screw Placment Lumbar four-five;  Surgeon: Erline Levine, MD;  Location: [place] [facility];  Service: Neurosurgery;  Laterality: N/A;   TONSILLECTOMY     TOTAL KNEE ARTHROPLASTY Right 06/08/2019   Procedure: RIGHT TOTAL KNEE ARTHROPLASTY;  Surgeon: Melrose Nakayama, MD;  [facility];  Service: Orthopedics;  Laterality: Right;   XI ROBOTIC ASSISTED INGUINAL HERNIA REPAIR WITH MESH Bilateral 11/21/2020   Procedure: ROBOTIC BILATERAL INGUINAL HERNIA REPAIR WITH MESH;  Surgeon: Ralene Ok, MD;  Location: [facility];  Service: General;  Laterality: Bilateral;     Current Outpatient Medications  Medication Sig Dispense Refill    acetaminophen (TYLENOL) 500 MG tablet Take 500 mg by mouth as needed for mild pain or headache.      allopurinol (ZYLOPRIM) 300 MG tablet Take 300 mg by mouth daily.     ALPRAZolam (XANAX) 0.5 MG tablet Take 0.25 mg by mouth 2 (two) times daily as needed for anxiety.     APPLE CIDER VINEGAR PO Take 1 each by mouth daily.     atorvastatin (LIPITOR) 40 MG tablet Take 40 mg by mouth every evening.      BLACK ELDERBERRY PO Take 1 each by mouth daily.     cholecalciferol (VITAMIN D3) 25 MCG (1000 UNIT) tablet Take 1,000 Units by mouth daily.     clopidogrel (PLAVIX) 75 MG tablet Take 1 tablet (75 mg total) by mouth daily.     finasteride (PROSCAR) 5 MG tablet Take 5 mg by mouth daily.     fluorouracil (EFUDEX) 5 % cream SMARTSIG:1 Topical Daily PRN     Polyethyl Glycol-Propyl Glycol (SYSTANE OP) Place 1 drop into both eyes daily.      potassium citrate (UROCIT-K) 10 MEQ (1080 MG) SR tablet Take 10 mEq by mouth 2 (two) times daily.     Saline (SIMPLY SALINE) 0.9 % AERS Place 1 spray into the nose every morning.     selenium 50 MCG TABS tablet Take 50 mcg by mouth daily.     sertraline (ZOLOFT) 100 MG tablet Take 100 mg by mouth at bedtime.     tamsulosin (FLOMAX) 0.4 MG CAPS capsule Take 0.4 mg by mouth 2 (two) times daily.     traMADol (ULTRAM) 50 MG tablet Take 1 tablet (50 mg total) by mouth every 6 (six) hours as needed. 20 tablet 0   triamcinolone (NASACORT) 55 MCG/ACT AERO nasal inhaler Place 1 spray into the nose daily.     zinc gluconate 50 MG tablet Take 50 mg by mouth daily.     No current facility-administered medications for this visit.    Allergies:   Patient has no known allergies.    Social History:  The patient  reports that he has never smoked. He has never used smokeless tobacco. He reports current alcohol use. He reports that he does not use drugs.   Family History:  The patient's family history includes Diabetes in his father; Heart disease in his father.    ROS:  Please  see the history of present illness.   Otherwise, review of systems are positive for near syncope.   All other systems are reviewed and negative.    PHYSICAL EXAM: VS:  BP 140/80   Pulse 69   Ht '5\' 9"'$  (1.753 m)   Wt 167 lb 9.6 oz (76 kg)  SpO2 95%   BMI 24.75 kg/m  , BMI Body mass index is 24.75 kg/m. GEN: Well nourished, well developed, in no acute distress HEENT: normal Neck: no JVD, carotid bruits, or masses Cardiac: RRR; 3/6 systolic murmur; no rubs, or gallops,no edema  Respiratory:  clear to auscultation bilaterally, normal work of breathing GI: soft, nontender, nondistended, + BS MS: no deformity or atrophy Skin: warm and dry, no rash Neuro:  Strength and sensation are intact Psych: euthymic mood, full affect   EKG:   The ekg ordered today demonstrates NSR, RBBB, no ST changes   Recent Labs: 11/17/2020: BUN 18; Creatinine, Ser 0.67; Hemoglobin 13.2; Platelets 168; Potassium 4.4; Sodium 136   Lipid Panel No results found for: CHOL, TRIG, HDL, CHOLHDL, VLDL, LDLCALC, LDLDIRECT   Other studies Reviewed: Additional studies/ records that were reviewed today with results demonstrating: prior echo result reviewed.   ASSESSMENT AND PLAN:  Severe aortic stenosis: Near syncope episode while doing some light yard work. Plan for left and right heart cath.  Will plan or CT surgery eval with Dr. Cyndia Bent after the cath.   Hyperlipidemia: Continue atorvastatin.  Prior TIA: On clopidogrel.  Family history of CAD: No angina.   The patient understands that risks include but are not limited to stroke (1 in 1000), death (1 in 71), kidney failure [usually temporary] (1 in 500), bleeding (1 in 200), allergic reaction [possibly serious] (1 in 200), and agrees to proceed.   Current medicines are reviewed at length with the patient today.  The patient concerns regarding his medicines were addressed.  The following changes have been made:  No change  Labs/ tests ordered today include:   No orders of the defined types were placed in this encounter.   Recommend 150 minutes/week of aerobic exercise Low fat, low carb, high fiber diet recommended  Disposition:   FU for cath   Signed, Larae Grooms, MD  03/15/2021 12:14 PM    [facility] [place], [place], [place]  [address] Phone: [phone]; Fax: [phone]

## 2021-03-15 NOTE — Telephone Encounter (Signed)
Dr Irish Lack can see patient today at noon. I placed call to patient and left message on home and cell numbers to call office.

## 2021-03-15 NOTE — Patient Instructions (Addendum)
Medication Instructions:  Your physician recommends that you continue on your current medications as directed. Please refer to the Current Medication list given to you today.  *If you need a refill on your cardiac medications before your next appointment, please call your pharmacy*   Lab Work: Lab work to be done today--BMP and CBC If you have labs (blood work) drawn today and your tests are completely normal, you will receive your results only by: [place] (if you have MyChart) OR A paper copy in the mail If you have any lab test that is abnormal or we need to change your treatment, we will call you to review the results.   Testing/Procedures: Your physician has requested that you have a cardiac catheterization. Cardiac catheterization is used to diagnose and/or treat various heart conditions. Doctors may recommend this procedure for a number of different reasons. The most common reason is to evaluate chest pain. Chest pain can be a symptom of coronary artery disease (CAD), and cardiac catheterization can show whether plaque is narrowing or blocking your heart's arteries. This procedure is also used to evaluate the valves, as well as measure the blood flow and oxygen levels in different parts of your heart. For further information please visit [email or web address]. Please follow instruction sheet, as given. Scheduled for September 13,2022   Follow-Up: At [facility], you and your health needs are our priority.  As part of our continuing mission to provide you with exceptional heart care, we have created designated Provider Care Teams.  These Care Teams include your primary Cardiologist (physician) and Advanced Practice Providers (APPs -  Physician Assistants and Nurse Practitioners) who all work together to provide you with the care you need, when you need it.  We recommend signing up for the patient portal called "MyChart".  Sign up information is provided on this After Visit  Summary.  MyChart is used to connect with patients for Virtual Visits (Telemedicine).  Patients are able to view lab/test results, encounter notes, upcoming appointments, etc.  Non-urgent messages can be sent to your provider as well.   To learn more about what you can do with MyChart, go to [email or web address].    Your next appointment:   Based on results  The format for your next appointment:   In Person  Provider:   You may see Larae Grooms, MD or one of the following Advanced Practice Providers on your designated Care Team:   Melina Copa, PA-C Ermalinda Barrios, PA-C   Other Instructions   [facility] OFFICE [place], [place] [address] Dept: [phone] Loc: [place]  03/15/2021  You are scheduled for a Cardiac Catheterization on Tuesday, September 13 with Dr. Larae Grooms.  1. Please arrive at the [facility] (Main Entrance A) at [facility]: [address] [place], [place] [address] at 7:00 AM (This time is two hours before your procedure to ensure your preparation). Free valet parking service is available.   Special note: Every effort is made to have your procedure done on time. Please understand that emergencies sometimes delay scheduled procedures.  2. Diet: Do not eat solid foods after midnight.  The patient may have clear liquids until 5am upon the day of the procedure.  3. Labs: done in office on 03/15/21  4. Medication instructions in preparation for your procedure:   Contrast Allergy: No    On the morning of your procedure, take  Aspirin  81 mg and your Clopidogrel and any morning medicines NOT listed above.  You may use sips of water.  5. Plan for one night stay--bring personal belongings. 6. Bring a current list of your medications and current insurance cards. 7. You MUST have a responsible person to drive you home. 8.  Someone MUST be with you the first 24 hours after you arrive home or your discharge will be delayed. 9. Please wear clothes that are easy to get on and off and wear slip-on shoes.  Thank you for allowing [place] to care for you!   -- [facility]

## 2021-03-15 NOTE — H&P (View-Only) (Signed)
Cardiology Office Note   Date:  03/15/2021   ID:  Nicholas Wise, DOB [birth year], MRN [id number]  PCP:  Josem Kaufmann, MD    No chief complaint on file.  Severe aortic stenosis  Wt Readings from Last 3 Encounters:  03/15/21 167 lb 9.6 oz (76 kg)  01/08/21 169 lb 9.6 oz (76.9 kg)  11/21/20 170 lb 13.7 oz (77.5 kg)       History of Present Illness: Nicholas Wise is a 75 y.o. male  who I saw for a murmur in 2019.   TIA in 2015. On Plavix since then.   Echo in 2019 showed: Left ventricle: The cavity size was normal. Wall thickness was   normal. Systolic function was normal. The estimated ejection   fraction was in the range of 60% to 65%. Wall motion was normal;   there were no regional wall motion abnormalities. Doppler   parameters are consistent with abnormal left ventricular   relaxation (grade 1 diastolic dysfunction). - Aortic valve: Trileaflet; severely thickened, severely calcified   leaflets. Valve mobility was restricted. There was moderate   stenosis. Peak velocity (S): 312 cm/s. Mean gradient (S): 22 mm   Hg. Peak gradient (S): 39 mm Hg. Valve area (Vmax): 1.24 cm^2.   He had his back surgery in 8/19.  This was successful.  In 2020, He went back to work.  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    In November 2020, he had knee replacement.  He did well with this.  No cardiac issues at the time.   Declined COVID vaccines in the past.   Echocardiogram in 2022 showed severe aortic stenosis.  A few months after the echo, he noted some lightheadedness and brief syncope.  He is seen today for further evaluation and to decide whether or not it is time for cardiac cath and possible aortic valve replacement.  He has had some DOE with walking up stairs.    He was picking up sticks in the yard and at one point wen he stood, his vision went black.  He leaned up against a tree.  He did not fully lose consciousness, because he could lean on  the tree.  If he did not have something to lean on, he thinks he would have fallen.  He was able to use a riding mower later that day.     Past Medical History:  Diagnosis Date   Anxiety    Aortic stenosis    Arthritis    Depression    Hemorrhoids    Hiatal hernia    High cholesterol    History of kidney stones    Hx of adenomatous polyp of colon 01/31/2017   Numbness and tingling in left hand    PONV (postoperative nausea and vomiting)    bladder spasms went home after neck surgery w/ catheter   Stroke ([place]) 11/09/2013   Tia    TIA (transient ischemic attack)    Urinary frequency     Past Surgical History:  Procedure Laterality Date   ANTERIOR CERVICAL DECOMP/DISCECTOMY FUSION N/A 04/11/2014   Procedure: ANTERIOR CERVICAL DECOMPRESSION/DISCECTOMY FUSION CERVICAL THREE-FOUR,CERVICAL FOUR-FIVE,CERVICAL FIVE-SIX;  Surgeon: Erline Levine, MD;  Location: [place];  Service: Neurosurgery;  Laterality: N/A;   ANTERIOR LAT LUMBAR FUSION Left 02/20/2018   Procedure: Left Lumbar Four-Five Anterolateral lumbar interbody fusion;  Surgeon: Erline Levine, MD;  Location: [facility];  Service: Neurosurgery;  Laterality: Left;  BACK SURGERY     COLONOSCOPY     KNEE SURGERY Right    LUMBAR PERCUTANEOUS PEDICLE SCREW 1 LEVEL N/A 02/20/2018   Procedure: Lumbar Percutaneous Pedicle Screw Placment Lumbar four-five;  Surgeon: Erline Levine, MD;  Location: [facility];  Service: Neurosurgery;  Laterality: N/A;   TONSILLECTOMY     TOTAL KNEE ARTHROPLASTY Right 06/08/2019   Procedure: RIGHT TOTAL KNEE ARTHROPLASTY;  Surgeon: Melrose Nakayama, MD;  [facility];  Service: Orthopedics;  Laterality: Right;   XI ROBOTIC ASSISTED INGUINAL HERNIA REPAIR WITH MESH Bilateral 11/21/2020   Procedure: ROBOTIC BILATERAL INGUINAL HERNIA REPAIR WITH MESH;  Surgeon: Ralene Ok, MD;  Location: [facility];  Service: General;  Laterality: Bilateral;     Current Outpatient Medications  Medication Sig Dispense Refill    acetaminophen (TYLENOL) 500 MG tablet Take 500 mg by mouth as needed for mild pain or headache.      allopurinol (ZYLOPRIM) 300 MG tablet Take 300 mg by mouth daily.     ALPRAZolam (XANAX) 0.5 MG tablet Take 0.25 mg by mouth 2 (two) times daily as needed for anxiety.     APPLE CIDER VINEGAR PO Take 1 each by mouth daily.     atorvastatin (LIPITOR) 40 MG tablet Take 40 mg by mouth every evening.      BLACK ELDERBERRY PO Take 1 each by mouth daily.     cholecalciferol (VITAMIN D3) 25 MCG (1000 UNIT) tablet Take 1,000 Units by mouth daily.     clopidogrel (PLAVIX) 75 MG tablet Take 1 tablet (75 mg total) by mouth daily.     finasteride (PROSCAR) 5 MG tablet Take 5 mg by mouth daily.     fluorouracil (EFUDEX) 5 % cream SMARTSIG:1 Topical Daily PRN     Polyethyl Glycol-Propyl Glycol (SYSTANE OP) Place 1 drop into both eyes daily.      potassium citrate (UROCIT-K) 10 MEQ (1080 MG) SR tablet Take 10 mEq by mouth 2 (two) times daily.     Saline (SIMPLY SALINE) 0.9 % AERS Place 1 spray into the nose every morning.     selenium 50 MCG TABS tablet Take 50 mcg by mouth daily.     sertraline (ZOLOFT) 100 MG tablet Take 100 mg by mouth at bedtime.     tamsulosin (FLOMAX) 0.4 MG CAPS capsule Take 0.4 mg by mouth 2 (two) times daily.     traMADol (ULTRAM) 50 MG tablet Take 1 tablet (50 mg total) by mouth every 6 (six) hours as needed. 20 tablet 0   triamcinolone (NASACORT) 55 MCG/ACT AERO nasal inhaler Place 1 spray into the nose daily.     zinc gluconate 50 MG tablet Take 50 mg by mouth daily.     No current facility-administered medications for this visit.    Allergies:   Patient has no known allergies.    Social History:  The patient  reports that he has never smoked. He has never used smokeless tobacco. He reports current alcohol use. He reports that he does not use drugs.   Family History:  The patient's family history includes Diabetes in his father; Heart disease in his father.    ROS:  Please  see the history of present illness.   Otherwise, review of systems are positive for near syncope.   All other systems are reviewed and negative.    PHYSICAL EXAM: VS:  BP 140/80   Pulse 69   Ht '5\' 9"'$  (1.753 m)   Wt 167 lb 9.6 oz (76 kg)  SpO2 95%   BMI 24.75 kg/m  , BMI Body mass index is 24.75 kg/m. GEN: Well nourished, well developed, in no acute distress HEENT: normal Neck: no JVD, carotid bruits, or masses Cardiac: RRR; 3/6 systolic murmur; no rubs, or gallops,no edema  Respiratory:  clear to auscultation bilaterally, normal work of breathing GI: soft, nontender, nondistended, + BS MS: no deformity or atrophy Skin: warm and dry, no rash Neuro:  Strength and sensation are intact Psych: euthymic mood, full affect   EKG:   The ekg ordered today demonstrates NSR, RBBB, no ST changes   Recent Labs: 11/17/2020: BUN 18; Creatinine, Ser 0.67; Hemoglobin 13.2; Platelets 168; Potassium 4.4; Sodium 136   Lipid Panel No results found for: CHOL, TRIG, HDL, CHOLHDL, VLDL, LDLCALC, LDLDIRECT   Other studies Reviewed: Additional studies/ records that were reviewed today with results demonstrating: prior echo result reviewed.   ASSESSMENT AND PLAN:  Severe aortic stenosis: Near syncope episode while doing some light yard work. Plan for left and right heart cath.  Will plan or CT surgery eval with Dr. Cyndia Bent after the cath.   Hyperlipidemia: Continue atorvastatin.  Prior TIA: On clopidogrel.  Family history of CAD: No angina.   The patient understands that risks include but are not limited to stroke (1 in 1000), death (1 in 62), kidney failure [usually temporary] (1 in 500), bleeding (1 in 200), allergic reaction [possibly serious] (1 in 200), and agrees to proceed.   Current medicines are reviewed at length with the patient today.  The patient concerns regarding his medicines were addressed.  The following changes have been made:  No change  Labs/ tests ordered today include:   No orders of the defined types were placed in this encounter.   Recommend 150 minutes/week of aerobic exercise Low fat, low carb, high fiber diet recommended  Disposition:   FU for cath   Signed, Larae Grooms, MD  03/15/2021 12:14 PM    [facility] [place], [place], [place]  [address] Phone: [phone]; Fax: [phone]

## 2021-03-15 NOTE — Telephone Encounter (Signed)
I spoke with patient and he will come in for appointment at noon today

## 2021-03-15 NOTE — Telephone Encounter (Signed)
Patient returning call.

## 2021-03-16 LAB — BASIC METABOLIC PANEL
BUN/Creatinine Ratio: 23 (ref 10–24)
BUN: 14 mg/dL (ref 8–27)
CO2: 27 mmol/L (ref 20–29)
Calcium: 9.3 mg/dL (ref 8.6–10.2)
Chloride: 102 mmol/L (ref 96–106)
Creatinine, Ser: 0.61 mg/dL — ABNORMAL LOW (ref 0.76–1.27)
Glucose: 99 mg/dL (ref 65–99)
Potassium: 5.1 mmol/L (ref 3.5–5.2)
Sodium: 142 mmol/L (ref 134–144)
eGFR: 101 mL/min/{1.73_m2} (ref >59)

## 2021-03-16 LAB — CBC
Hematocrit: 40 % (ref 37.5–51.0)
Hemoglobin: 13.2 g/dL (ref 13.0–17.7)
MCH: 31.3 pg (ref 26.6–33.0)
MCHC: 33 g/dL (ref 31.5–35.7)
MCV: 95 fL (ref 79–97)
Platelets: 195 10*3/uL (ref 150–450)
RBC: 4.22 x10E6/uL (ref 4.14–5.80)
RDW: 13.2 % (ref 11.6–15.4)
WBC: 5.5 10*3/uL (ref 3.4–10.8)

## 2021-03-26 ENCOUNTER — Telehealth: Payer: Self-pay | Admitting: *Deleted

## 2021-03-26 NOTE — Telephone Encounter (Signed)
Cardiac catheterization scheduled at [facility] for: Tuesday March 27, 2021 [facility] Main Entrance A [facility]) at: 7 AM   No solid food after midnight prior to cath, clear liquids until 5 AM day of procedure.   Morning medications can be taken pre-cath with sips of water including: -aspirin 81 mg -Plavix 75 mg    Confirmed patient has responsible adult to drive home post procedure and be with patient first 24 hours after arriving home.  Patients are allowed one visitor in the waiting room during the time they are at the hospital for their procedure. Both patient and visitor must wear a mask once they enter the hospital.   Patient reports does not currently have any symptoms concerning for COVID-19 and no household members with COVID-19 like illness.      Reviewed procedure/mask/visitor instructions with patient.

## 2021-03-27 ENCOUNTER — Ambulatory Visit (HOSPITAL_COMMUNITY)
Admission: RE | Admit: 2021-03-27 | Discharge: 2021-03-27 | Disposition: A | Discharge status: Home / Self Care | Payer: Medicare Other | Attending: Interventional Cardiology | Admitting: Interventional Cardiology

## 2021-03-27 ENCOUNTER — Encounter (HOSPITAL_COMMUNITY): Payer: Self-pay | Admitting: Interventional Cardiology

## 2021-03-27 ENCOUNTER — Other Ambulatory Visit: Payer: Self-pay

## 2021-03-27 ENCOUNTER — Encounter (HOSPITAL_COMMUNITY)
Admission: RE | Disposition: A | Discharge status: Home / Self Care | Payer: Self-pay | Source: Home / Self Care | Attending: Interventional Cardiology

## 2021-03-27 DIAGNOSIS — Z7902 Long term (current) use of antithrombotics/antiplatelets: Secondary | ICD-10-CM | POA: Diagnosis not present

## 2021-03-27 DIAGNOSIS — I2510 Atherosclerotic heart disease of native coronary artery without angina pectoris: Secondary | ICD-10-CM | POA: Diagnosis not present

## 2021-03-27 DIAGNOSIS — Z8673 Personal history of transient ischemic attack (TIA), and cerebral infarction without residual deficits: Secondary | ICD-10-CM | POA: Diagnosis not present

## 2021-03-27 DIAGNOSIS — Z8249 Family history of ischemic heart disease and other diseases of the circulatory system: Secondary | ICD-10-CM | POA: Insufficient documentation

## 2021-03-27 DIAGNOSIS — I350 Nonrheumatic aortic (valve) stenosis: Secondary | ICD-10-CM

## 2021-03-27 DIAGNOSIS — E785 Hyperlipidemia, unspecified: Secondary | ICD-10-CM | POA: Diagnosis not present

## 2021-03-27 DIAGNOSIS — Z79899 Other long term (current) drug therapy: Secondary | ICD-10-CM | POA: Insufficient documentation

## 2021-03-27 HISTORY — PX: RIGHT HEART CATH AND CORONARY ANGIOGRAPHY: CATH118264

## 2021-03-27 LAB — POCT I-STAT EG7
Acid-Base Excess: 2 mmol/L (ref 0.0–2.0)
Acid-Base Excess: 3 mmol/L — ABNORMAL HIGH (ref 0.0–2.0)
Bicarbonate: 28.4 mmol/L — ABNORMAL HIGH (ref 20.0–28.0)
Bicarbonate: 29.2 mmol/L — ABNORMAL HIGH (ref 20.0–28.0)
Calcium, Ion: 1.22 mmol/L (ref 1.15–1.40)
Calcium, Ion: 1.22 mmol/L (ref 1.15–1.40)
HCT: 37 % — ABNORMAL LOW (ref 39.0–52.0)
HCT: 37 % — ABNORMAL LOW (ref 39.0–52.0)
Hemoglobin: 12.6 g/dL — ABNORMAL LOW (ref 13.0–17.0)
Hemoglobin: 12.6 g/dL — ABNORMAL LOW (ref 13.0–17.0)
O2 Saturation: 70 %
O2 Saturation: 71 %
Potassium: 4.4 mmol/L (ref 3.5–5.1)
Potassium: 4.4 mmol/L (ref 3.5–5.1)
Sodium: 142 mmol/L (ref 135–145)
Sodium: 142 mmol/L (ref 135–145)
TCO2: 30 mmol/L (ref 22–32)
TCO2: 31 mmol/L (ref 22–32)
pCO2, Ven: 50.5 mmHg (ref 44.0–60.0)
pCO2, Ven: 52.1 mmHg (ref 44.0–60.0)
pH, Ven: 7.357 (ref 7.250–7.430)
pH, Ven: 7.357 (ref 7.250–7.430)
pO2, Ven: 39 mmHg (ref 32.0–45.0)
pO2, Ven: 39 mmHg (ref 32.0–45.0)

## 2021-03-27 LAB — POCT I-STAT 7, (LYTES, BLD GAS, ICA,H+H)
Acid-Base Excess: 1 mmol/L (ref 0.0–2.0)
Bicarbonate: 27.5 mmol/L (ref 20.0–28.0)
Calcium, Ion: 1.22 mmol/L (ref 1.15–1.40)
HCT: 37 % — ABNORMAL LOW (ref 39.0–52.0)
Hemoglobin: 12.6 g/dL — ABNORMAL LOW (ref 13.0–17.0)
O2 Saturation: 92 %
Potassium: 4.4 mmol/L (ref 3.5–5.1)
Sodium: 141 mmol/L (ref 135–145)
TCO2: 29 mmol/L (ref 22–32)
pCO2 arterial: 48.6 mmHg — ABNORMAL HIGH (ref 32.0–48.0)
pH, Arterial: 7.361 (ref 7.350–7.450)
pO2, Arterial: 66 mmHg — ABNORMAL LOW (ref 83.0–108.0)

## 2021-03-27 SURGERY — RIGHT HEART CATH AND CORONARY ANGIOGRAPHY
Anesthesia: LOCAL

## 2021-03-27 MED ORDER — ASPIRIN EC 81 MG PO TBEC: 81.0000 mg | DELAYED_RELEASE_TABLET | Freq: Every day | ORAL | 11 refills | Status: DC

## 2021-03-27 MED ORDER — CLOPIDOGREL BISULFATE 75 MG PO TABS: 75.0000 mg | ORAL_TABLET | ORAL | Status: DC

## 2021-03-27 MED ORDER — MIDAZOLAM HCL 2 MG/2ML IJ SOLN
INTRAMUSCULAR | Status: AC
  Filled 2021-03-27: qty 2

## 2021-03-27 MED ORDER — IOHEXOL 350 MG/ML SOLN
INTRAVENOUS | Status: DC | PRN
  Administered 2021-03-27: 60 mL

## 2021-03-27 MED ORDER — HEPARIN SODIUM (PORCINE) 1000 UNIT/ML IJ SOLN
INTRAMUSCULAR | Status: DC | PRN
  Administered 2021-03-27: 4000 [IU] via INTRAVENOUS

## 2021-03-27 MED ORDER — HEPARIN SODIUM (PORCINE) 1000 UNIT/ML IJ SOLN
INTRAMUSCULAR | Status: AC
  Filled 2021-03-27: qty 1

## 2021-03-27 MED ORDER — ACETAMINOPHEN 325 MG PO TABS: 650.0000 mg | ORAL_TABLET | ORAL | Status: DC | PRN

## 2021-03-27 MED ORDER — SODIUM CHLORIDE 0.9 % IV SOLN: 250.0000 mL | INTRAVENOUS | Status: DC | PRN

## 2021-03-27 MED ORDER — LIDOCAINE HCL (PF) 1 % IJ SOLN
INTRAMUSCULAR | Status: DC | PRN
  Administered 2021-03-27 (×2): 2 mL

## 2021-03-27 MED ORDER — SODIUM CHLORIDE 0.9% FLUSH: 3.0000 mL | INTRAVENOUS | Status: DC | PRN

## 2021-03-27 MED ORDER — ASPIRIN 81 MG PO CHEW: 81.0000 mg | CHEWABLE_TABLET | ORAL | Status: DC

## 2021-03-27 MED ORDER — FENTANYL CITRATE (PF) 100 MCG/2ML IJ SOLN
INTRAMUSCULAR | Status: DC | PRN
  Administered 2021-03-27: 25 ug via INTRAVENOUS

## 2021-03-27 MED ORDER — SODIUM CHLORIDE 0.9 % WEIGHT BASED INFUSION: 1.0000 mL/kg/h | INTRAVENOUS | Status: DC

## 2021-03-27 MED ORDER — SODIUM CHLORIDE 0.9 % WEIGHT BASED INFUSION
3.0000 mL/kg/h | INTRAVENOUS | Status: AC
  Administered 2021-03-27: 3 mL/kg/h via INTRAVENOUS

## 2021-03-27 MED ORDER — LIDOCAINE HCL (PF) 1 % IJ SOLN
INTRAMUSCULAR | Status: AC
  Filled 2021-03-27: qty 30

## 2021-03-27 MED ORDER — LABETALOL HCL 5 MG/ML IV SOLN: 10.0000 mg | INTRAVENOUS | Status: DC | PRN

## 2021-03-27 MED ORDER — VERAPAMIL HCL 2.5 MG/ML IV SOLN
INTRAVENOUS | Status: AC
  Filled 2021-03-27: qty 2

## 2021-03-27 MED ORDER — HEPARIN (PORCINE) IN NACL 1000-0.9 UT/500ML-% IV SOLN
INTRAVENOUS | Status: DC | PRN
  Administered 2021-03-27 (×2): 500 mL

## 2021-03-27 MED ORDER — HYDRALAZINE HCL 20 MG/ML IJ SOLN: 10.0000 mg | INTRAMUSCULAR | Status: DC | PRN

## 2021-03-27 MED ORDER — FENTANYL CITRATE (PF) 100 MCG/2ML IJ SOLN
INTRAMUSCULAR | Status: AC
  Filled 2021-03-27: qty 2

## 2021-03-27 MED ORDER — VERAPAMIL HCL 2.5 MG/ML IV SOLN
INTRAVENOUS | Status: DC | PRN
  Administered 2021-03-27: 10 mL via INTRA_ARTERIAL

## 2021-03-27 MED ORDER — SODIUM CHLORIDE 0.9% FLUSH: 3.0000 mL | Freq: Two times a day (BID) | INTRAVENOUS | Status: DC

## 2021-03-27 MED ORDER — MIDAZOLAM HCL 2 MG/2ML IJ SOLN
INTRAMUSCULAR | Status: DC | PRN
  Administered 2021-03-27: 1 mg via INTRAVENOUS

## 2021-03-27 MED ORDER — HEPARIN (PORCINE) IN NACL 1000-0.9 UT/500ML-% IV SOLN
INTRAVENOUS | Status: AC
  Filled 2021-03-27: qty 1000

## 2021-03-27 MED ORDER — ONDANSETRON HCL 4 MG/2ML IJ SOLN: 4.0000 mg | Freq: Four times a day (QID) | INTRAMUSCULAR | Status: DC | PRN

## 2021-03-27 MED ORDER — SODIUM CHLORIDE 0.9 % IV SOLN: INTRAVENOUS | Status: AC

## 2021-03-27 SURGICAL SUPPLY — 15 items
CATH 5FR JL3.5 JR4 ANG PIG MP (CATHETERS) ×2 IMPLANT
CATH BALLN WEDGE 5F 110CM (CATHETERS) ×2 IMPLANT
CATH INFINITI 5FR AL1 (CATHETERS) ×2 IMPLANT
DEVICE RAD COMP TR BAND LRG (VASCULAR PRODUCTS) ×4 IMPLANT
GLIDESHEATH SLEND SS 6F .021 (SHEATH) ×2 IMPLANT
GUIDEWIRE .025 260CM (WIRE) ×2 IMPLANT
GUIDEWIRE INQWIRE 1.5J.035X260 (WIRE) ×1 IMPLANT
INQWIRE 1.5J .035X260CM (WIRE) ×2
KIT HEART LEFT (KITS) ×4 IMPLANT
PACK CARDIAC CATHETERIZATION (CUSTOM PROCEDURE TRAY) ×4 IMPLANT
SHEATH GLIDE SLENDER 4/5FR (SHEATH) ×2 IMPLANT
SYR MEDRAD MARK 7 150ML (SYRINGE) ×2 IMPLANT
TRANSDUCER W/STOPCOCK (MISCELLANEOUS) ×4 IMPLANT
TUBING CIL FLEX 10 FLL-RA (TUBING) ×4 IMPLANT
WIRE EMERALD ST .035X150CM (WIRE) ×2 IMPLANT

## 2021-03-27 NOTE — Progress Notes (Signed)
Pt ambulated without difficulty or bleeding.   Discharged home with his wife who will drive and stay with pt x 24 hrs. 

## 2021-03-27 NOTE — Discharge Instructions (Signed)
Radial Site Care  This sheet gives you information about how to care for yourself after your procedure. Your health care provider may also give you more specific instructions. If you have problems or questions, contact your health care provider. What can I expect after the procedure? After the procedure, it is common to have: Bruising and tenderness at the catheter insertion area. Follow these instructions at home: Medicines Take over-the-counter and prescription medicines only as told by your health care provider. Insertion site care Follow instructions from your health care provider about how to take care of your insertion site. Make sure you: Wash your hands with soap and water before you remove your bandage (dressing). If soap and water are not available, use hand sanitizer. May remove dressing in 24 hours. Check your insertion site every day for signs of infection. Check for: Redness, swelling, or pain. Fluid or blood. Pus or a bad smell. Warmth. Do no take baths, swim, or use a hot tub for 5 days. You may shower 24-48 hours after the procedure. Remove the dressing and gently wash the site with plain soap and water. Pat the area dry with a clean towel. Do not rub the site. That could cause bleeding. Do not apply powder or lotion to the site. Activity  For 24 hours after the procedure, or as directed by your health care provider: Do not flex or bend the affected arm. Do not push or pull heavy objects with the affected arm. Do not drive yourself home from the hospital or clinic. You may drive 24 hours after the procedure. Do not operate machinery or power tools. KEEP ARM ELEVATED THE REMAINDER OF THE DAY. Do not push, pull or lift anything that is heavier than 10 lb for 5 days. Ask your health care provider when it is okay to: Return to work or school. Resume usual physical activities or sports. Resume sexual activity. General instructions If the catheter site starts to  bleed, raise your arm and put firm pressure on the site. If the bleeding does not stop, get help right away. This is a medical emergency. DRINK PLENTY OF FLUIDS FOR THE NEXT 2-3 DAYS. No alcohol consumption for 24 hours after receiving sedation. If you went home on the same day as your procedure, a responsible adult should be with you for the first 24 hours after you arrive home. Keep all follow-up visits as told by your health care provider. This is important. Contact a health care provider if: You have a fever. You have redness, swelling, or yellow drainage around your insertion site. Get help right away if: You have unusual pain at the radial site. The catheter insertion area swells very fast. The insertion area is bleeding, and the bleeding does not stop when you hold steady pressure on the area. Your arm or hand becomes pale, cool, tingly, or numb. These symptoms may represent a serious problem that is an emergency. Do not wait to see if the symptoms will go away. Get medical help right away. Call your local emergency services (911 in the [place] Do not drive yourself to the hospital. Summary After the procedure, it is common to have bruising and tenderness at the site. Follow instructions from your health care provider about how to take care of your radial site wound. Check the wound every day for signs of infection.  This information is not intended to replace advice given to you by your health care provider. Make sure you discuss any questions you have with   your health care provider. Document Revised: 08/06/2017 Document Reviewed: 08/06/2017 Elsevier Patient Education  2020 [employer]  

## 2021-03-27 NOTE — Research (Signed)
Identify Informed Consent   Subject Name: Nicholas Wise [facility]  Subject met inclusion and exclusion criteria.  The informed consent form, study requirements and expectations were reviewed with the subject and questions and concerns were addressed prior to the signing of the consent form.  The subject verbalized understanding of the trial requirements.  The subject agreed to participate in the Identify trial and signed the informed consent at [place] on 03/27/2021.  The informed consent was obtained prior to performance of any protocol-specific procedures for the subject.  A copy of the signed informed consent was given to the subject and a copy was placed in the subject's medical record.   Nicholas Wise

## 2021-03-27 NOTE — Interval H&P Note (Signed)
History and Physical Interval Note:Cath Lab Visit (complete for each Cath Lab visit)  Clinical Evaluation Leading to the Procedure:   ACS: No.  Non-ACS:    Anginal Classification: CCS III  Anti-ischemic medical therapy: Minimal Therapy (1 class of medications)  Non-Invasive Test Results: High-risk stress test findings: cardiac mortality >3%/year  Prior CABG: No previous CABG   Severe AS with syncope.  Pre intervention cath.      03/27/2021 8:52 AM  Nicholas Wise  has presented today for surgery, with the diagnosis of aortic stenosis.  The various methods of treatment have been discussed with the patient and family. After consideration of risks, benefits and other options for treatment, the patient has consented to  Procedure(s): RIGHT/LEFT HEART CATH AND CORONARY ANGIOGRAPHY (N/A) as a surgical intervention.  The patient's history has been reviewed, patient examined, no change in status, stable for surgery.  I have reviewed the patient's chart and labs.  Questions were answered to the patient's satisfaction.     Larae Grooms

## 2021-03-27 NOTE — Progress Notes (Signed)
[facility] consulted for outpatient CABG/AVR evaluation. [facility] office will call patient with an appointment.

## 2021-03-28 ENCOUNTER — Telehealth: Payer: Self-pay | Admitting: Interventional Cardiology

## 2021-03-28 DIAGNOSIS — I2510 Atherosclerotic heart disease of native coronary artery without angina pectoris: Secondary | ICD-10-CM

## 2021-03-28 DIAGNOSIS — I350 Nonrheumatic aortic (valve) stenosis: Secondary | ICD-10-CM

## 2021-03-28 NOTE — Telephone Encounter (Signed)
Pt would like for Dr. Irish Lack to refer him to Dr. Burt Knack for surgery. Please advise pt further

## 2021-03-28 NOTE — Telephone Encounter (Signed)
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 Cyndia Bent   Patient notified.  He is aware [facility] will contact him with appointment information.

## 2021-04-04 ENCOUNTER — Encounter: Payer: Self-pay | Admitting: Surgery

## 2021-04-04 ENCOUNTER — Encounter: Payer: Self-pay | Admitting: *Deleted

## 2021-04-04 ENCOUNTER — Other Ambulatory Visit: Payer: Self-pay

## 2021-04-04 ENCOUNTER — Other Ambulatory Visit: Payer: Self-pay | Admitting: *Deleted

## 2021-04-04 ENCOUNTER — Institutional Professional Consult (permissible substitution) (INDEPENDENT_AMBULATORY_CARE_PROVIDER_SITE_OTHER): Payer: Medicare Other | Admitting: Surgery

## 2021-04-04 VITALS — BP 119/60 | HR 90 | Resp 20 | Ht 69.0 in | Wt 167.0 lb

## 2021-04-04 DIAGNOSIS — I21A9 Other myocardial infarction type: Secondary | ICD-10-CM

## 2021-04-04 DIAGNOSIS — I350 Nonrheumatic aortic (valve) stenosis: Secondary | ICD-10-CM

## 2021-04-04 DIAGNOSIS — Z8673 Personal history of transient ischemic attack (TIA), and cerebral infarction without residual deficits: Secondary | ICD-10-CM

## 2021-04-04 DIAGNOSIS — I2510 Atherosclerotic heart disease of native coronary artery without angina pectoris: Secondary | ICD-10-CM

## 2021-04-04 DIAGNOSIS — Z5181 Encounter for therapeutic drug level monitoring: Secondary | ICD-10-CM

## 2021-04-04 NOTE — Progress Notes (Signed)
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  He reports having some shortness of breath with going up stairs or walking up hills for the past several months but remains fairly active.  He really did not think very much about the symptoms.  He underwent repeat echocardiogram on 01/08/2021 showing a calcified and thickened aortic valve with restricted leaflet mobility.  The mean gradient had increased to 40 mmHg compared to his prior echo in 2019 with trivial regurgitation.  Aortic valve area was 0.66 cm.  Left ventricular ejection fraction was 60 to 65%. He had an episode about a month ago where he was picking up sticks in the yard and at one point when he stood up his vision suddenly went black and he leaned against a tree and got himself to the ground.  He did not lose consciousness and his vision came back after a short period of time.  He has had no further episodes like that.  This episode prompted further work-up and he underwent cardiac catheterization on 03/27/2021.  This showed a 75% proximal to mid LAD stenosis and a 90% mid RCA stenosis.  There is about 50% diagonal stenosis.  Right heart pressures were within normal limits.  He is here today with his wife.  He continues to remain fairly active and has been riding a lawnmower a few days per week.  He sees his dentist regularly and has had no dental problems. Past  Medical History:  Diagnosis Date   Anxiety    Aortic stenosis    Arthritis    Depression    Hemorrhoids    Hiatal hernia    High cholesterol    History of kidney stones    Hx of adenomatous polyp of colon 01/31/2017   Numbness and tingling in left hand    PONV (postoperative nausea and vomiting)    bladder spasms went home after neck surgery w/ catheter   Stroke ([place]) 11/09/2013   Tia    TIA (transient ischemic attack)    Urinary frequency     Past Surgical History:  Procedure Laterality Date   ANTERIOR CERVICAL DECOMP/DISCECTOMY FUSION N/A 04/11/2014   Procedure: ANTERIOR CERVICAL DECOMPRESSION/DISCECTOMY FUSION CERVICAL THREE-FOUR,CERVICAL FOUR-FIVE,CERVICAL FIVE-SIX;  Surgeon: Erline Levine, MD;  Location: [facility];  Service: Neurosurgery;  Laterality: N/A;   ANTERIOR LAT LUMBAR FUSION Left 02/20/2018   Procedure: Left Lumbar Four-Five Anterolateral lumbar interbody fusion;  Surgeon: Erline Levine, MD;  Location: [facility];  Service: Neurosurgery;  Laterality: Left;   BACK SURGERY     COLONOSCOPY     KNEE SURGERY Right    LUMBAR PERCUTANEOUS PEDICLE SCREW 1 LEVEL N/A 02/20/2018   Procedure: Lumbar Percutaneous Pedicle Screw Placment Lumbar four-five;  Surgeon: Erline Levine, MD;  Location: [facility];  Service: Neurosurgery;  Laterality: N/A;   RIGHT HEART CATH AND CORONARY ANGIOGRAPHY N/A 03/27/2021   Procedure: RIGHT HEART CATH AND CORONARY ANGIOGRAPHY;  Surgeon: Jettie Booze, MD;  Location:  [facility];  Service: Cardiovascular;  Laterality: N/A;   TONSILLECTOMY     TOTAL KNEE ARTHROPLASTY Right 06/08/2019   Procedure: RIGHT TOTAL KNEE ARTHROPLASTY;  Surgeon: Melrose Nakayama, MD;  Location: [facility];  Service: Orthopedics;  Laterality: Right;   XI ROBOTIC ASSISTED INGUINAL HERNIA REPAIR WITH MESH Bilateral 11/21/2020   Procedure: ROBOTIC BILATERAL INGUINAL HERNIA REPAIR WITH MESH;  Surgeon: Ralene Ok, MD;  Location: [facility];  Service: General;  Laterality: Bilateral;     Family History  Problem Relation Age of Onset   Diabetes Father    Heart disease Father    Colon cancer Neg Hx     Social History Social History   Tobacco Use   Smoking status: Never   Smokeless tobacco: Never  Vaping Use   Vaping Use: Never used  Substance Use Topics   Alcohol use: Yes    Comment: rarely    Drug use: No    Current Outpatient Medications  Medication Sig Dispense Refill   acetaminophen (TYLENOL) 500 MG tablet Take 1,000 mg by mouth as needed for mild pain or headache.     allopurinol (ZYLOPRIM) 300 MG tablet Take 300 mg by mouth daily.     ALPRAZolam (XANAX) 0.5 MG tablet Take 0.25 mg by mouth 2 (two) times daily as needed for anxiety.     APPLE CIDER VINEGAR PO Take 1 tablet by mouth daily.     aspirin EC 81 MG tablet Take 1 tablet (81 mg total) by mouth daily. Swallow whole. 30 tablet 11   atorvastatin (LIPITOR) 40 MG tablet Take 40 mg by mouth every evening.      BLACK ELDERBERRY PO Take 1 tablet by mouth daily.     cholecalciferol (VITAMIN D3) 25 MCG (1000 UNIT) tablet Take 1,000 Units by mouth daily.     clopidogrel (PLAVIX) 75 MG tablet Take 1 tablet (75 mg total) by mouth daily.     finasteride (PROSCAR) 5 MG tablet Take 5 mg by mouth daily.     fluorouracil (EFUDEX) 5 % cream Apply 1 application topically daily as needed (sun damage).     Polyethyl Glycol-Propyl Glycol (SYSTANE OP) Place 1 drop into both eyes in the morning and at bedtime.     potassium citrate (UROCIT-K) 10 MEQ (1080 MG) SR tablet Take 10 mEq by mouth 2 (two) times daily.     Saline (SIMPLY SALINE) 0.9 % AERS Place 1 spray into the nose in the morning, at noon, and at bedtime.     selenium 50 MCG TABS tablet Take 50 mcg by mouth daily.     sertraline (ZOLOFT) 100 MG tablet Take 100 mg by mouth at bedtime.     tamsulosin (FLOMAX) 0.4 MG CAPS capsule Take 0.4 mg by mouth 2 (two) times daily.     traMADol (ULTRAM) 50 MG tablet Take 1 tablet (50 mg total) by mouth every 6 (six) hours  as needed. 20 tablet 0   triamcinolone (NASACORT) 55 MCG/ACT AERO nasal inhaler Place 1 spray into the nose in the morning.     zinc gluconate 50 MG tablet Take 50 mg by mouth daily.     No current facility-administered medications for this visit.    No Known Allergies  Review of Systems  Constitutional:  Positive for activity change and fatigue. Negative for chills and fever.  HENT: Negative.    Eyes: Negative.   Respiratory:  Positive for shortness of breath. Negative for chest tightness.   Cardiovascular:  Negative  for chest pain and leg swelling.  Gastrointestinal: Negative.   Endocrine: Negative.   Genitourinary: Negative.   Musculoskeletal:  Positive for arthralgias.  Skin: Negative.   Allergic/Immunologic: Negative.   Neurological:  Positive for dizziness. Negative for syncope.  Hematological:  Bruises/bleeds easily.  Psychiatric/Behavioral:  The patient is nervous/anxious.    BP 119/60   Pulse 90   Resp 20   Ht 5\' 9"  (1.753 m)   Wt 167 lb (75.8 kg)   SpO2 95% Comment: RA  BMI 24.66 kg/m  Physical Exam Constitutional:      Appearance: Normal appearance. He is normal weight.  HENT:     Escue: Normocephalic and atraumatic.  Eyes:     Extraocular Movements: Extraocular movements intact.     Conjunctiva/sclera: Conjunctivae normal.     Pupils: Pupils are equal, round, and reactive to light.  Neck:     Vascular: No carotid bruit.     Comments: 3/6 systolic murmur along the right sternal border.  No diastolic murmur. Cardiovascular:     Rate and Rhythm: Normal rate and regular rhythm.     Pulses: Normal pulses.     Heart sounds: Murmur heard.  Pulmonary:     Effort: Pulmonary effort is normal.     Breath sounds: Normal breath sounds.  Abdominal:     General: Abdomen is flat.     Palpations: Abdomen is soft.  Musculoskeletal:        General: No swelling. Normal range of motion.     Cervical back: Normal range of motion and neck supple.  Skin:    General: Skin  is warm and dry.  Neurological:     General: No focal deficit present.     Mental Status: He is alert and oriented to person, place, and time.  Psychiatric:        Mood and Affect: Mood normal.        Behavior: Behavior normal.     Diagnostic Tests:  ECHOCARDIOGRAM REPORT         Patient Name:   DAISHAWN LAUF Bewley Date of Exam: 01/08/2021  Medical Rec #:  [id number]     Height:       67.0 in  Accession #:    [id number]    Weight:       170.9 lb  Date of Birth:  [birth year]    BSA:          1.891 m  Patient Age:    39 years      BP:           134/68 mmHg  Patient Gender: M             HR:           64 bpm.  Exam Location:  [facility]   Procedure: 2D Echo, Cardiac Doppler and Color Doppler   Indications:    I35.0 Aortic Stenosis     History:        Patient has prior history of Echocardiogram examinations,  most                  recent 01/05/2018. Risk Factors:Dyslipidemia. Anxiety. TIA.     Sonographer:    Wilford Sports Rodgers-Jones RDCS  Referring Phys: Snyder     1. Left ventricular ejection fraction, by estimation, is 60 to 65%. The  left ventricle has normal function. The left ventricle has no regional  wall motion abnormalities. Left ventricular diastolic parameters  are  indeterminate.   2. Right ventricular systolic function is normal. The right ventricular  size is normal.   3. The mitral valve is normal in structure. No evidence of mitral valve  regurgitation. No evidence of mitral stenosis.   4. The mean aortic valve gradient has increased from 22 mmHg to 40 mmHg  since his previous echo in 2019.      . The aortic valve is calcified. There is severe calcifcation of the  aortic valve. There is severe thickening of the aortic valve. Aortic valve  regurgitation is trivial. Severe aortic valve stenosis.   5. Aortic dilatation noted. There is moderate dilatation of the ascending  aorta, measuring 40 mm.   FINDINGS   Left Ventricle: Left  ventricular ejection fraction, by estimation, is 60  to 65%. The left ventricle has normal function. The left ventricle has no  regional wall motion abnormalities. The left ventricular internal cavity  size was normal in size. There is   no left ventricular hypertrophy. Left ventricular diastolic parameters  are indeterminate.   Right Ventricle: The right ventricular size is normal. No increase in  right ventricular wall thickness. Right ventricular systolic function is  normal.   Left Atrium: Left atrial size was normal in size.   Right Atrium: Right atrial size was normal in size.   Pericardium: There is no evidence of pericardial effusion.   Mitral Valve: The mitral valve is normal in structure. No evidence of  mitral valve regurgitation. No evidence of mitral valve stenosis.   Tricuspid Valve: The tricuspid valve is normal in structure. Tricuspid  valve regurgitation is trivial. No evidence of tricuspid stenosis.   Aortic Valve: The mean aortic valve gradient has increased from 22 mmHg to  40 mmHg since his previous echo in 2019.  The aortic valve is calcified. There is severe calcifcation of the aortic  valve. There is severe thickening of the aortic valve. Aortic valve  regurgitation is trivial. Aortic regurgitation PHT measures 606 msec.  Severe aortic stenosis is present. Aortic  valve mean gradient measures 40.2 mmHg. Aortic valve peak gradient  measures 59.9 mmHg. Aortic valve area, by VTI measures 0.66 cm.   Pulmonic Valve: The pulmonic valve was grossly normal. Pulmonic valve  regurgitation is not visualized.   Aorta: Aortic dilatation noted. There is moderate dilatation of the  ascending aorta, measuring 40 mm.   IAS/Shunts: The atrial septum is grossly normal.      LEFT VENTRICLE  PLAX 2D  LVIDd:         4.00 cm  Diastology  LVIDs:         2.80 cm  LV e' medial:    7.83 cm/s  LV PW:         1.30 cm  LV E/e' medial:  13.0  LV IVS:        0.80 cm  LV e'  lateral:   5.84 cm/s  LVOT diam:     2.30 cm  LV E/e' lateral: 17.5  LV SV:         63  LV SV Index:   33  LVOT Area:     4.15 cm      RIGHT VENTRICLE  RV Basal diam:  3.90 cm  RV S prime:     15.47 cm/s  TAPSE (M-mode): 1.8 cm   LEFT ATRIUM             Index       RIGHT ATRIUM  Index  LA diam:        3.70 cm 1.96 cm/m  RA Area:     9.50 cm  LA Vol (A2C):   41.6 ml 22.00 ml/m RA Volume:   20.60 ml 10.89 ml/m  LA Vol (A4C):   37.0 ml 19.56 ml/m  LA Biplane Vol: 42.2 ml 22.31 ml/m   AORTIC VALVE  AV Area (Vmax):    0.70 cm  AV Area (Vmean):   0.62 cm  AV Area (VTI):     0.66 cm  AV Vmax:           387.00 cm/s  AV Vmean:          297.200 cm/s  AV VTI:            0.950 m  AV Peak Grad:      59.9 mmHg  AV Mean Grad:      40.2 mmHg  LVOT Vmax:         65.30 cm/s  LVOT Vmean:        44.350 cm/s  LVOT VTI:          0.152 m  LVOT/AV VTI ratio: 0.16  AI PHT:            606 msec     AORTA  Ao Root diam: 3.60 cm  Ao Asc diam:  4.00 cm   MITRAL VALVE  MV Area (PHT): 2.50 cm     SHUNTS  MV Decel Time: 303 msec     Systemic VTI:  0.15 m  MV E velocity: 102.00 cm/s  Systemic Diam: 2.30 cm  MV A velocity: 106.00 cm/s  MV E/A ratio:  0.96   Mertie Moores MD  Electronically signed by Mertie Moores MD  Signature Date/Time: 01/08/2021/1:16:43 PM         Final     Physicians  Panel Physicians Referring Physician Case Authorizing Physician  Jettie Booze, MD (Primary)     Procedures  RIGHT HEART CATH AND CORONARY ANGIOGRAPHY   Conclusion      Ost LM lesion is 25% stenosed.   Prox LAD to Mid LAD lesion is 75% stenosed.   Mid RCA lesion is 90% stenosed.   1st Diag lesion is 50% stenosed.   Mid LAD lesion is 25% stenosed.   Severe aortic stenosis by echo.   Right Heart: Ao sat 92%, PA sat 71%, mean PA pressure 12 mmHg, mean PCW P 6 mmHg; cardiac output 6.7 L/min, cardiac index 3.5   Add aspirin 81 mg daily.   Cardiac surgery referral for  CABG/AVR eval.   Indications  Severe aortic stenosis [I35.0 (ICD-10-CM)]   Procedural Details  Technical Details The risks, benefits, and details of the procedure were explained to the patient.  The patient verbalized understanding and wanted to proceed.  Informed written consent was obtained.  PROCEDURE TECHNIQUE:  After Xylocaine anesthesia, a 5 French sheath was placed in the right antecubital area in exchange for a peripheral IV. A 5 French balloontipped Swan-Ganz catheter was advanced to the pulmonary artery under fluoroscopic guidance. Hemodynamic pressures were obtained. Oxygen saturations were obtained. After Xylocaine anesthesia, a 98F sheath was placed in the right radial artery with a single anterior needle wall stick.  Right coronary angiography was done using a Judkins R4 guide catheter.  Left coronary angiography was done using a Judkins L3.5 guide catheter.   Left heart cath was attempted, but unable to cross artic valve with straight wire and AL1.  Contrast: 60 cc   Estimated blood loss <50 mL.   During this procedure medications were administered to achieve and maintain moderate conscious sedation while the patient's heart rate, blood pressure, and oxygen saturation were continuously monitored and I was present face-to-face 100% of this time.   Medications (Filter: Administrations occurring from 8588 to 0952 on 03/27/21)  important  Continuous medications are totaled by the amount administered until 03/27/21 0952.   fentaNYL (SUBLIMAZE) injection (mcg) Total dose:  25 mcg Date/Time Rate/Dose/Volume Action   03/27/21 0858 25 mcg Given    midazolam (VERSED) injection (mg) Total dose:  1 mg Date/Time Rate/Dose/Volume Action   03/27/21 0858 1 mg Given    Heparin (Porcine) in NaCl 1000-0.9 UT/500ML-% SOLN (mL) Total volume:  1,000 mL Date/Time Rate/Dose/Volume Action   03/27/21 0858 500 mL Given   0858 500 mL Given    lidocaine (PF) (XYLOCAINE) 1 % injection  (mL) Total volume:  4 mL Date/Time Rate/Dose/Volume Action   03/27/21 0906 2 mL Given   0907 2 mL Given    heparin sodium (porcine) injection (Units) Total dose:  4,000 Units Date/Time Rate/Dose/Volume Action   03/27/21 0918 4,000 Units Given    iohexol (OMNIPAQUE) 350 MG/ML injection (mL) Total volume:  60 mL Date/Time Rate/Dose/Volume Action   03/27/21 0937 60 mL Given    Radial Cocktail/Verapamil only (mL) Total volume:  10 mL Date/Time Rate/Dose/Volume Action   03/27/21 0910 10 mL Given    0.9 %  sodium chloride infusion (mL/hr) Total volume:  0.69 mL Dosing weight:  76 Date/Time Rate/Dose/Volume Action   03/27/21 0952 75 mL/hr Rate/Dose Change    Sedation Time  Sedation Time Physician-1: 38 minutes 11 seconds Contrast  Medication Name Total Dose  iohexol (OMNIPAQUE) 350 MG/ML injection 60 mL   Radiation/Fluoro  Fluoro time: 8.3 (min) DAP: 20476 (mGycm2) Cumulative Air Kerma: 393 (mGy) Coronary Findings  Diagnostic Dominance: Right Left Main  Ost LM lesion is 25% stenosed.  Left Anterior Descending  The vessel exhibits minimal luminal irregularities.  Prox LAD to Mid LAD lesion is 75% stenosed.  Mid LAD lesion is 25% stenosed.  First Diagonal Branch  1st Diag lesion is 50% stenosed.  Left Circumflex  The vessel exhibits minimal luminal irregularities.  Right Coronary Artery  Vessel is large.  Mid RCA lesion is 90% stenosed.  Intervention  No interventions have been documented. Right Heart  Right Heart Pressures Ao sat 92%, PA sat 71%, mean PA pressure 12 mmHg, mean PCW P 6 mmHg; cardiac output 6.7 L/min, cardiac index 3.5   Left Heart  Aortic Valve The aortic valve is calcified.   Coronary Diagrams  Diagnostic Dominance: Right Intervention  Implants     No implant documentation for this case.   Syng[employer] Images   Show images for CARDIAC CATHETERIZATION Images on Long Term Storage   Show images for Fines, JERMAL DISMUKE to Procedure  Log  Procedure Log    Hemo Data  Flowsheet Row Most Recent Value  Fick Cardiac Output 6.76 L/min  Fick Cardiac Output Index 3.53 (L/min)/BSA  RA A Wave 3 mmHg  RA V Wave 2 mmHg  RA Mean 1 mmHg  RV Systolic Pressure 24 mmHg  RV Diastolic Pressure 0 mmHg  RV EDP 4 mmHg  PA Systolic Pressure 22 mmHg  PA Diastolic Pressure 5 mmHg  PA Mean 12 mmHg  PW A Wave 9 mmHg  PW V Wave 8 mmHg  PW Mean 6 mmHg  AO Systolic Pressure 502  mmHg  AO Diastolic Pressure 55 mmHg  AO Mean 78 mmHg  QP/QS 1  TPVR Index 3.4 HRUI  TSVR Index 22.11 HRUI  PVR SVR Ratio 0.08  TPVR/TSVR Ratio 0.15    Impression:  This 75 year old gentleman has stage D, severe, symptomatic aortic stenosis with New York Heart Association class II symptoms of exertional fatigue and shortness of breath consistent with chronic diastolic congestive heart failure.  He also recently had 1 episode of presyncope.  I have personally reviewed his 2D echocardiogram and cardiac catheterization.  His echo shows a trileaflet aortic valve with severe thickening and calcification.  The mean gradient is 40 mmHg consistent with severe aortic stenosis.  Left ventricular ejection fraction is normal.  His cardiac catheterization shows severe two-vessel coronary disease with a 90% mid RCA stenosis and 75% proximal LAD stenosis.  I agree with the best treatment for this patient is coronary bypass graft surgery and aortic valve replacement.  Given his age I would use a bioprosthetic valve.  I reviewed the cardiac catheterization and echocardiogram studies with him and his wife in detail and answered his questions. I discussed the operative procedure with the patient and his wife including alternatives, benefits and risks; including but not limited to bleeding, blood transfusion, infection, stroke, myocardial infarction, graft failure, heart block requiring a permanent pacemaker, organ dysfunction, and death.  Okey Regal Germond understands and agrees to proceed.     Plan:  He will be scheduled for aortic valve replacement and coronary bypass graft surgery using a bioprosthetic valve on Friday, 04/27/2021.  He will need to discontinue his Plavix 7 days preoperatively and should continue his aspirin.  I spent 60 minutes performing this consultation and > 50% of this time was spent face to face counseling and coordinating the care of this patient's severe symptomatic aortic stenosis and severe two-vessel coronary disease.     Gaye Pollack, MD [facility] [phone]

## 2021-04-19 ENCOUNTER — Encounter: Payer: Medicare Other | Admitting: Surgery

## 2021-04-24 NOTE — Progress Notes (Signed)
Surgical Instructions    Your procedure is scheduled on Friday October 14.  Report to [facility] Main Entrance "A" at 5:30 A.M., then check in with the Admitting office.  Call this number if you have problems the morning of surgery:  [phone]   If you have any questions prior to your surgery date call [phone]: Open Monday-Friday 8am-4pm    Remember:  Do not eat or drink anything after midnight the night before your surgery     Take these medicines the morning of surgery with A SIP OF WATER: acetaminophen (TYLENOL) allopurinol (ZYLOPRIM) ALPRAZolam (XANAX)if needed atorvastatin (LIPITOR)  finasteride (PROSCAR)  tamsulosin (FLOMAX)  triamcinolone (NASACORT)   As of today, STOP taking any Aspirin (unless otherwise instructed by your surgeon) Aleve, Naproxen, Ibuprofen, Motrin, Advil, Goody's, BC's, all herbal medications, fish oil, and all vitamins.     After your COVID test   You are not required to quarantine however you are required to wear a well-fitting mask when you are out and around people not in your household.  If your mask becomes wet or soiled, replace with a new one.  Wash your hands often with soap and water for 20 seconds or clean your hands with an alcohol-based hand sanitizer that contains at least 60% alcohol.  Do not share personal items.  Notify your provider: if you are in close contact with someone who has COVID  or if you develop a fever of 100.4 or greater, sneezing, cough, sore throat, shortness of breath or body aches.             Do not wear jewelry or makeup Do not wear lotions, powders, perfumes/colognes, or deodorant. Do not shave 48 hours prior to surgery.  Men may shave face and neck. Do not bring valuables to the hospital. DO Not wear nail polish, gel polish, artificial nails, or any other type of covering on natural nails including finger and toenails. If patients have artificial nails, gel coating, etc. that need to be removed  by a nail salon, please have this removed prior to surgery or surgery may need to be canceled/delayed if the surgeon/ anesthesia feels like the patient is unable to be adequately monitored.             [facility] is not responsible for any belongings or valuables.  Do NOT Smoke (Tobacco/Vaping)  24 hours prior to your procedure  If you use a CPAP at night, you may bring your mask for your overnight stay.   Contacts, glasses, hearing aids, dentures or partials may not be worn into surgery, please bring cases for these belongings   For patients admitted to the hospital, discharge time will be determined by your treatment team.   Patients discharged the day of surgery will not be allowed to drive home, and someone needs to stay with them for 24 hours.  NO VISITORS WILL BE ALLOWED IN PRE-OP WHERE PATIENTS ARE PREPPED FOR SURGERY.  ONLY 1 SUPPORT PERSON MAY BE PRESENT IN THE WAITING ROOM WHILE YOU ARE IN SURGERY.  IF YOU ARE TO BE ADMITTED, ONCE YOU ARE IN YOUR ROOM YOU WILL BE ALLOWED TWO (2) VISITORS. 1 (ONE) VISITOR MAY STAY OVERNIGHT BUT MUST ARRIVE TO THE ROOM BY 8pm.  Minor children may have two parents present. Special consideration for safety and communication needs will be reviewed on a case by case basis.  Special instructions:    Oral Hygiene is also important to reduce your risk of infection.  Remember -  BRUSH YOUR TEETH THE MORNING OF SURGERY WITH YOUR REGULAR TOOTHPASTE   [place]- Preparing For Surgery  Before surgery, you can play an important role. Because skin is not sterile, your skin needs to be as free of germs as possible. You can reduce the number of germs on your skin by washing with CHG (chlorahexidine gluconate) Soap before surgery.  CHG is an antiseptic cleaner which kills germs and bonds with the skin to continue killing germs even after washing.     Please do not use if you have an allergy to CHG or antibacterial soaps. If your skin becomes reddened/irritated  stop using the CHG.  Do not shave (including legs and underarms) for at least 48 hours prior to first CHG shower. It is OK to shave your face.  Please follow these instructions carefully.     Shower the NIGHT BEFORE SURGERY and the MORNING OF SURGERY with CHG Soap.   If you chose to wash your hair, wash your hair first as usual with your normal shampoo. After you shampoo, rinse your hair and body thoroughly to remove the shampoo.  Then [employer] and genitals (private parts) with your normal soap and rinse thoroughly to remove soap.  After that Use CHG Soap as you would any other liquid soap. You can apply CHG directly to the skin and wash gently with a scrungie or a clean washcloth.   Apply the CHG Soap to your body ONLY FROM THE NECK DOWN.  Do not use on open wounds or open sores. Avoid contact with your eyes, ears, mouth and genitals (private parts). Wash Face and genitals (private parts)  with your normal soap.   Wash thoroughly, paying special attention to the area where your surgery will be performed.  Thoroughly rinse your body with warm water from the neck down.  DO NOT shower/wash with your normal soap after using and rinsing off the CHG Soap.  Pat yourself dry with a CLEAN TOWEL.  Wear CLEAN PAJAMAS to bed the night before surgery  Place CLEAN SHEETS on your bed the night before your surgery  DO NOT SLEEP WITH PETS.   Day of Surgery:  Take a shower with CHG soap. Wear Clean/Comfortable clothing the morning of surgery Do not apply any deodorants/lotions.   Remember to brush your teeth WITH YOUR REGULAR TOOTHPASTE.   Please read over the following fact sheets that you were given.

## 2021-04-25 ENCOUNTER — Encounter (HOSPITAL_COMMUNITY): Payer: Self-pay

## 2021-04-25 ENCOUNTER — Other Ambulatory Visit: Payer: Self-pay

## 2021-04-25 ENCOUNTER — Ambulatory Visit (HOSPITAL_COMMUNITY)
Admission: RE | Admit: 2021-04-25 | Discharge: 2021-04-25 | Disposition: A | Discharge status: Home / Self Care | Payer: Medicare Other | Source: Ambulatory Visit | Attending: Surgery | Admitting: Surgery

## 2021-04-25 ENCOUNTER — Encounter (HOSPITAL_COMMUNITY)
Admission: RE | Admit: 2021-04-25 | Discharge: 2021-04-25 | Disposition: A | Discharge status: Home / Self Care | Payer: Medicare Other | Source: Ambulatory Visit | Attending: Surgery | Admitting: Surgery

## 2021-04-25 ENCOUNTER — Ambulatory Visit (HOSPITAL_BASED_OUTPATIENT_CLINIC_OR_DEPARTMENT_OTHER)
Admission: RE | Admit: 2021-04-25 | Discharge: 2021-04-25 | Disposition: A | Discharge status: Home / Self Care | Payer: Medicare Other | Source: Ambulatory Visit | Attending: Surgery | Admitting: Surgery

## 2021-04-25 DIAGNOSIS — I2510 Atherosclerotic heart disease of native coronary artery without angina pectoris: Secondary | ICD-10-CM | POA: Diagnosis not present

## 2021-04-25 DIAGNOSIS — I350 Nonrheumatic aortic (valve) stenosis: Secondary | ICD-10-CM | POA: Insufficient documentation

## 2021-04-25 DIAGNOSIS — Z951 Presence of aortocoronary bypass graft: Secondary | ICD-10-CM | POA: Insufficient documentation

## 2021-04-25 DIAGNOSIS — Z01818 Encounter for other preprocedural examination: Secondary | ICD-10-CM | POA: Insufficient documentation

## 2021-04-25 DIAGNOSIS — E785 Hyperlipidemia, unspecified: Secondary | ICD-10-CM | POA: Insufficient documentation

## 2021-04-25 DIAGNOSIS — Z20822 Contact with and (suspected) exposure to covid-19: Secondary | ICD-10-CM | POA: Insufficient documentation

## 2021-04-25 HISTORY — DX: Atherosclerotic heart disease of native coronary artery without angina pectoris: I25.10

## 2021-04-25 HISTORY — DX: Other complications of anesthesia, initial encounter: T88.59XA

## 2021-04-25 LAB — COMPREHENSIVE METABOLIC PANEL
ALT: 19 U/L (ref 0–44)
AST: 28 U/L (ref 15–41)
Albumin: 4.1 g/dL (ref 3.5–5.0)
Alkaline Phosphatase: 63 U/L (ref 38–126)
Anion gap: 9 (ref 5–15)
BUN: 13 mg/dL (ref 8–23)
CO2: 25 mmol/L (ref 22–32)
Calcium: 9.2 mg/dL (ref 8.9–10.3)
Chloride: 102 mmol/L (ref 98–111)
Creatinine, Ser: 0.67 mg/dL (ref 0.61–1.24)
GFR, Estimated: >60 mL/min (ref >60)
Glucose, Bld: 122 mg/dL — ABNORMAL HIGH (ref 70–99)
Potassium: 4.1 mmol/L (ref 3.5–5.1)
Sodium: 136 mmol/L (ref 135–145)
Total Bilirubin: 0.6 mg/dL (ref 0.3–1.2)
Total Protein: 6.9 g/dL (ref 6.5–8.1)

## 2021-04-25 LAB — URINALYSIS, ROUTINE W REFLEX MICROSCOPIC
Bilirubin Urine: NEGATIVE
Glucose, UA: NEGATIVE mg/dL
Hgb urine dipstick: NEGATIVE
Ketones, ur: NEGATIVE mg/dL
Leukocytes,Ua: NEGATIVE
Nitrite: NEGATIVE
Protein, ur: NEGATIVE mg/dL
Specific Gravity, Urine: 1.005 (ref 1.005–1.030)
pH: 5 (ref 5.0–8.0)

## 2021-04-25 LAB — BLOOD GAS, ARTERIAL
Acid-Base Excess: 2.4 mmol/L — ABNORMAL HIGH (ref 0.0–2.0)
Bicarbonate: 26.6 mmol/L (ref 20.0–28.0)
Drawn by: 602861
FIO2: 21
O2 Saturation: 97.6 %
Patient temperature: 37
pCO2 arterial: 43.2 mmHg (ref 32.0–48.0)
pH, Arterial: 7.407 (ref 7.350–7.450)
pO2, Arterial: 95.9 mmHg (ref 83.0–108.0)

## 2021-04-25 LAB — APTT: aPTT: 31 seconds (ref 24–36)

## 2021-04-25 LAB — SURGICAL PCR SCREEN
MRSA, PCR: NEGATIVE
Staphylococcus aureus: POSITIVE — AB

## 2021-04-25 LAB — PROTIME-INR
INR: 1.1 (ref 0.8–1.2)
Prothrombin Time: 14.1 seconds (ref 11.4–15.2)

## 2021-04-25 LAB — CBC
HCT: 39.8 % (ref 39.0–52.0)
Hemoglobin: 12.8 g/dL — ABNORMAL LOW (ref 13.0–17.0)
MCH: 31.6 pg (ref 26.0–34.0)
MCHC: 32.2 g/dL (ref 30.0–36.0)
MCV: 98.3 fL (ref 80.0–100.0)
Platelets: 166 10*3/uL (ref 150–400)
RBC: 4.05 MIL/uL — ABNORMAL LOW (ref 4.22–5.81)
RDW: 14.1 % (ref 11.5–15.5)
WBC: 4.6 10*3/uL (ref 4.0–10.5)
nRBC: 0 % (ref 0.0–0.2)

## 2021-04-25 LAB — HEMOGLOBIN A1C
Hgb A1c MFr Bld: 5.7 % — ABNORMAL HIGH (ref 4.8–5.6)
Mean Plasma Glucose: 116.89 mg/dL

## 2021-04-25 LAB — SARS CORONAVIRUS 2 (TAT 6-24 HRS): SARS Coronavirus 2: NEGATIVE

## 2021-04-25 NOTE — Progress Notes (Signed)
PCP - Dr. Pat Kocher Cardiologist - Dr. Irish Lack  Chest x-ray - 04/25/21 EKG - 04/25/21 Stress Test - 15+ years ago in [place], [place] ECHO - 01/08/21 Cardiac Cath - 03/27/21  Sleep Study - denies CPAP - denies  Blood Thinner Instructions: Hold 7 days. LD, 04/19/21 Aspirin Instructions: Continue per surgeon   COVID TEST- 04/25/21; done in PAT  Anesthesia review: Yes, hx of CAD.  Patient denies shortness of breath, fever, cough and chest pain at PAT appointment   All instructions explained to the patient, with a verbal understanding of the material. Patient agrees to go over the instructions while at home for a better understanding. Patient also instructed to self quarantine after being tested for COVID-19. The opportunity to ask questions was provided.

## 2021-04-25 NOTE — Progress Notes (Signed)
Pre-CABG Dopplers completed. Refer to "CV Proc" under chart review to view preliminary results.  04/25/2021 1:28 PM Kelby Aline., MHA, RVT, RDCS, RDMS

## 2021-04-26 MED ORDER — PHENYLEPHRINE HCL-NACL 20-0.9 MG/250ML-% IV SOLN
30.0000 ug/min | INTRAVENOUS | Status: AC
  Administered 2021-04-27: 50 ug/min via INTRAVENOUS
  Administered 2021-04-27: 70 ug/min via INTRAVENOUS
  Filled 2021-04-26: qty 250

## 2021-04-26 MED ORDER — DEXMEDETOMIDINE HCL IN NACL 400 MCG/100ML IV SOLN
0.1000 ug/kg/h | INTRAVENOUS | Status: AC
  Administered 2021-04-27: .3 ug/kg/h via INTRAVENOUS
  Filled 2021-04-26: qty 100

## 2021-04-26 MED ORDER — POTASSIUM CHLORIDE 2 MEQ/ML IV SOLN
80.0000 meq | INTRAVENOUS | Status: DC
  Filled 2021-04-26: qty 40

## 2021-04-26 MED ORDER — INSULIN REGULAR(HUMAN) IN NACL 100-0.9 UT/100ML-% IV SOLN
INTRAVENOUS | Status: AC
  Administered 2021-04-27: 1 [IU]/h via INTRAVENOUS
  Filled 2021-04-26: qty 100

## 2021-04-26 MED ORDER — CEFAZOLIN SODIUM-DEXTROSE 2-4 GM/100ML-% IV SOLN
2.0000 g | INTRAVENOUS | Status: DC
  Filled 2021-04-26: qty 100

## 2021-04-26 MED ORDER — MANNITOL 20 % IV SOLN
INTRAVENOUS | Status: DC
  Filled 2021-04-26: qty 13

## 2021-04-26 MED ORDER — TRANEXAMIC ACID (OHS) BOLUS VIA INFUSION
15.0000 mg/kg | INTRAVENOUS | Status: DC
  Filled 2021-04-26: qty 1148

## 2021-04-26 MED ORDER — MILRINONE LACTATE IN DEXTROSE 20-5 MG/100ML-% IV SOLN
0.3000 ug/kg/min | INTRAVENOUS | Status: DC
  Filled 2021-04-26: qty 100

## 2021-04-26 MED ORDER — EPINEPHRINE HCL 5 MG/250ML IV SOLN IN NS
0.0000 ug/min | INTRAVENOUS | Status: DC
  Filled 2021-04-26: qty 250

## 2021-04-26 MED ORDER — HEPARIN 30,000 UNITS/1000 ML (OHS) CELLSAVER SOLUTION
Status: DC
  Filled 2021-04-26: qty 1000

## 2021-04-26 MED ORDER — MAGNESIUM SULFATE 50 % IJ SOLN
40.0000 meq | INTRAMUSCULAR | Status: DC
  Filled 2021-04-26: qty 9.85

## 2021-04-26 MED ORDER — NITROGLYCERIN IN D5W 200-5 MCG/ML-% IV SOLN
2.0000 ug/min | INTRAVENOUS | Status: DC
  Filled 2021-04-26: qty 250

## 2021-04-26 MED ORDER — TRANEXAMIC ACID 1000 MG/10ML IV SOLN
1.5000 mg/kg/h | INTRAVENOUS | Status: AC
  Administered 2021-04-27: 15 mg/kg/h via INTRAVENOUS
  Filled 2021-04-26: qty 25

## 2021-04-26 MED ORDER — TRANEXAMIC ACID (OHS) PUMP PRIME SOLUTION
2.0000 mg/kg | INTRAVENOUS | Status: DC
  Filled 2021-04-26: qty 1.53

## 2021-04-26 MED ORDER — NOREPINEPHRINE 4 MG/250ML-% IV SOLN
0.0000 ug/min | INTRAVENOUS | Status: AC
  Administered 2021-04-27: 2 ug/min via INTRAVENOUS
  Filled 2021-04-26: qty 250

## 2021-04-26 MED ORDER — VANCOMYCIN HCL 1250 MG/250ML IV SOLN
1250.0000 mg | INTRAVENOUS | Status: AC
  Administered 2021-04-27: 1250 mg via INTRAVENOUS
  Filled 2021-04-26: qty 250

## 2021-04-26 MED ORDER — PLASMA-LYTE A IV SOLN
INTRAVENOUS | Status: DC
  Filled 2021-04-26: qty 5

## 2021-04-27 ENCOUNTER — Other Ambulatory Visit: Payer: Self-pay

## 2021-04-27 ENCOUNTER — Inpatient Hospital Stay (HOSPITAL_COMMUNITY): Payer: Medicare Other | Admitting: Emergency Medicine

## 2021-04-27 ENCOUNTER — Encounter (HOSPITAL_COMMUNITY)
Admission: RE | Disposition: A | Discharge status: Home / Self Care | Payer: Self-pay | Source: Home / Self Care | Attending: Surgery

## 2021-04-27 ENCOUNTER — Inpatient Hospital Stay (HOSPITAL_COMMUNITY)
Admission: RE | Admit: 2021-04-27 | Discharge: 2021-05-03 | DRG: 220 | Disposition: A | Discharge status: Home / Self Care | Payer: Medicare Other | Attending: Surgery | Admitting: Surgery

## 2021-04-27 ENCOUNTER — Inpatient Hospital Stay (HOSPITAL_COMMUNITY): Payer: Medicare Other

## 2021-04-27 ENCOUNTER — Inpatient Hospital Stay (HOSPITAL_COMMUNITY): Payer: Medicare Other | Admitting: Certified Registered"

## 2021-04-27 ENCOUNTER — Encounter (HOSPITAL_COMMUNITY): Payer: Self-pay | Admitting: Surgery

## 2021-04-27 DIAGNOSIS — E7800 Pure hypercholesterolemia, unspecified: Secondary | ICD-10-CM | POA: Diagnosis present

## 2021-04-27 DIAGNOSIS — F32A Depression, unspecified: Secondary | ICD-10-CM | POA: Diagnosis present

## 2021-04-27 DIAGNOSIS — D62 Acute posthemorrhagic anemia: Secondary | ICD-10-CM | POA: Diagnosis not present

## 2021-04-27 DIAGNOSIS — Z86010 Personal history of colonic polyps: Secondary | ICD-10-CM

## 2021-04-27 DIAGNOSIS — I2510 Atherosclerotic heart disease of native coronary artery without angina pectoris: Secondary | ICD-10-CM | POA: Diagnosis present

## 2021-04-27 DIAGNOSIS — Z539 Procedure and treatment not carried out, unspecified reason: Secondary | ICD-10-CM | POA: Diagnosis not present

## 2021-04-27 DIAGNOSIS — Z8673 Personal history of transient ischemic attack (TIA), and cerebral infarction without residual deficits: Secondary | ICD-10-CM | POA: Diagnosis not present

## 2021-04-27 DIAGNOSIS — Z79899 Other long term (current) drug therapy: Secondary | ICD-10-CM | POA: Diagnosis not present

## 2021-04-27 DIAGNOSIS — Z87442 Personal history of urinary calculi: Secondary | ICD-10-CM | POA: Diagnosis not present

## 2021-04-27 DIAGNOSIS — Z96651 Presence of right artificial knee joint: Secondary | ICD-10-CM | POA: Diagnosis present

## 2021-04-27 DIAGNOSIS — I350 Nonrheumatic aortic (valve) stenosis: Secondary | ICD-10-CM | POA: Diagnosis present

## 2021-04-27 DIAGNOSIS — D696 Thrombocytopenia, unspecified: Secondary | ICD-10-CM | POA: Diagnosis not present

## 2021-04-27 DIAGNOSIS — F419 Anxiety disorder, unspecified: Secondary | ICD-10-CM | POA: Diagnosis present

## 2021-04-27 DIAGNOSIS — Z20822 Contact with and (suspected) exposure to covid-19: Secondary | ICD-10-CM | POA: Diagnosis present

## 2021-04-27 DIAGNOSIS — R000 Tachycardia, unspecified: Secondary | ICD-10-CM | POA: Diagnosis not present

## 2021-04-27 DIAGNOSIS — I5032 Chronic diastolic (congestive) heart failure: Secondary | ICD-10-CM | POA: Diagnosis not present

## 2021-04-27 DIAGNOSIS — J9383 Other pneumothorax: Secondary | ICD-10-CM | POA: Diagnosis present

## 2021-04-27 DIAGNOSIS — I6521 Occlusion and stenosis of right carotid artery: Secondary | ICD-10-CM | POA: Diagnosis present

## 2021-04-27 DIAGNOSIS — Z8249 Family history of ischemic heart disease and other diseases of the circulatory system: Secondary | ICD-10-CM | POA: Diagnosis not present

## 2021-04-27 DIAGNOSIS — M1990 Unspecified osteoarthritis, unspecified site: Secondary | ICD-10-CM | POA: Diagnosis present

## 2021-04-27 DIAGNOSIS — Z7902 Long term (current) use of antithrombotics/antiplatelets: Secondary | ICD-10-CM

## 2021-04-27 DIAGNOSIS — J939 Pneumothorax, unspecified: Secondary | ICD-10-CM

## 2021-04-27 DIAGNOSIS — Z951 Presence of aortocoronary bypass graft: Secondary | ICD-10-CM

## 2021-04-27 DIAGNOSIS — Z953 Presence of xenogenic heart valve: Secondary | ICD-10-CM

## 2021-04-27 HISTORY — PX: AORTIC VALVE REPLACEMENT: SHX41

## 2021-04-27 HISTORY — PX: ENDOVEIN HARVEST OF GREATER SAPHENOUS VEIN: SHX5059

## 2021-04-27 HISTORY — PX: CORONARY ARTERY BYPASS GRAFT: SHX141

## 2021-04-27 HISTORY — PX: TEE WITHOUT CARDIOVERSION: SHX5443

## 2021-04-27 LAB — CBC
HCT: 27 % — ABNORMAL LOW (ref 39.0–52.0)
HCT: 28.6 % — ABNORMAL LOW (ref 39.0–52.0)
Hemoglobin: 8.7 g/dL — ABNORMAL LOW (ref 13.0–17.0)
Hemoglobin: 9.7 g/dL — ABNORMAL LOW (ref 13.0–17.0)
MCH: 32 pg (ref 26.0–34.0)
MCH: 32.4 pg (ref 26.0–34.0)
MCHC: 32.2 g/dL (ref 30.0–36.0)
MCHC: 33.9 g/dL (ref 30.0–36.0)
MCV: 99.3 fL (ref 80.0–100.0)
MCV: 95.7 fL (ref 80.0–100.0)
Platelets: 110 10*3/uL — ABNORMAL LOW (ref 150–400)
Platelets: 129 10*3/uL — ABNORMAL LOW (ref 150–400)
RBC: 2.72 MIL/uL — ABNORMAL LOW (ref 4.22–5.81)
RBC: 2.99 MIL/uL — ABNORMAL LOW (ref 4.22–5.81)
RDW: 14.1 % (ref 11.5–15.5)
RDW: 14.2 % (ref 11.5–15.5)
WBC: 8.5 10*3/uL (ref 4.0–10.5)
WBC: 9.5 10*3/uL (ref 4.0–10.5)
nRBC: 0 % (ref 0.0–0.2)
nRBC: 0 % (ref 0.0–0.2)

## 2021-04-27 LAB — POCT I-STAT EG7
Acid-Base Excess: 2 mmol/L (ref 0.0–2.0)
Bicarbonate: 27.8 mmol/L (ref 20.0–28.0)
Calcium, Ion: 1.06 mmol/L — ABNORMAL LOW (ref 1.15–1.40)
HCT: 27 % — ABNORMAL LOW (ref 39.0–52.0)
Hemoglobin: 9.2 g/dL — ABNORMAL LOW (ref 13.0–17.0)
O2 Saturation: 91 %
Potassium: 4.2 mmol/L (ref 3.5–5.1)
Sodium: 140 mmol/L (ref 135–145)
TCO2: 29 mmol/L (ref 22–32)
pCO2, Ven: 47.2 mmHg (ref 44.0–60.0)
pH, Ven: 7.377 (ref 7.250–7.430)
pO2, Ven: 63 mmHg — ABNORMAL HIGH (ref 32.0–45.0)

## 2021-04-27 LAB — BASIC METABOLIC PANEL
Anion gap: 5 (ref 5–15)
BUN: 11 mg/dL (ref 8–23)
CO2: 24 mmol/L (ref 22–32)
Calcium: 8 mg/dL — ABNORMAL LOW (ref 8.9–10.3)
Chloride: 109 mmol/L (ref 98–111)
Creatinine, Ser: 0.69 mg/dL (ref 0.61–1.24)
GFR, Estimated: >60 mL/min (ref >60)
Glucose, Bld: 134 mg/dL — ABNORMAL HIGH (ref 70–99)
Potassium: 4.5 mmol/L (ref 3.5–5.1)
Sodium: 138 mmol/L (ref 135–145)

## 2021-04-27 LAB — POCT I-STAT 7, (LYTES, BLD GAS, ICA,H+H)
Acid-Base Excess: 4 mmol/L — ABNORMAL HIGH (ref 0.0–2.0)
Acid-Base Excess: 5 mmol/L — ABNORMAL HIGH (ref 0.0–2.0)
Acid-Base Excess: 3 mmol/L — ABNORMAL HIGH (ref 0.0–2.0)
Acid-Base Excess: 5 mmol/L — ABNORMAL HIGH (ref 0.0–2.0)
Acid-Base Excess: 0 mmol/L (ref 0.0–2.0)
Acid-base deficit: 3 mmol/L — ABNORMAL HIGH (ref 0.0–2.0)
Acid-base deficit: 2 mmol/L (ref 0.0–2.0)
Acid-base deficit: 3 mmol/L — ABNORMAL HIGH (ref 0.0–2.0)
Bicarbonate: 29.3 mmol/L — ABNORMAL HIGH (ref 20.0–28.0)
Bicarbonate: 30.6 mmol/L — ABNORMAL HIGH (ref 20.0–28.0)
Bicarbonate: 27.6 mmol/L (ref 20.0–28.0)
Bicarbonate: 28.3 mmol/L — ABNORMAL HIGH (ref 20.0–28.0)
Bicarbonate: 24.2 mmol/L (ref 20.0–28.0)
Bicarbonate: 22.6 mmol/L (ref 20.0–28.0)
Bicarbonate: 24.6 mmol/L (ref 20.0–28.0)
Bicarbonate: 22.4 mmol/L (ref 20.0–28.0)
Calcium, Ion: 1.21 mmol/L (ref 1.15–1.40)
Calcium, Ion: 1 mmol/L — ABNORMAL LOW (ref 1.15–1.40)
Calcium, Ion: 1.04 mmol/L — ABNORMAL LOW (ref 1.15–1.40)
Calcium, Ion: 0.98 mmol/L — ABNORMAL LOW (ref 1.15–1.40)
Calcium, Ion: 1.27 mmol/L (ref 1.15–1.40)
Calcium, Ion: 1.3 mmol/L (ref 1.15–1.40)
Calcium, Ion: 1.27 mmol/L (ref 1.15–1.40)
Calcium, Ion: 1.21 mmol/L (ref 1.15–1.40)
HCT: 32 % — ABNORMAL LOW (ref 39.0–52.0)
HCT: 27 % — ABNORMAL LOW (ref 39.0–52.0)
HCT: 27 % — ABNORMAL LOW (ref 39.0–52.0)
HCT: 24 % — ABNORMAL LOW (ref 39.0–52.0)
HCT: 23 % — ABNORMAL LOW (ref 39.0–52.0)
HCT: 23 % — ABNORMAL LOW (ref 39.0–52.0)
HCT: 24 % — ABNORMAL LOW (ref 39.0–52.0)
HCT: 24 % — ABNORMAL LOW (ref 39.0–52.0)
Hemoglobin: 10.9 g/dL — ABNORMAL LOW (ref 13.0–17.0)
Hemoglobin: 9.2 g/dL — ABNORMAL LOW (ref 13.0–17.0)
Hemoglobin: 9.2 g/dL — ABNORMAL LOW (ref 13.0–17.0)
Hemoglobin: 8.2 g/dL — ABNORMAL LOW (ref 13.0–17.0)
Hemoglobin: 7.8 g/dL — ABNORMAL LOW (ref 13.0–17.0)
Hemoglobin: 7.8 g/dL — ABNORMAL LOW (ref 13.0–17.0)
Hemoglobin: 8.2 g/dL — ABNORMAL LOW (ref 13.0–17.0)
Hemoglobin: 8.2 g/dL — ABNORMAL LOW (ref 13.0–17.0)
O2 Saturation: 100 %
O2 Saturation: 100 %
O2 Saturation: 100 %
O2 Saturation: 100 %
O2 Saturation: 98 %
O2 Saturation: 100 %
O2 Saturation: 98 %
O2 Saturation: 96 %
Patient temperature: 35.7
Patient temperature: 37.3
Patient temperature: 37
Potassium: 4 mmol/L (ref 3.5–5.1)
Potassium: 4.3 mmol/L (ref 3.5–5.1)
Potassium: 3.8 mmol/L (ref 3.5–5.1)
Potassium: 4.4 mmol/L (ref 3.5–5.1)
Potassium: 4.1 mmol/L (ref 3.5–5.1)
Potassium: 4.1 mmol/L (ref 3.5–5.1)
Potassium: 4.2 mmol/L (ref 3.5–5.1)
Potassium: 4.3 mmol/L (ref 3.5–5.1)
Sodium: 139 mmol/L (ref 135–145)
Sodium: 140 mmol/L (ref 135–145)
Sodium: 136 mmol/L (ref 135–145)
Sodium: 137 mmol/L (ref 135–145)
Sodium: 140 mmol/L (ref 135–145)
Sodium: 142 mmol/L (ref 135–145)
Sodium: 138 mmol/L (ref 135–145)
Sodium: 140 mmol/L (ref 135–145)
TCO2: 31 mmol/L (ref 22–32)
TCO2: 32 mmol/L (ref 22–32)
TCO2: 29 mmol/L (ref 22–32)
TCO2: 29 mmol/L (ref 22–32)
TCO2: 25 mmol/L (ref 22–32)
TCO2: 24 mmol/L (ref 22–32)
TCO2: 26 mmol/L (ref 22–32)
TCO2: 24 mmol/L (ref 22–32)
pCO2 arterial: 47.1 mmHg (ref 32.0–48.0)
pCO2 arterial: 48.2 mmHg — ABNORMAL HIGH (ref 32.0–48.0)
pCO2 arterial: 42.6 mmHg (ref 32.0–48.0)
pCO2 arterial: 34.1 mmHg (ref 32.0–48.0)
pCO2 arterial: 38.1 mmHg (ref 32.0–48.0)
pCO2 arterial: 38.3 mmHg (ref 32.0–48.0)
pCO2 arterial: 48.5 mmHg — ABNORMAL HIGH (ref 32.0–48.0)
pCO2 arterial: 43.4 mmHg (ref 32.0–48.0)
pH, Arterial: 7.402 (ref 7.350–7.450)
pH, Arterial: 7.411 (ref 7.350–7.450)
pH, Arterial: 7.421 (ref 7.350–7.450)
pH, Arterial: 7.528 — ABNORMAL HIGH (ref 7.350–7.450)
pH, Arterial: 7.411 (ref 7.350–7.450)
pH, Arterial: 7.373 (ref 7.350–7.450)
pH, Arterial: 7.314 — ABNORMAL LOW (ref 7.350–7.450)
pH, Arterial: 7.322 — ABNORMAL LOW (ref 7.350–7.450)
pO2, Arterial: 463 mmHg — ABNORMAL HIGH (ref 83.0–108.0)
pO2, Arterial: 444 mmHg — ABNORMAL HIGH (ref 83.0–108.0)
pO2, Arterial: 360 mmHg — ABNORMAL HIGH (ref 83.0–108.0)
pO2, Arterial: 389 mmHg — ABNORMAL HIGH (ref 83.0–108.0)
pO2, Arterial: 111 mmHg — ABNORMAL HIGH (ref 83.0–108.0)
pO2, Arterial: 176 mmHg — ABNORMAL HIGH (ref 83.0–108.0)
pO2, Arterial: 126 mmHg — ABNORMAL HIGH (ref 83.0–108.0)
pO2, Arterial: 91 mmHg (ref 83.0–108.0)

## 2021-04-27 LAB — POCT I-STAT, CHEM 8
BUN: 14 mg/dL (ref 8–23)
BUN: 15 mg/dL (ref 8–23)
BUN: 13 mg/dL (ref 8–23)
BUN: 12 mg/dL (ref 8–23)
BUN: 11 mg/dL (ref 8–23)
BUN: 12 mg/dL (ref 8–23)
Calcium, Ion: 1.18 mmol/L (ref 1.15–1.40)
Calcium, Ion: 1.23 mmol/L (ref 1.15–1.40)
Calcium, Ion: 1.05 mmol/L — ABNORMAL LOW (ref 1.15–1.40)
Calcium, Ion: 1 mmol/L — ABNORMAL LOW (ref 1.15–1.40)
Calcium, Ion: 0.96 mmol/L — ABNORMAL LOW (ref 1.15–1.40)
Calcium, Ion: 1.25 mmol/L (ref 1.15–1.40)
Chloride: 101 mmol/L (ref 98–111)
Chloride: 101 mmol/L (ref 98–111)
Chloride: 102 mmol/L (ref 98–111)
Chloride: 99 mmol/L (ref 98–111)
Chloride: 100 mmol/L (ref 98–111)
Chloride: 102 mmol/L (ref 98–111)
Creatinine, Ser: 0.5 mg/dL — ABNORMAL LOW (ref 0.61–1.24)
Creatinine, Ser: 0.6 mg/dL — ABNORMAL LOW (ref 0.61–1.24)
Creatinine, Ser: 0.5 mg/dL — ABNORMAL LOW (ref 0.61–1.24)
Creatinine, Ser: 0.4 mg/dL — ABNORMAL LOW (ref 0.61–1.24)
Creatinine, Ser: 0.4 mg/dL — ABNORMAL LOW (ref 0.61–1.24)
Creatinine, Ser: 0.5 mg/dL — ABNORMAL LOW (ref 0.61–1.24)
Glucose, Bld: 111 mg/dL — ABNORMAL HIGH (ref 70–99)
Glucose, Bld: 95 mg/dL (ref 70–99)
Glucose, Bld: 129 mg/dL — ABNORMAL HIGH (ref 70–99)
Glucose, Bld: 135 mg/dL — ABNORMAL HIGH (ref 70–99)
Glucose, Bld: 133 mg/dL — ABNORMAL HIGH (ref 70–99)
Glucose, Bld: 122 mg/dL — ABNORMAL HIGH (ref 70–99)
HCT: 34 % — ABNORMAL LOW (ref 39.0–52.0)
HCT: 33 % — ABNORMAL LOW (ref 39.0–52.0)
HCT: 29 % — ABNORMAL LOW (ref 39.0–52.0)
HCT: 26 % — ABNORMAL LOW (ref 39.0–52.0)
HCT: 25 % — ABNORMAL LOW (ref 39.0–52.0)
HCT: 22 % — ABNORMAL LOW (ref 39.0–52.0)
Hemoglobin: 11.6 g/dL — ABNORMAL LOW (ref 13.0–17.0)
Hemoglobin: 11.2 g/dL — ABNORMAL LOW (ref 13.0–17.0)
Hemoglobin: 9.9 g/dL — ABNORMAL LOW (ref 13.0–17.0)
Hemoglobin: 8.8 g/dL — ABNORMAL LOW (ref 13.0–17.0)
Hemoglobin: 8.5 g/dL — ABNORMAL LOW (ref 13.0–17.0)
Hemoglobin: 7.5 g/dL — ABNORMAL LOW (ref 13.0–17.0)
Potassium: 4.1 mmol/L (ref 3.5–5.1)
Potassium: 4.2 mmol/L (ref 3.5–5.1)
Potassium: 3.9 mmol/L (ref 3.5–5.1)
Potassium: 4 mmol/L (ref 3.5–5.1)
Potassium: 4.6 mmol/L (ref 3.5–5.1)
Potassium: 4.1 mmol/L (ref 3.5–5.1)
Sodium: 140 mmol/L (ref 135–145)
Sodium: 139 mmol/L (ref 135–145)
Sodium: 139 mmol/L (ref 135–145)
Sodium: 137 mmol/L (ref 135–145)
Sodium: 137 mmol/L (ref 135–145)
Sodium: 139 mmol/L (ref 135–145)
TCO2: 26 mmol/L (ref 22–32)
TCO2: 28 mmol/L (ref 22–32)
TCO2: 28 mmol/L (ref 22–32)
TCO2: 26 mmol/L (ref 22–32)
TCO2: 28 mmol/L (ref 22–32)
TCO2: 24 mmol/L (ref 22–32)

## 2021-04-27 LAB — GLUCOSE, CAPILLARY
Glucose-Capillary: 117 mg/dL — ABNORMAL HIGH (ref 70–99)
Glucose-Capillary: 177 mg/dL — ABNORMAL HIGH (ref 70–99)
Glucose-Capillary: 177 mg/dL — ABNORMAL HIGH (ref 70–99)
Glucose-Capillary: 161 mg/dL — ABNORMAL HIGH (ref 70–99)
Glucose-Capillary: 148 mg/dL — ABNORMAL HIGH (ref 70–99)
Glucose-Capillary: 141 mg/dL — ABNORMAL HIGH (ref 70–99)
Glucose-Capillary: 149 mg/dL — ABNORMAL HIGH (ref 70–99)
Glucose-Capillary: 132 mg/dL — ABNORMAL HIGH (ref 70–99)
Glucose-Capillary: 133 mg/dL — ABNORMAL HIGH (ref 70–99)

## 2021-04-27 LAB — MAGNESIUM: Magnesium: 2.8 mg/dL — ABNORMAL HIGH (ref 1.7–2.4)

## 2021-04-27 LAB — ECHO INTRAOPERATIVE TEE
AR max vel: 0.67 cm2
AV Area VTI: 0.66 cm2
AV Area mean vel: 0.66 cm2
AV Mean grad: 46.5 mmHg
AV Peak grad: 65.8 mmHg
Ao pk vel: 4.06 m/s
Height: 69 in
S' Lateral: 2.7 cm
Weight: 2816 oz

## 2021-04-27 LAB — PROTIME-INR
INR: 1.7 — ABNORMAL HIGH (ref 0.8–1.2)
Prothrombin Time: 20.2 seconds — ABNORMAL HIGH (ref 11.4–15.2)

## 2021-04-27 LAB — PLATELET COUNT: Platelets: 90 10*3/uL — ABNORMAL LOW (ref 150–400)

## 2021-04-27 LAB — PREPARE RBC (CROSSMATCH)

## 2021-04-27 LAB — APTT: aPTT: 43 seconds — ABNORMAL HIGH (ref 24–36)

## 2021-04-27 LAB — HEMOGLOBIN AND HEMATOCRIT, BLOOD
HCT: 24.1 % — ABNORMAL LOW (ref 39.0–52.0)
Hemoglobin: 8.1 g/dL — ABNORMAL LOW (ref 13.0–17.0)

## 2021-04-27 SURGERY — REPLACEMENT, AORTIC VALVE, OPEN
Anesthesia: General | Site: Chest | Laterality: Right

## 2021-04-27 MED ORDER — MIDAZOLAM HCL 2 MG/2ML IJ SOLN
INTRAMUSCULAR | Status: AC
  Filled 2021-04-27: qty 2

## 2021-04-27 MED ORDER — BISACODYL 10 MG RE SUPP: 10.0000 mg | Freq: Every day | RECTAL | Status: DC

## 2021-04-27 MED ORDER — CHLORHEXIDINE GLUCONATE 4 % EX LIQD: 30.0000 mL | CUTANEOUS | Status: DC

## 2021-04-27 MED ORDER — SODIUM CHLORIDE 0.9 % IV SOLN
250.0000 mL | INTRAVENOUS | Status: DC
  Administered 2021-04-28: 250 mL via INTRAVENOUS

## 2021-04-27 MED ORDER — DEXMEDETOMIDINE HCL IN NACL 400 MCG/100ML IV SOLN
0.0000 ug/kg/h | INTRAVENOUS | Status: DC
Start: 2021-04-27 — End: 2021-04-28

## 2021-04-27 MED ORDER — LACTATED RINGERS IV SOLN: INTRAVENOUS | Status: DC

## 2021-04-27 MED ORDER — NOREPINEPHRINE 4 MG/250ML-% IV SOLN: 0.0000 ug/min | INTRAVENOUS | Status: DC

## 2021-04-27 MED ORDER — HEMOSTATIC AGENTS (NO CHARGE) OPTIME
TOPICAL | Status: DC | PRN
  Administered 2021-04-27: 1 via TOPICAL

## 2021-04-27 MED ORDER — LACTATED RINGERS IV SOLN: 500.0000 mL | Freq: Once | INTRAVENOUS | Status: DC | PRN

## 2021-04-27 MED ORDER — CHLORHEXIDINE GLUCONATE 0.12 % MT SOLN
15.0000 mL | Freq: Once | OROMUCOSAL | Status: AC
  Administered 2021-04-27: 15 mL via OROMUCOSAL
  Filled 2021-04-27: qty 15

## 2021-04-27 MED ORDER — METOPROLOL TARTRATE 12.5 MG HALF TABLET
12.5000 mg | ORAL_TABLET | Freq: Once | ORAL | Status: AC
  Administered 2021-04-27: 12.5 mg via ORAL
  Filled 2021-04-27: qty 1

## 2021-04-27 MED ORDER — PHENYLEPHRINE HCL-NACL 20-0.9 MG/250ML-% IV SOLN: 0.0000 ug/min | INTRAVENOUS | Status: DC

## 2021-04-27 MED ORDER — MORPHINE SULFATE (PF) 2 MG/ML IV SOLN
1.0000 mg | INTRAVENOUS | Status: DC | PRN
  Administered 2021-04-27 – 2021-04-29 (×3): 2 mg via INTRAVENOUS
  Filled 2021-04-27 (×3): qty 1

## 2021-04-27 MED ORDER — SALINE 0.9 % NA AERS: 1.0000 | INHALATION_SPRAY | Freq: Two times a day (BID) | NASAL | Status: DC

## 2021-04-27 MED ORDER — MIDAZOLAM HCL (PF) 10 MG/2ML IJ SOLN
INTRAMUSCULAR | Status: AC
  Filled 2021-04-27: qty 2

## 2021-04-27 MED ORDER — SODIUM CHLORIDE 0.45 % IV SOLN: INTRAVENOUS | Status: DC | PRN

## 2021-04-27 MED ORDER — SODIUM CHLORIDE 0.9 % IV BOLUS
250.0000 mL | INTRAVENOUS | Status: DC | PRN
  Administered 2021-04-27 (×3): 250 mL via INTRAVENOUS

## 2021-04-27 MED ORDER — ALLOPURINOL 300 MG PO TABS
300.0000 mg | ORAL_TABLET | Freq: Every morning | ORAL | Status: DC
  Administered 2021-04-28 – 2021-05-03 (×6): 300 mg via ORAL
  Filled 2021-04-27 (×6): qty 1

## 2021-04-27 MED ORDER — ACETAMINOPHEN 500 MG PO TABS
1000.0000 mg | ORAL_TABLET | Freq: Four times a day (QID) | ORAL | Status: AC
  Administered 2021-04-28 – 2021-05-02 (×17): 1000 mg via ORAL
  Filled 2021-04-27 (×18): qty 2

## 2021-04-27 MED ORDER — DEXAMETHASONE SODIUM PHOSPHATE 10 MG/ML IJ SOLN
INTRAMUSCULAR | Status: DC | PRN
  Administered 2021-04-27: 10 mg via INTRAVENOUS

## 2021-04-27 MED ORDER — INSULIN REGULAR(HUMAN) IN NACL 100-0.9 UT/100ML-% IV SOLN: INTRAVENOUS | Status: DC

## 2021-04-27 MED ORDER — PHENYLEPHRINE 40 MCG/ML (10ML) SYRINGE FOR IV PUSH (FOR BLOOD PRESSURE SUPPORT)
PREFILLED_SYRINGE | INTRAVENOUS | Status: DC | PRN
  Administered 2021-04-27 (×2): 80 ug via INTRAVENOUS
  Administered 2021-04-27: 40 ug via INTRAVENOUS

## 2021-04-27 MED ORDER — ONDANSETRON HCL 4 MG/2ML IJ SOLN
INTRAMUSCULAR | Status: DC | PRN
  Administered 2021-04-27: 4 mg via INTRAVENOUS

## 2021-04-27 MED ORDER — SODIUM CHLORIDE 0.9 % IV SOLN: INTRAVENOUS | Status: DC | PRN

## 2021-04-27 MED ORDER — CEFAZOLIN SODIUM-DEXTROSE 2-3 GM-%(50ML) IV SOLR
INTRAVENOUS | Status: DC | PRN
  Administered 2021-04-27 (×2): 2 g via INTRAVENOUS

## 2021-04-27 MED ORDER — MIDAZOLAM HCL 2 MG/2ML IJ SOLN: 2.0000 mg | INTRAMUSCULAR | Status: DC | PRN

## 2021-04-27 MED ORDER — CEFAZOLIN SODIUM-DEXTROSE 2-4 GM/100ML-% IV SOLN
2.0000 g | Freq: Three times a day (TID) | INTRAVENOUS | Status: AC
  Administered 2021-04-27 – 2021-04-29 (×6): 2 g via INTRAVENOUS
  Filled 2021-04-27 (×6): qty 100

## 2021-04-27 MED ORDER — METOPROLOL TARTRATE 5 MG/5ML IV SOLN: 2.5000 mg | INTRAVENOUS | Status: DC | PRN

## 2021-04-27 MED ORDER — POLYETHYL GLYCOL-PROPYL GLYCOL 0.4-0.3 % OP GEL: Freq: Two times a day (BID) | OPHTHALMIC | Status: DC

## 2021-04-27 MED ORDER — THROMBIN (RECOMBINANT) 20000 UNITS EX SOLR
CUTANEOUS | Status: AC
  Filled 2021-04-27: qty 20000

## 2021-04-27 MED ORDER — ORAL CARE MOUTH RINSE
15.0000 mL | OROMUCOSAL | Status: DC
  Administered 2021-04-27 – 2021-04-28 (×2): 15 mL via OROMUCOSAL

## 2021-04-27 MED ORDER — NITROGLYCERIN IN D5W 200-5 MCG/ML-% IV SOLN: 0.0000 ug/min | INTRAVENOUS | Status: DC

## 2021-04-27 MED ORDER — TRIAMCINOLONE ACETONIDE 55 MCG/ACT NA AERO
1.0000 | INHALATION_SPRAY | Freq: Every morning | NASAL | Status: DC
  Administered 2021-04-28 – 2021-05-03 (×6): 1 via NASAL
  Filled 2021-04-27: qty 10.8

## 2021-04-27 MED ORDER — DEXTROSE 50 % IV SOLN: 0.0000 mL | INTRAVENOUS | Status: DC | PRN

## 2021-04-27 MED ORDER — ACETAMINOPHEN 160 MG/5ML PO SOLN
1000.0000 mg | Freq: Four times a day (QID) | ORAL | Status: AC
Start: 2021-04-28 — End: 2021-05-02

## 2021-04-27 MED ORDER — ROCURONIUM BROMIDE 10 MG/ML (PF) SYRINGE
PREFILLED_SYRINGE | INTRAVENOUS | Status: DC | PRN
Start: 2021-04-27 — End: 2021-04-27
  Administered 2021-04-27 (×2): 50 mg via INTRAVENOUS
  Administered 2021-04-27: 100 mg via INTRAVENOUS
  Administered 2021-04-27: 50 mg via INTRAVENOUS

## 2021-04-27 MED ORDER — SODIUM CHLORIDE 0.9% FLUSH
10.0000 mL | Freq: Two times a day (BID) | INTRAVENOUS | Status: DC
Start: 2021-04-27 — End: 2021-05-03
  Administered 2021-04-27 – 2021-04-29 (×3): 10 mL
  Administered 2021-04-29: 20 mL
  Administered 2021-04-30 – 2021-05-01 (×3): 10 mL
  Administered 2021-05-01: 20 mL

## 2021-04-27 MED ORDER — 0.9 % SODIUM CHLORIDE (POUR BTL) OPTIME
TOPICAL | Status: DC | PRN
  Administered 2021-04-27: 5000 mL

## 2021-04-27 MED ORDER — SALINE SPRAY 0.65 % NA SOLN
1.0000 | Freq: Two times a day (BID) | NASAL | Status: DC
  Administered 2021-04-27 – 2021-05-03 (×13): 1 via NASAL
  Filled 2021-04-27: qty 44

## 2021-04-27 MED ORDER — PHENYLEPHRINE 40 MCG/ML (10ML) SYRINGE FOR IV PUSH (FOR BLOOD PRESSURE SUPPORT)
PREFILLED_SYRINGE | INTRAVENOUS | Status: AC
  Filled 2021-04-27: qty 10

## 2021-04-27 MED ORDER — LACTATED RINGERS IV SOLN: INTRAVENOUS | Status: DC | PRN

## 2021-04-27 MED ORDER — ALBUMIN HUMAN 5 % IV SOLN: INTRAVENOUS | Status: DC | PRN

## 2021-04-27 MED ORDER — OXYCODONE HCL 5 MG PO TABS
5.0000 mg | ORAL_TABLET | ORAL | Status: DC | PRN
  Administered 2021-04-27 – 2021-04-28 (×3): 10 mg via ORAL
  Administered 2021-04-30: 5 mg via ORAL
  Filled 2021-04-27 (×2): qty 2
  Filled 2021-04-27: qty 1
  Filled 2021-04-27: qty 2

## 2021-04-27 MED ORDER — FENTANYL CITRATE (PF) 250 MCG/5ML IJ SOLN
INTRAMUSCULAR | Status: AC
  Filled 2021-04-27: qty 25

## 2021-04-27 MED ORDER — POLYVINYL ALCOHOL 1.4 % OP SOLN
1.0000 [drp] | Freq: Two times a day (BID) | OPHTHALMIC | Status: DC
  Administered 2021-04-27 – 2021-05-03 (×11): 1 [drp] via OPHTHALMIC
  Filled 2021-04-27: qty 15

## 2021-04-27 MED ORDER — PROPOFOL 10 MG/ML IV BOLUS
INTRAVENOUS | Status: DC | PRN
  Administered 2021-04-27: 50 mg via INTRAVENOUS

## 2021-04-27 MED ORDER — ASPIRIN 81 MG PO CHEW: 324.0000 mg | CHEWABLE_TABLET | Freq: Every day | ORAL | Status: DC

## 2021-04-27 MED ORDER — ONDANSETRON HCL 4 MG/2ML IJ SOLN
4.0000 mg | Freq: Four times a day (QID) | INTRAMUSCULAR | Status: DC | PRN
Start: 2021-04-27 — End: 2021-05-03

## 2021-04-27 MED ORDER — CHLORHEXIDINE GLUCONATE 0.12% ORAL RINSE (MEDLINE KIT)
15.0000 mL | Freq: Two times a day (BID) | OROMUCOSAL | Status: DC
  Administered 2021-04-27 – 2021-04-30 (×5): 15 mL via OROMUCOSAL

## 2021-04-27 MED ORDER — THROMBIN 20000 UNITS EX SOLR
CUTANEOUS | Status: DC | PRN
  Administered 2021-04-27: 20000 [IU] via TOPICAL

## 2021-04-27 MED ORDER — ~~LOC~~ CARDIAC SURGERY, PATIENT & FAMILY EDUCATION
Freq: Once | Status: DC
  Filled 2021-04-27: qty 1

## 2021-04-27 MED ORDER — POTASSIUM CHLORIDE 10 MEQ/50ML IV SOLN: 10.0000 meq | INTRAVENOUS | Status: AC

## 2021-04-27 MED ORDER — BISACODYL 5 MG PO TBEC
10.0000 mg | DELAYED_RELEASE_TABLET | Freq: Every day | ORAL | Status: DC
  Administered 2021-04-28 – 2021-04-29 (×2): 10 mg via ORAL
  Filled 2021-04-27 (×2): qty 2

## 2021-04-27 MED ORDER — ALBUMIN HUMAN 25 % IV SOLN
12.5000 g | INTRAVENOUS | Status: AC | PRN
  Administered 2021-04-27 (×4): 12.5 g via INTRAVENOUS
  Filled 2021-04-27 (×2): qty 50

## 2021-04-27 MED ORDER — THROMBIN 20000 UNITS EX SOLR
OROMUCOSAL | Status: DC | PRN
  Administered 2021-04-27: 20 mL via TOPICAL

## 2021-04-27 MED ORDER — CHLORHEXIDINE GLUCONATE 0.12 % MT SOLN
15.0000 mL | OROMUCOSAL | Status: AC
  Administered 2021-04-27: 15 mL via OROMUCOSAL

## 2021-04-27 MED ORDER — PROPOFOL 10 MG/ML IV BOLUS
INTRAVENOUS | Status: AC
  Filled 2021-04-27: qty 20

## 2021-04-27 MED ORDER — SERTRALINE HCL 100 MG PO TABS
100.0000 mg | ORAL_TABLET | Freq: Every day | ORAL | Status: DC
  Administered 2021-04-28 – 2021-05-02 (×5): 100 mg via ORAL
  Filled 2021-04-27 (×5): qty 1

## 2021-04-27 MED ORDER — PROTAMINE SULFATE 10 MG/ML IV SOLN
INTRAVENOUS | Status: DC | PRN
  Administered 2021-04-27 (×3): 40 mg via INTRAVENOUS
  Administered 2021-04-27: 80 mg via INTRAVENOUS
  Administered 2021-04-27 (×2): 40 mg via INTRAVENOUS

## 2021-04-27 MED ORDER — VANCOMYCIN HCL IN DEXTROSE 1-5 GM/200ML-% IV SOLN
1000.0000 mg | Freq: Once | INTRAVENOUS | Status: AC
  Administered 2021-04-27: 1000 mg via INTRAVENOUS
  Filled 2021-04-27: qty 200

## 2021-04-27 MED ORDER — ACETAMINOPHEN 650 MG RE SUPP
650.0000 mg | Freq: Once | RECTAL | Status: AC
  Administered 2021-04-27: 650 mg via RECTAL

## 2021-04-27 MED ORDER — SODIUM CHLORIDE 0.9% FLUSH: 10.0000 mL | INTRAVENOUS | Status: DC | PRN

## 2021-04-27 MED ORDER — DOCUSATE SODIUM 100 MG PO CAPS
200.0000 mg | ORAL_CAPSULE | Freq: Every day | ORAL | Status: DC
  Administered 2021-04-28 – 2021-05-03 (×4): 200 mg via ORAL
  Filled 2021-04-27 (×5): qty 2

## 2021-04-27 MED ORDER — HEPARIN SODIUM (PORCINE) 1000 UNIT/ML IJ SOLN
INTRAMUSCULAR | Status: AC
  Filled 2021-04-27: qty 1

## 2021-04-27 MED ORDER — HEPARIN SODIUM (PORCINE) 1000 UNIT/ML IJ SOLN
INTRAMUSCULAR | Status: DC | PRN
  Administered 2021-04-27: 28000 [IU] via INTRAVENOUS

## 2021-04-27 MED ORDER — FAMOTIDINE IN NACL 20-0.9 MG/50ML-% IV SOLN
20.0000 mg | Freq: Two times a day (BID) | INTRAVENOUS | Status: AC
  Administered 2021-04-27: 20 mg via INTRAVENOUS
  Filled 2021-04-27: qty 50

## 2021-04-27 MED ORDER — CHLORHEXIDINE GLUCONATE CLOTH 2 % EX PADS
6.0000 | MEDICATED_PAD | Freq: Every day | CUTANEOUS | Status: DC
  Administered 2021-04-27 – 2021-05-02 (×5): 6 via TOPICAL

## 2021-04-27 MED ORDER — MAGNESIUM SULFATE 4 GM/100ML IV SOLN
4.0000 g | Freq: Once | INTRAVENOUS | Status: AC
  Administered 2021-04-27: 4 g via INTRAVENOUS
  Filled 2021-04-27: qty 100

## 2021-04-27 MED ORDER — SODIUM CHLORIDE 0.9 % IV SOLN: INTRAVENOUS | Status: DC

## 2021-04-27 MED ORDER — ASPIRIN EC 325 MG PO TBEC
325.0000 mg | DELAYED_RELEASE_TABLET | Freq: Every day | ORAL | Status: DC
  Administered 2021-04-28 – 2021-05-03 (×6): 325 mg via ORAL
  Filled 2021-04-27 (×6): qty 1

## 2021-04-27 MED ORDER — METOPROLOL TARTRATE 25 MG/10 ML ORAL SUSPENSION: 12.5000 mg | Freq: Two times a day (BID) | ORAL | Status: DC

## 2021-04-27 MED ORDER — CALCIUM CHLORIDE 10 % IV SOLN
INTRAVENOUS | Status: DC | PRN
  Administered 2021-04-27 (×3): 200 mg via INTRAVENOUS

## 2021-04-27 MED ORDER — MIDAZOLAM HCL 5 MG/5ML IJ SOLN
INTRAMUSCULAR | Status: DC | PRN
  Administered 2021-04-27: 1 mg via INTRAVENOUS
  Administered 2021-04-27: 2 mg via INTRAVENOUS
  Administered 2021-04-27 (×2): 3 mg via INTRAVENOUS
  Administered 2021-04-27: 2 mg via INTRAVENOUS
  Administered 2021-04-27: 1 mg via INTRAVENOUS

## 2021-04-27 MED ORDER — PROTAMINE SULFATE 10 MG/ML IV SOLN
INTRAVENOUS | Status: AC
  Filled 2021-04-27: qty 25

## 2021-04-27 MED ORDER — ATORVASTATIN CALCIUM 40 MG PO TABS
40.0000 mg | ORAL_TABLET | Freq: Every evening | ORAL | Status: DC
  Administered 2021-04-28 – 2021-05-02 (×5): 40 mg via ORAL
  Filled 2021-04-27 (×5): qty 1

## 2021-04-27 MED ORDER — METOPROLOL TARTRATE 12.5 MG HALF TABLET
12.5000 mg | ORAL_TABLET | Freq: Two times a day (BID) | ORAL | Status: DC
  Administered 2021-04-28 – 2021-04-29 (×2): 12.5 mg via ORAL
  Filled 2021-04-27 (×4): qty 1

## 2021-04-27 MED ORDER — LIDOCAINE 2% (20 MG/ML) 5 ML SYRINGE
INTRAMUSCULAR | Status: AC
  Filled 2021-04-27: qty 5

## 2021-04-27 MED ORDER — SODIUM CHLORIDE 0.9% IV SOLUTION: Freq: Once | INTRAVENOUS | Status: AC

## 2021-04-27 MED ORDER — SODIUM CHLORIDE 0.9% FLUSH: 3.0000 mL | INTRAVENOUS | Status: DC | PRN

## 2021-04-27 MED ORDER — TAMSULOSIN HCL 0.4 MG PO CAPS
0.4000 mg | ORAL_CAPSULE | Freq: Two times a day (BID) | ORAL | Status: DC
  Administered 2021-04-28 – 2021-05-03 (×11): 0.4 mg via ORAL
  Filled 2021-04-27 (×11): qty 1

## 2021-04-27 MED ORDER — ACETAMINOPHEN 160 MG/5ML PO SOLN: 650.0000 mg | Freq: Once | ORAL | Status: AC

## 2021-04-27 MED ORDER — PANTOPRAZOLE SODIUM 40 MG PO TBEC
40.0000 mg | DELAYED_RELEASE_TABLET | Freq: Every day | ORAL | Status: DC
  Administered 2021-04-29 – 2021-05-03 (×5): 40 mg via ORAL
  Filled 2021-04-27 (×5): qty 1

## 2021-04-27 MED ORDER — TRAMADOL HCL 50 MG PO TABS: 50.0000 mg | ORAL_TABLET | ORAL | Status: DC | PRN

## 2021-04-27 MED ORDER — FENTANYL CITRATE (PF) 250 MCG/5ML IJ SOLN
INTRAMUSCULAR | Status: DC | PRN
  Administered 2021-04-27 (×3): 100 ug via INTRAVENOUS
  Administered 2021-04-27: 200 ug via INTRAVENOUS
  Administered 2021-04-27 (×2): 50 ug via INTRAVENOUS

## 2021-04-27 MED ORDER — ALBUMIN HUMAN 5 % IV SOLN
250.0000 mL | INTRAVENOUS | Status: DC | PRN
Start: 2021-04-27 — End: 2021-04-27

## 2021-04-27 MED ORDER — ROCURONIUM BROMIDE 10 MG/ML (PF) SYRINGE
PREFILLED_SYRINGE | INTRAVENOUS | Status: AC
  Filled 2021-04-27: qty 10

## 2021-04-27 MED ORDER — FINASTERIDE 5 MG PO TABS
5.0000 mg | ORAL_TABLET | Freq: Every day | ORAL | Status: DC
  Administered 2021-04-28 – 2021-05-03 (×6): 5 mg via ORAL
  Filled 2021-04-27 (×6): qty 1

## 2021-04-27 MED ORDER — SODIUM CHLORIDE 0.9% FLUSH
3.0000 mL | Freq: Two times a day (BID) | INTRAVENOUS | Status: DC
  Administered 2021-04-28 – 2021-05-01 (×6): 3 mL via INTRAVENOUS

## 2021-04-27 MED ORDER — PLASMA-LYTE A IV SOLN
INTRAVENOUS | Status: DC | PRN
  Administered 2021-04-27: 1000 mL

## 2021-04-27 SURGICAL SUPPLY — 127 items
ADAPTER CARDIO PERF ANTE/RETRO (ADAPTER) ×5 IMPLANT
BAG DECANTER FOR FLEXI CONT (MISCELLANEOUS) ×5 IMPLANT
BLADE CLIPPER SURG (BLADE) ×5 IMPLANT
BLADE STERNUM SYSTEM 6 (BLADE) ×5 IMPLANT
BLADE SURG 11 STRL SS (BLADE) ×5 IMPLANT
BLADE SURG 15 STRL LF DISP TIS (BLADE) ×4 IMPLANT
BLADE SURG 15 STRL SS (BLADE) ×1
BNDG ELASTIC 4X5.8 VLCR STR LF (GAUZE/BANDAGES/DRESSINGS) ×5 IMPLANT
BNDG ELASTIC 6X5.8 VLCR STR LF (GAUZE/BANDAGES/DRESSINGS) ×5 IMPLANT
BNDG GAUZE ELAST 4 BULKY (GAUZE/BANDAGES/DRESSINGS) ×5 IMPLANT
CANISTER SUCT 3000ML PPV (MISCELLANEOUS) ×5 IMPLANT
CANNULA GUNDRY RCSP 15FR (MISCELLANEOUS) ×5 IMPLANT
CATH HEART VENT LEFT (CATHETERS) ×4 IMPLANT
CATH ROBINSON RED A/P 18FR (CATHETERS) ×15 IMPLANT
CATH THORACIC 28FR (CATHETERS) ×5 IMPLANT
CATH THORACIC 36FR (CATHETERS) ×5 IMPLANT
CATH THORACIC 36FR RT ANG (CATHETERS) ×5 IMPLANT
CLIP TI WIDE RED SMALL 24 (CLIP) ×5 IMPLANT
CLIP VESOCCLUDE MED 24/CT (CLIP) IMPLANT
CLIP VESOCCLUDE SM WIDE 24/CT (CLIP) IMPLANT
CNTNR URN SCR LID CUP LEK RST (MISCELLANEOUS) ×4 IMPLANT
CONT SPEC 4OZ STRL OR WHT (MISCELLANEOUS) ×1
CONTAINER PROTECT SURGISLUSH (MISCELLANEOUS) ×10 IMPLANT
COVER MAYO STAND STRL (DRAPES) ×5 IMPLANT
COVER SURGICAL LIGHT HANDLE (MISCELLANEOUS) ×5 IMPLANT
DERMABOND ADVANCED (GAUZE/BANDAGES/DRESSINGS) ×2
DERMABOND ADVANCED .7 DNX12 (GAUZE/BANDAGES/DRESSINGS) ×8 IMPLANT
DEVICE SUT CK QUICK LOAD INDV (Prosthesis & Implant Heart) ×10 IMPLANT
DEVICE SUT CK QUICK LOAD MINI (Prosthesis & Implant Heart) ×5 IMPLANT
DRAIN JACKSON PRT FLT 10 (DRAIN) ×5 IMPLANT
DRAPE CARDIOVASCULAR INCISE (DRAPES) ×1
DRAPE SRG 135X102X78XABS (DRAPES) ×4 IMPLANT
DRAPE WARM FLUID 44X44 (DRAPES) ×5 IMPLANT
DRSG COVADERM 4X14 (GAUZE/BANDAGES/DRESSINGS) ×5 IMPLANT
ELECT CAUTERY BLADE 6.4 (BLADE) ×5 IMPLANT
ELECT REM PT RETURN 9FT ADLT (ELECTROSURGICAL) ×10
ELECTRODE REM PT RTRN 9FT ADLT (ELECTROSURGICAL) ×8 IMPLANT
EVACUATOR SILICONE 100CC (DRAIN) ×5 IMPLANT
FELT TEFLON 1X6 (MISCELLANEOUS) ×10 IMPLANT
GAUZE 4X4 16PLY ~~LOC~~+RFID DBL (SPONGE) ×5 IMPLANT
GAUZE SPONGE 4X4 12PLY STRL (GAUZE/BANDAGES/DRESSINGS) ×10 IMPLANT
GLOVE SURG ENC MOIS LTX SZ6 (GLOVE) IMPLANT
GLOVE SURG ENC MOIS LTX SZ6.5 (GLOVE) IMPLANT
GLOVE SURG ENC MOIS LTX SZ7 (GLOVE) IMPLANT
GLOVE SURG ENC MOIS LTX SZ7.5 (GLOVE) IMPLANT
GLOVE SURG MICRO LTX SZ7 (GLOVE) ×10 IMPLANT
GLOVE SURG ORTHO LTX SZ7.5 (GLOVE) IMPLANT
GLOVE SURG UNDER POLY LF SZ6 (GLOVE) IMPLANT
GLOVE SURG UNDER POLY LF SZ6.5 (GLOVE) IMPLANT
GLOVE SURG UNDER POLY LF SZ7 (GLOVE) IMPLANT
GOWN STRL REUS W/ TWL LRG LVL3 (GOWN DISPOSABLE) ×32 IMPLANT
GOWN STRL REUS W/ TWL XL LVL3 (GOWN DISPOSABLE) ×4 IMPLANT
GOWN STRL REUS W/TWL LRG LVL3 (GOWN DISPOSABLE) ×8
GOWN STRL REUS W/TWL XL LVL3 (GOWN DISPOSABLE) ×1
HEMOSTAT POWDER SURGIFOAM 1G (HEMOSTASIS) ×15 IMPLANT
HEMOSTAT SURGICEL 2X14 (HEMOSTASIS) ×5 IMPLANT
INSERT FOGARTY 61MM (MISCELLANEOUS) IMPLANT
INSERT FOGARTY XLG (MISCELLANEOUS) ×5 IMPLANT
KIT BASIN OR (CUSTOM PROCEDURE TRAY) ×5 IMPLANT
KIT CATH CPB BARTLE (MISCELLANEOUS) ×5 IMPLANT
KIT SUCTION CATH 14FR (SUCTIONS) ×5 IMPLANT
KIT SUT CK MINI COMBO 4X17 (Prosthesis & Implant Heart) ×5 IMPLANT
KIT TURNOVER KIT B (KITS) ×5 IMPLANT
KIT VASOVIEW HEMOPRO 2 VH 4000 (KITS) ×10 IMPLANT
LINE VENT (MISCELLANEOUS) ×5 IMPLANT
MARKER GRAFT CORONARY BYPASS (MISCELLANEOUS) ×15 IMPLANT
NS IRRIG 1000ML POUR BTL (IV SOLUTION) ×25 IMPLANT
PACK E OPEN HEART (SUTURE) ×5 IMPLANT
PACK OPEN HEART (CUSTOM PROCEDURE TRAY) ×5 IMPLANT
PAD ARMBOARD 7.5X6 YLW CONV (MISCELLANEOUS) ×10 IMPLANT
PAD ELECT DEFIB RADIOL ZOLL (MISCELLANEOUS) ×5 IMPLANT
PENCIL BUTTON HOLSTER BLD 10FT (ELECTRODE) ×10 IMPLANT
POSITIONER HEAD DONUT 9IN (MISCELLANEOUS) ×5 IMPLANT
PUNCH AORTIC ROTATE 4.0MM (MISCELLANEOUS) IMPLANT
PUNCH AORTIC ROTATE 4.5MM 8IN (MISCELLANEOUS) ×5 IMPLANT
PUNCH AORTIC ROTATE 5MM 8IN (MISCELLANEOUS) IMPLANT
SET MPS 3-ND DEL (MISCELLANEOUS) ×5 IMPLANT
SPONGE INTESTINAL PEANUT (DISPOSABLE) IMPLANT
SPONGE T-LAP 18X18 ~~LOC~~+RFID (SPONGE) ×45 IMPLANT
SPONGE T-LAP 4X18 ~~LOC~~+RFID (SPONGE) ×10 IMPLANT
SUPPORT HEART JANKE-BARRON (MISCELLANEOUS) ×5 IMPLANT
SUT BONE WAX W31G (SUTURE) ×5 IMPLANT
SUT EB EXC GRN/WHT 2-0 V-5 (SUTURE) ×10 IMPLANT
SUT ETHIBON EXCEL 2-0 V-5 (SUTURE) ×10 IMPLANT
SUT ETHIBOND 2 0 SH (SUTURE) ×1
SUT ETHIBOND 2 0 SH 36X2 (SUTURE) ×4 IMPLANT
SUT ETHIBOND 2 0 V5 (SUTURE) ×10 IMPLANT
SUT ETHIBOND V-5 VALVE (SUTURE) ×5 IMPLANT
SUT MNCRL AB 4-0 PS2 18 (SUTURE) ×10 IMPLANT
SUT PROLENE 3 0 SH DA (SUTURE) IMPLANT
SUT PROLENE 3 0 SH1 36 (SUTURE) ×5 IMPLANT
SUT PROLENE 4 0 RB 1 (SUTURE) ×4
SUT PROLENE 4 0 SH DA (SUTURE) IMPLANT
SUT PROLENE 4-0 RB1 .5 CRCL 36 (SUTURE) ×16 IMPLANT
SUT PROLENE 5 0 C 1 36 (SUTURE) IMPLANT
SUT PROLENE 6 0 C 1 30 (SUTURE) IMPLANT
SUT PROLENE 7 0 BV 1 (SUTURE) ×5 IMPLANT
SUT PROLENE 7 0 BV1 MDA (SUTURE) ×5 IMPLANT
SUT PROLENE 8 0 BV175 6 (SUTURE) ×5 IMPLANT
SUT SILK  1 MH (SUTURE)
SUT SILK 1 MH (SUTURE) IMPLANT
SUT SILK 2 0 SH (SUTURE) IMPLANT
SUT STEEL 6MS V (SUTURE) ×10 IMPLANT
SUT STEEL STERNAL CCS#1 18IN (SUTURE) IMPLANT
SUT STEEL SZ 6 DBL 3X14 BALL (SUTURE) IMPLANT
SUT VIC AB 1 CTX 36 (SUTURE) ×2
SUT VIC AB 1 CTX36XBRD ANBCTR (SUTURE) ×8 IMPLANT
SUT VIC AB 2-0 CT1 27 (SUTURE) ×2
SUT VIC AB 2-0 CT1 TAPERPNT 27 (SUTURE) ×8 IMPLANT
SUT VIC AB 2-0 CTX 27 (SUTURE) IMPLANT
SUT VIC AB 3-0 SH 27 (SUTURE)
SUT VIC AB 3-0 SH 27X BRD (SUTURE) IMPLANT
SUT VIC AB 3-0 X1 27 (SUTURE) IMPLANT
SUT VICRYL 4-0 PS2 18IN ABS (SUTURE) IMPLANT
SYSTEM SAHARA CHEST DRAIN ATS (WOUND CARE) ×5 IMPLANT
TAPE CLOTH SURG 4X10 WHT LF (GAUZE/BANDAGES/DRESSINGS) ×5 IMPLANT
TAPE PAPER 2X10 WHT MICROPORE (GAUZE/BANDAGES/DRESSINGS) ×5 IMPLANT
TOWEL GREEN STERILE (TOWEL DISPOSABLE) ×5 IMPLANT
TOWEL GREEN STERILE FF (TOWEL DISPOSABLE) ×5 IMPLANT
TRAY FOLEY SLVR 16FR TEMP STAT (SET/KITS/TRAYS/PACK) ×5 IMPLANT
TUBING INSUFFLATION (TUBING) ×5 IMPLANT
TUBING LAP HI FLOW INSUFFLATIO (TUBING) ×5 IMPLANT
UNDERPAD 30X36 HEAVY ABSORB (UNDERPADS AND DIAPERS) ×5 IMPLANT
VALVE AORTIC SZ25 INSP/RESIL (Prosthesis & Implant Heart) ×5 IMPLANT
VENT LEFT HEART 12002 (CATHETERS) ×5
WATER STERILE IRR 1000ML POUR (IV SOLUTION) ×10 IMPLANT
YANKAUER SUCT BULB TIP NO VENT (SUCTIONS) ×5 IMPLANT

## 2021-04-27 NOTE — Anesthesia Preprocedure Evaluation (Signed)
Anesthesia Evaluation  Patient identified by MRN, date of birth, ID band Patient awake    Reviewed: Allergy & Precautions, NPO status , Patient's Chart, lab work & pertinent test results  Airway Mallampati: II  TM Distance: >3 FB Neck ROM: Full    Dental  (+) Missing   Pulmonary neg pulmonary ROS,    Pulmonary exam normal breath sounds clear to auscultation       Cardiovascular + CAD  Normal cardiovascular exam+ Valvular Problems/Murmurs AS  Rhythm:Regular Rate:Normal  ECG: rate 69. Normal sinus rhythm Right bundle branch block  ECHO: Left ventricular ejection fraction, by estimation, is 60 to 65%. The left ventricle has normal function. The left ventricle has no regional wall motion abnormalities. Left ventricular diastolic parameters are indeterminate.  Right ventricular systolic function is normal. The right ventricular size is normal.  The mitral valve is normal in structure. No evidence of mitral valve regurgitation. No evidence of mitral stenosis.  The mean aortic valve gradient has increased from 22 mmHg to 40 mmHg since his previous echo in 2019.  The aortic valve is calcified. There is severe calcifcation of the aortic valve. There is severe thickening of the aortic valve. Aortic valve regurgitation is trivial. Severe aortic valve stenosis.  Aortic dilatation noted. There is moderate dilatation of the ascending aorta, measuring 40 mm.    Neuro/Psych PSYCHIATRIC DISORDERS Anxiety Depression TIACVA, No Residual Symptoms    GI/Hepatic Neg liver ROS, hiatal hernia,   Endo/Other  negative endocrine ROS  Renal/GU negative Renal ROS     Musculoskeletal  (+) Arthritis , Gout   Abdominal   Peds  Hematology HLD   Anesthesia Other Findings AS  CAD  Reproductive/Obstetrics                             Anesthesia Physical Anesthesia Plan  ASA: 4  Anesthesia Plan: General   Post-op Pain  Management:    Induction: Intravenous  PONV Risk Score and Plan: 2 and Ondansetron, Dexamethasone, Midazolam and Treatment may vary due to age or medical condition  Airway Management Planned: Oral ETT  Additional Equipment: Arterial line, CVP, PA Cath, TEE and Ultrasound Guidance Line Placement  Intra-op Plan:   Post-operative Plan: Post-operative intubation/ventilation  Informed Consent: I have reviewed the patients History and Physical, chart, labs and discussed the procedure including the risks, benefits and alternatives for the proposed anesthesia with the patient or authorized representative who has indicated his/her understanding and acceptance.     Dental advisory given  Plan Discussed with: CRNA  Anesthesia Plan Comments:         Anesthesia Quick Evaluation

## 2021-04-27 NOTE — Transfer of Care (Signed)
Immediate Anesthesia Transfer of Care Note  Patient: [facility]  Procedure(s) Performed: AORTIC VALVE REPLACEMENT (AVR) USING INSPIRIS 25MM AORTIC VALVE (Chest) CORONARY ARTERY BYPASS GRAFTING (CABG) X  3 , USING LEFT INTERNAL MAMMARY ARTERY AND RIGHT ENDOSCOPIC GREATER SAPHENOUS VEIN CONDUITS (Chest) TRANSESOPHAGEAL ECHOCARDIOGRAM (TEE) ENDOVEIN HARVEST OF GREATER SAPHENOUS VEIN (Right) APPLICATION OF CELL SAVER  Patient Location: SICU  Anesthesia Type:General  Level of Consciousness: Patient remains intubated per anesthesia plan  Airway & Oxygen Therapy: Patient remains intubated per anesthesia plan and Patient placed on Ventilator (see vital sign flow sheet for setting)  Post-op Assessment: Post -op Vital signs reviewed and stable  Post vital signs: stable  Last Vitals:  Vitals Value Taken Time  BP 88/63 04/27/21 1431  Temp 35.7 C 04/27/21 1435  Pulse 73 04/27/21 1435  Resp 14 04/27/21 1435  SpO2 100 % 04/27/21 1435  Vitals shown include unvalidated device data.  Last Pain:  Vitals:   04/27/21 0621  TempSrc:   PainSc: 0-No pain         Complications: No notable events documented.

## 2021-04-27 NOTE — Op Note (Signed)
CARDIOVASCULAR SURGERY OPERATIVE NOTE  04/27/2021  Surgeon:  Gaye Pollack, MD  First Assistant: Ashley Murrain,  PA-C   Preoperative Diagnosis:  Severe multi-vessel coronary artery disease   Postoperative Diagnosis:  Same   Procedure:  Median Sternotomy Extracorporeal circulation 3.   Coronary artery bypass grafting x 3  Left internal mammary artery graft to the LAD SVG to diagonal SVG to PDA  4.   Endoscopic vein harvest from the right leg  5.   Aortic valve replacement using a 25 mm Edwards INSPIRIS RESILIA pericardial valve   Anesthesia:  General Endotracheal   Clinical History/Surgical Indication:  This 75 year old gentleman has stage D, severe, symptomatic aortic stenosis with New York Heart Association class II symptoms of exertional fatigue and shortness of breath consistent with chronic diastolic congestive heart failure.  He also recently had 1 episode of presyncope.  I have personally reviewed his 2D echocardiogram and cardiac catheterization.  His echo shows a trileaflet aortic valve with severe thickening and calcification.  The mean gradient is 40 mmHg consistent with severe aortic stenosis.  Left ventricular ejection fraction is normal.  His cardiac catheterization shows severe two-vessel coronary disease with a 90% mid RCA stenosis and 75% proximal LAD stenosis.  I agree with the best treatment for this patient is coronary bypass graft surgery and aortic valve replacement.  Given his age I would use a bioprosthetic valve.  I reviewed the cardiac catheterization and echocardiogram studies with him and his wife in detail and answered his questions. I discussed the operative procedure with the patient and his wife including alternatives, benefits and risks; including but not limited to bleeding, blood transfusion, infection, stroke, myocardial infarction, graft failure,  heart block requiring a permanent pacemaker, organ dysfunction, and death.  Okey Regal Klus understands and agrees to proceed.    Preparation:  The patient was seen in the preoperative holding area and the correct patient, correct operation were confirmed with the patient after reviewing the medical record and catheterization. The consent was signed by me. Preoperative antibiotics were given. A pulmonary arterial line and radial arterial line were placed by the anesthesia team. The patient was taken back to the operating room and positioned supine on the operating room table. After being placed under general endotracheal anesthesia by the anesthesia team a foley catheter was placed. The neck, chest, abdomen, and both legs were prepped with betadine soap and solution and draped in the usual sterile manner. A surgical time-out was taken and the correct patient and operative procedure were confirmed with the nursing and anesthesia staff.  TEE: performed by Dr. Roanna Banning. This showed severe AS with a mean gradient of 48 mm Hg, trace AI. Moderate LVH with normal EF. No MR.  Cardiopulmonary Bypass:  A median sternotomy was performed. The pericardium was opened in the midline. Right ventricular function appeared normal. The ascending aorta was of normal size and had no palpable plaque. There were no contraindications to aortic cannulation or cross-clamping. The patient was fully systemically heparinized and the ACT was maintained > 400 sec. The proximal aortic arch was cannulated with a 20 F aortic cannula for arterial inflow. Venous cannulation was performed via the right atrial appendage using a two-staged venous cannula. An antegrade cardioplegia/vent cannula was inserted into the mid-ascending aorta. A retrograde cardioplegia cannula was inserted into the right atrium and advanced into the coronary sinus. A left ventricular vent was placed via the right superior pulmonary vein. Aortic occlusion was performed  with a single cross-clamp. Systemic  cooling to 32 degrees Centigrade and topical cooling of the heart with iced saline were used. Antegrade cold KBC cardioplegia was used to induce diastolic arrest and was then given retrograde at about 60 minute intervals throughout the period of arrest to maintain myocardial temperature at or below 10 degrees centigrade. A temperature probe was inserted into the interventricular septum and an insulating pad was placed in the pericardium.   Left internal mammary artery harvest:  The left side of the sternum was retracted using the Rultract retractor. The left internal mammary artery was harvested as a pedicle graft. All side branches were clipped. It was a medium-sized vessel of good quality with excellent blood flow. It was ligated distally and divided. It was sprayed with topical papaverine solution to prevent vasospasm.   Endoscopic vein harvest:  The right greater saphenous vein was harvested endoscopically through a 2 cm incision medial to the right knee. It was harvested from the upper thigh to below the knee. It was a medium-sized vein of good quality. The side branches were all ligated with 4-0 silk ties.    Coronary arteries:  The coronary arteries were examined.  LAD:  medium caliber vessel with segmental distal plaque. Diagonal was a medium caliber vessel with no distal disease LCX:  Segmental plaque in OM branch and ramus branch. RCA:  diffusely diseased with calcific plaque which extended to the ostium of the PDA. The PDA itself had no disease and was a small to medium caliber vessel.    Grafts:  LIMA to the LAD: 1.75 mm. It was sewn end to side using 8-0 prolene continuous suture. SVG to diagonal:  1.6 mm. It was sewn end to side using 7-0 prolene continuous suture. SVG to PDA :  1.6 mm. It was sewn end to side using 7-0 prolene continuous suture.   The proximal vein graft anastomoses were performed to the mid-ascending aorta using  continuous 6-0 prolene suture. Graft markers were placed around the proximal anastomoses.   Aortic Valve Replacement:   A transverse aortotomy was performed 1 cm above the take-off of the right coronary artery. The native valve was tricuspid with severely calcified leaflets and severe annular calcification. The ostia of the coronary arteries were in normal position and were not obstructed. The native valve leaflets were excised and the annulus was decalcified with rongeurs. Care was taken to remove all particulate debris. The left ventricle was directly inspected for debris and then irrigated with ice saline solution. The annulus was sized and a size 25 mm Edwards INSPIRIS RESILIA pericardial valve was chosen. The model number was 11500A and the serial number was [id number].  While the valve was being prepared 2-0 Ethibond pledgeted horizontal mattress sutures were placed around the annulus with the pledgets in a sub-annular position. The sutures were placed through the sewing ring and the valve lowered into place. The sutures were tied sequentially. The valve seated nicely and the coronary ostia were not obstructed. The prosthetic valve leaflets moved normally and there was no sub-valvular obstruction. The aortotomy was closed using 4-0 Prolene suture in 2 layers with felt strips to reinforce the closure.    Completion:  The patient was rewarmed to 37 degrees Centigrade. Warm retrograde reanimation cardioplegia was given. The clamp was removed from the LIMA pedicle and there was rapid warming of the septum and return of ventricular fibrillation. The crossclamp was removed with a time of 144 minutes. There was spontaneous return of sinus rhythm. The distal and proximal anastomoses were  checked for hemostasis. The position of the grafts was satisfactory. Two temporary epicardial pacing wires were placed on the right atrium and two on the right ventricle. The patient was weaned from CPB without difficulty on no  inotropes. CPB time was 172 minutes. Cardiac output was 5 LPM. TEE showed a normally functioning aortic valve prosthesis with no AI and no paravalvular leak.  LV systolic function was normal. Heparin was fully reversed with protamine and the aortic and venous cannulas removed. Hemostasis was achieved. Mediastinal and left pleural drainage tubes were placed. The sternum was closed with  #6 stainless steel wires. The fascia was closed with continuous # 1 vicryl suture. The subcutaneous tissue was closed with 2-0 vicryl continuous suture. The skin was closed with 3-0 vicryl subcuticular suture. All sponge, needle, and instrument counts were reported correct at the end of the case. Dry sterile dressings were placed over the incisions and around the chest tubes which were connected to pleurevac suction. The patient was then transported to the surgical intensive care unit in stable condition.

## 2021-04-27 NOTE — Progress Notes (Signed)
EVENING ROUNDS NOTE :     [address]       [place] [address]             [phone]                 Day of Surgery Procedure(s) (LRB): AORTIC VALVE REPLACEMENT (AVR) USING INSPIRIS 25MM AORTIC VALVE (N/A) CORONARY ARTERY BYPASS GRAFTING (CABG) X  3 , USING LEFT INTERNAL MAMMARY ARTERY AND RIGHT ENDOSCOPIC GREATER SAPHENOUS VEIN CONDUITS (N/A) TRANSESOPHAGEAL ECHOCARDIOGRAM (TEE) (N/A) ENDOVEIN HARVEST OF GREATER SAPHENOUS VEIN (Right) APPLICATION OF CELL SAVER   Total Length of Stay:  LOS: 0 days  Events:   No events Low CT output Weaning to extubate    BP 102/72   Pulse 90   Temp 98.4 F (36.9 C)   Resp 14   Ht 5\' 9"  (1.753 m)   Wt 79.8 kg   SpO2 100%   BMI 25.99 kg/m   PAP: (7-29)/(-7-20) 25/16 CO:  [2.3 L/min-3.5 L/min] 3.5 L/min CI:  [1.2 L/min/m2-1.8 L/min/m2] 1.8 L/min/m2  Vent Mode: SIMV;PSV;PRVC FiO2 (%):  [50 %] 50 % Set Rate:  [12 bmp] 12 bmp Vt Set:  [560 mL] 560 mL PEEP:  [5 cmH20] 5 cmH20 Pressure Support:  [10 cmH20] 10 cmH20 Plateau Pressure:  [15 cmH20] 15 cmH20   sodium chloride 20 mL/hr at 04/27/21 1655   [START ON 04/28/2021] sodium chloride     sodium chloride 20 mL/hr at 04/27/21 1500   albumin human 12.5 g (04/27/21 1439)   And   sodium chloride 250 mL (04/27/21 1436)    ceFAZolin (ANCEF) IV Stopped (04/27/21 1655)   dexmedetomidine (PRECEDEX) IV infusion 0.3 mcg/kg/hr (04/27/21 1700)   famotidine (PEPCID) IV Stopped (04/27/21 1528)   insulin 2 Units/hr (04/27/21 1700)   lactated ringers     lactated ringers     lactated ringers 20 mL/hr at 04/27/21 1700   magnesium sulfate 20 mL/hr at 04/27/21 1700   nitroGLYCERIN 0 mcg/min (04/27/21 1500)   norepinephrine (LEVOPHED) Adult infusion 6 mcg/min (04/27/21 1700)   phenylephrine (NEO-SYNEPHRINE) Adult infusion Stopped (04/27/21 1512)   vancomycin      No intake/output data recorded.   CBC Latest Ref Rng & Units 04/27/2021 04/27/2021 04/27/2021  WBC 4.0 -  10.5 K/uL - 8.5 -  Hemoglobin 13.0 - 17.0 g/dL 7.8(L) 8.7(L) 7.5(L)  Hematocrit 39.0 - 52.0 % 23.0(L) 27.0(L) 22.0(L)  Platelets 150 - 400 K/uL - 110(L) -    BMP Latest Ref Rng & Units 04/27/2021 04/27/2021 04/27/2021  Glucose 70 - 99 mg/dL - 122(H) -  BUN 8 - 23 mg/dL - 12 -  Creatinine 0.61 - 1.24 mg/dL - 0.50(L) -  BUN/Creat Ratio 10 - 24 - - -  Sodium 135 - 145 mmol/L 142 139 140  Potassium 3.5 - 5.1 mmol/L 4.1 4.1 4.1  Chloride 98 - 111 mmol/L - 102 -  CO2 22 - 32 mmol/L - - -  Calcium 8.9 - 10.3 mg/dL - - -    ABG    Component Value Date/Time   PHART 7.373 04/27/2021 1449   PCO2ART 38.3 04/27/2021 1449   PO2ART 176 (H) 04/27/2021 1449   HCO3 22.6 04/27/2021 1449   TCO2 24 04/27/2021 1449   ACIDBASEDEF 3.0 (H) 04/27/2021 1449   O2SAT 100.0 04/27/2021 1449       Melodie Bouillon, MD 04/27/2021 5:31 PM

## 2021-04-27 NOTE — Brief Op Note (Addendum)
04/27/2021  12:01 PM  PATIENT:  [facility]  74 y.o. male  PRE-OPERATIVE DIAGNOSIS:  1. Severe Aortic Stenosis 2. Coronary Artery Disease  POST-OPERATIVE DIAGNOSIS:  1. Severe Aortic Stenosis 2. Coronary Artery Disease  PROCEDURE:  TRANSESOPHAGEAL ECHOCARDIOGRAM (TEE), CORONARY ARTERY BYPASS GRAFTING (CABG) X  3 (LIMA to LAD, SVG to DIAGONAL, SVG to PDA), USING LEFT INTERNAL MAMMARY ARTERY AND RIGHT ENDOSCOPIC GREATER SAPHENOUS VEIN CONDUITS  (ENDOVEIN HARVEST OF RIGHT THIGH GREATER SAPHENOUS VEIN, AORTIC VALVE REPLACEMENT (AVR) USING INSPIRIS 25MM AORTIC VALVE (using a Bioprosthetic Inspiris, Model 11500A, Serial # [id number], Size 25 mm),  APPLICATION OF CELL SAVER RIGHT EVH HARVEST TIME: 24 minutes;RIGHT EVH PREP TIME: 13 minutes  SURGEON:  Surgeon(s) and Role:    Gaye Pollack, MD - Primary  PHYSICIAN ASSISTANT: Lars Pinks PA-C  ASSISTANTS: Ardyth Man RNFA   ANESTHESIA:   general  EBL:  Per anesthesia, perfusion record  DRAINS:  Chest tubes placed in the mediastinal and pleural spaces    SPECIMEN:  Source of Specimen:  Native AV leaflets  DISPOSITION OF SPECIMEN:  PATHOLOGY  COUNTS CORRECT:  YES  DICTATION: .Dragon Dictation  PLAN OF CARE: Admit to inpatient   PATIENT DISPOSITION:  ICU - intubated and hemodynamically stable.   Delay start of Pharmacological VTE agent (>24hrs) due to surgical blood loss or risk of bleeding: yes  BASELINE WEIGHT: 79.8 kg

## 2021-04-27 NOTE — H&P (Signed)
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  He reports having some shortness of breath with going up stairs or walking up hills for the past several months but remains fairly active.  He really did not think very much about the symptoms.  He underwent repeat echocardiogram on 01/08/2021 showing a calcified and thickened aortic valve with restricted leaflet mobility.  The mean gradient had increased to 40 mmHg compared to his prior echo in 2019 with trivial regurgitation.  Aortic valve area was 0.66 cm.  Left ventricular ejection fraction was 60 to 65%. He had an episode about a month ago where he was picking up sticks in the yard and at one point when he stood up his vision suddenly went black and he leaned against a tree and got himself to the ground.  He did not lose consciousness and his vision came back after a short period of time.  He has had no further episodes like that.  This episode prompted further work-up and he underwent cardiac catheterization on 03/27/2021.  This showed a 75% proximal to mid LAD stenosis and a 90% mid RCA stenosis.  There is about 50% diagonal stenosis.  Right heart pressures were within normal limits.   He is married and lives with his wife.  He continues to remain fairly active and has been riding a  lawnmower a few days per week.  He sees his dentist regularly and has had no dental problems.     Past Medical History:  Diagnosis Date   Anxiety     Aortic stenosis     Arthritis     Depression     Hemorrhoids     Hiatal hernia     High cholesterol     History of kidney stones     Hx of adenomatous polyp of colon 01/31/2017   Numbness and tingling in left hand     PONV (postoperative nausea and vomiting)      bladder spasms went home after neck surgery w/ catheter   Stroke ([place]) 11/09/2013    Tia    TIA (transient ischemic attack)     Urinary frequency             Past Surgical History:  Procedure Laterality Date   ANTERIOR CERVICAL DECOMP/DISCECTOMY FUSION N/A 04/11/2014    Procedure: ANTERIOR CERVICAL DECOMPRESSION/DISCECTOMY FUSION CERVICAL THREE-FOUR,CERVICAL FOUR-FIVE,CERVICAL FIVE-SIX;  Surgeon: Nicholas Levine, MD;  Location: [facility];  Service: Neurosurgery;  Laterality: N/A;   ANTERIOR LAT LUMBAR FUSION Left 02/20/2018    Procedure: Left Lumbar Four-Five Anterolateral lumbar interbody fusion;  Surgeon: Nicholas Levine, MD;  Location: [facility];  Service: Neurosurgery;  Laterality: Left;   BACK SURGERY  COLONOSCOPY       KNEE SURGERY Right     LUMBAR PERCUTANEOUS PEDICLE SCREW 1 LEVEL N/A 02/20/2018    Procedure: Lumbar Percutaneous Pedicle Screw Placment Lumbar four-five;  Surgeon: Nicholas Levine, MD;  Location: [facility];  Service: Neurosurgery;  Laterality: N/A;   RIGHT HEART CATH AND CORONARY ANGIOGRAPHY N/A 03/27/2021    Procedure: RIGHT HEART CATH AND CORONARY ANGIOGRAPHY;  Surgeon: Nicholas Booze, MD;  Location: [facility];  Service: Cardiovascular;  Laterality: N/A;   TONSILLECTOMY       TOTAL KNEE ARTHROPLASTY Right 06/08/2019    Procedure: RIGHT TOTAL KNEE ARTHROPLASTY;  Surgeon: Nicholas Nakayama, MD;  Location: [facility];  Service: Orthopedics;  Laterality: Right;   XI ROBOTIC ASSISTED INGUINAL HERNIA REPAIR WITH MESH Bilateral 11/21/2020    Procedure: ROBOTIC  BILATERAL INGUINAL HERNIA REPAIR WITH MESH;  Surgeon: Nicholas Ok, MD;  Location: [facility];  Service: General;  Laterality: Bilateral;           Family History  Problem Relation Age of Onset   Diabetes Father     Heart disease Father     Colon cancer Neg Hx        Social History Social History         Tobacco Use   Smoking status: Never   Smokeless tobacco: Never  Vaping Use   Vaping Use: Never used  Substance Use Topics   Alcohol use: Yes      Comment: rarely    Drug use: No            Current Outpatient Medications  Medication Sig Dispense Refill   acetaminophen (TYLENOL) 500 MG tablet Take 1,000 mg by mouth as needed for mild pain or headache.       allopurinol (ZYLOPRIM) 300 MG tablet Take 300 mg by mouth daily.       ALPRAZolam (XANAX) 0.5 MG tablet Take 0.25 mg by mouth 2 (two) times daily as needed for anxiety.       APPLE CIDER VINEGAR PO Take 1 tablet by mouth daily.       aspirin EC 81 MG tablet Take 1 tablet (81 mg total) by mouth daily. Swallow whole. 30 tablet 11   atorvastatin (LIPITOR) 40 MG tablet Take 40 mg by mouth every evening.        BLACK ELDERBERRY PO Take 1 tablet by mouth daily.       cholecalciferol (VITAMIN D3) 25 MCG (1000 UNIT) tablet Take 1,000 Units by mouth daily.       clopidogrel (PLAVIX) 75 MG tablet Take 1 tablet (75 mg total) by mouth daily.       finasteride (PROSCAR) 5 MG tablet Take 5 mg by mouth daily.       fluorouracil (EFUDEX) 5 % cream Apply 1 application topically daily as needed (sun damage).       Polyethyl Glycol-Propyl Glycol (SYSTANE OP) Place 1 drop into both eyes in the morning and at bedtime.       potassium citrate (UROCIT-K) 10 MEQ (1080 MG) SR tablet Take 10 mEq by mouth 2 (two) times daily.       Saline (SIMPLY SALINE) 0.9 % AERS Place 1 spray into the nose in the morning, at noon, and at bedtime.       selenium 50 MCG TABS tablet Take 50 mcg by mouth daily.       sertraline (ZOLOFT) 100 MG tablet Take 100 mg by  mouth at bedtime.  tamsulosin (FLOMAX) 0.4 MG CAPS capsule Take 0.4 mg by mouth 2 (two) times daily.       traMADol (ULTRAM) 50 MG tablet Take 1 tablet (50 mg total) by mouth every 6 (six) hours as needed. 20 tablet 0   triamcinolone (NASACORT) 55 MCG/ACT AERO nasal inhaler Place 1 spray into the nose in the morning.       zinc gluconate 50 MG tablet Take 50 mg by mouth daily.        No current facility-administered medications for this visit.      No Known Allergies   Review of Systems  Constitutional:  Positive for activity change and fatigue. Negative for chills and fever.  HENT: Negative.    Eyes: Negative.   Respiratory:  Positive for shortness of breath. Negative for chest tightness.   Cardiovascular:  Negative for chest pain and leg swelling.  Gastrointestinal: Negative.   Endocrine: Negative.   Genitourinary: Negative.   Musculoskeletal:  Positive for arthralgias.  Skin: Negative.   Allergic/Immunologic: Negative.   Neurological:  Positive for dizziness. Negative for syncope.  Hematological:  Bruises/bleeds easily.  Psychiatric/Behavioral:  The patient is nervous/anxious.     BP 119/60   Pulse 90   Resp 20   Ht 5\' 9"  (1.753 m)   Wt 167 lb (75.8 kg)   SpO2 95% Comment: RA  BMI 24.66 kg/m  Physical Exam Constitutional:      Appearance: Normal appearance. He is normal weight.  HENT:     Clink: Normocephalic and atraumatic.  Eyes:     Extraocular Movements: Extraocular movements intact.     Conjunctiva/sclera: Conjunctivae normal.     Pupils: Pupils are equal, round, and reactive to light.  Neck:     Vascular: No carotid bruit.     Comments: 3/6 systolic murmur along the right sternal border.  No diastolic murmur. Cardiovascular:     Rate and Rhythm: Normal rate and regular rhythm.     Pulses: Normal pulses.     Heart sounds: Murmur heard.  Pulmonary:     Effort: Pulmonary effort is normal.     Breath sounds: Normal breath sounds.  Abdominal:      General: Abdomen is flat.     Palpations: Abdomen is soft.  Musculoskeletal:        General: No swelling. Normal range of motion.     Cervical back: Normal range of motion and neck supple.  Skin:    General: Skin is warm and dry.  Neurological:     General: No focal deficit present.     Mental Status: He is alert and oriented to person, place, and time.  Psychiatric:        Mood and Affect: Mood normal.        Behavior: Behavior normal.        Diagnostic Tests:   ECHOCARDIOGRAM REPORT         Patient Name:   Nicholas Wise Jobe Date of Exam: 01/08/2021  Medical Rec #:  [id number]     Height:       67.0 in  Accession #:    [id number]    Weight:       170.9 lb  Date of Birth:  [birth year]    BSA:          1.891 m  Patient Age:    43 years      BP:           134/68 mmHg  Patient Gender: M  HR:           64 bpm.  Exam Location:  [address]   Procedure: 2D Echo, Cardiac Doppler and Color Doppler   Indications:    I35.0 Aortic Stenosis     History:        Patient has prior history of Echocardiogram examinations,  most                  recent 01/05/2018. Risk Factors:Dyslipidemia. Anxiety. TIA.     Sonographer:    Wilford Sports Rodgers-Jones RDCS  Referring Phys: [place]     1. Left ventricular ejection fraction, by estimation, is 60 to 65%. The  left ventricle has normal function. The left ventricle has no regional  wall motion abnormalities. Left ventricular diastolic parameters are  indeterminate.   2. Right ventricular systolic function is normal. The right ventricular  size is normal.   3. The mitral valve is normal in structure. No evidence of mitral valve  regurgitation. No evidence of mitral stenosis.   4. The mean aortic valve gradient has increased from 22 mmHg to 40 mmHg  since his previous echo in 2019.      . The aortic valve is calcified. There is severe calcifcation of the  aortic valve. There is severe thickening of the  aortic valve. Aortic valve  regurgitation is trivial. Severe aortic valve stenosis.   5. Aortic dilatation noted. There is moderate dilatation of the ascending  aorta, measuring 40 mm.   FINDINGS   Left Ventricle: Left ventricular ejection fraction, by estimation, is 60  to 65%. The left ventricle has normal function. The left ventricle has no  regional wall motion abnormalities. The left ventricular internal cavity  size was normal in size. There is   no left ventricular hypertrophy. Left ventricular diastolic parameters  are indeterminate.   Right Ventricle: The right ventricular size is normal. No increase in  right ventricular wall thickness. Right ventricular systolic function is  normal.   Left Atrium: Left atrial size was normal in size.   Right Atrium: Right atrial size was normal in size.   Pericardium: There is no evidence of pericardial effusion.   Mitral Valve: The mitral valve is normal in structure. No evidence of  mitral valve regurgitation. No evidence of mitral valve stenosis.   Tricuspid Valve: The tricuspid valve is normal in structure. Tricuspid  valve regurgitation is trivial. No evidence of tricuspid stenosis.   Aortic Valve: The mean aortic valve gradient has increased from 22 mmHg to  40 mmHg since his previous echo in 2019.  The aortic valve is calcified. There is severe calcifcation of the aortic  valve. There is severe thickening of the aortic valve. Aortic valve  regurgitation is trivial. Aortic regurgitation PHT measures 606 msec.  Severe aortic stenosis is present. Aortic  valve mean gradient measures 40.2 mmHg. Aortic valve peak gradient  measures 59.9 mmHg. Aortic valve area, by VTI measures 0.66 cm.   Pulmonic Valve: The pulmonic valve was grossly normal. Pulmonic valve  regurgitation is not visualized.   Aorta: Aortic dilatation noted. There is moderate dilatation of the  ascending aorta, measuring 40 mm.   IAS/Shunts: The atrial septum  is grossly normal.      LEFT VENTRICLE  PLAX 2D  LVIDd:         4.00 cm  Diastology  LVIDs:         2.80 cm  LV e' medial:  7.83 cm/s  LV PW:         1.30 cm  LV E/e' medial:  13.0  LV IVS:        0.80 cm  LV e' lateral:   5.84 cm/s  LVOT diam:     2.30 cm  LV E/e' lateral: 17.5  LV SV:         63  LV SV Index:   33  LVOT Area:     4.15 cm      RIGHT VENTRICLE  RV Basal diam:  3.90 cm  RV S prime:     15.47 cm/s  TAPSE (M-mode): 1.8 cm   LEFT ATRIUM             Index       RIGHT ATRIUM          Index  LA diam:        3.70 cm 1.96 cm/m  RA Area:     9.50 cm  LA Vol (A2C):   41.6 ml 22.00 ml/m RA Volume:   20.60 ml 10.89 ml/m  LA Vol (A4C):   37.0 ml 19.56 ml/m  LA Biplane Vol: 42.2 ml 22.31 ml/m   AORTIC VALVE  AV Area (Vmax):    0.70 cm  AV Area (Vmean):   0.62 cm  AV Area (VTI):     0.66 cm  AV Vmax:           387.00 cm/s  AV Vmean:          297.200 cm/s  AV VTI:            0.950 m  AV Peak Grad:      59.9 mmHg  AV Mean Grad:      40.2 mmHg  LVOT Vmax:         65.30 cm/s  LVOT Vmean:        44.350 cm/s  LVOT VTI:          0.152 m  LVOT/AV VTI ratio: 0.16  AI PHT:            606 msec     AORTA  Ao Root diam: 3.60 cm  Ao Asc diam:  4.00 cm   MITRAL VALVE  MV Area (PHT): 2.50 cm     SHUNTS  MV Decel Time: 303 msec     Systemic VTI:  0.15 m  MV E velocity: 102.00 cm/s  Systemic Diam: 2.30 cm  MV A velocity: 106.00 cm/s  MV E/A ratio:  0.96   Mertie Moores MD  Electronically signed by Mertie Moores MD  Signature Date/Time: 01/08/2021/1:16:43 PM         Final       Physicians   Panel Physicians Referring Physician Case Authorizing Physician  Nicholas Booze, MD (Primary)        Procedures   RIGHT HEART CATH AND CORONARY ANGIOGRAPHY    Conclusion       Ost LM lesion is 25% stenosed.   Prox LAD to Mid LAD lesion is 75% stenosed.   Mid RCA lesion is 90% stenosed.   1st Diag lesion is 50% stenosed.   Mid LAD lesion is 25% stenosed.    Severe aortic stenosis by echo.   Right Heart: Ao sat 92%, PA sat 71%, mean PA pressure 12 mmHg, mean PCW P 6 mmHg; cardiac output 6.7 L/min, cardiac index 3.5   Add aspirin 81 mg daily.   Cardiac surgery referral for CABG/AVR eval.  Indications   Severe aortic stenosis [I35.0 (ICD-10-CM)]    Procedural Details   Technical Details The risks, benefits, and details of the procedure were explained to the patient.  The patient verbalized understanding and wanted to proceed.  Informed written consent was obtained.  PROCEDURE TECHNIQUE:  After Xylocaine anesthesia, a 5 French sheath was placed in the right antecubital area in exchange for a peripheral IV. A 5 French balloontipped Swan-Ganz catheter was advanced to the pulmonary artery under fluoroscopic guidance. Hemodynamic pressures were obtained. Oxygen saturations were obtained. After Xylocaine anesthesia, a 72F sheath was placed in the right radial artery with a single anterior needle wall stick.  Right coronary angiography was done using a Judkins R4 guide catheter.  Left coronary angiography was done using a Judkins L3.5 guide catheter.   Left heart cath was attempted, but unable to cross artic valve with straight wire and AL1.      Contrast: 60 cc   Estimated blood loss <50 mL.   During this procedure medications were administered to achieve and maintain moderate conscious sedation while the patient's heart rate, blood pressure, and oxygen saturation were continuously monitored and I was present face-to-face 100% of this time.    Medications (Filter: Administrations occurring from 3818 to 0952 on 03/27/21)  important  Continuous medications are totaled by the amount administered until 03/27/21 0952.    fentaNYL (SUBLIMAZE) injection (mcg) Total dose:  25 mcg Date/Time Rate/Dose/Volume Action    03/27/21 0858 25 mcg Given      midazolam (VERSED) injection (mg) Total dose:  1 mg Date/Time Rate/Dose/Volume Action    03/27/21  0858 1 mg Given      Heparin (Porcine) in NaCl 1000-0.9 UT/500ML-% SOLN (mL) Total volume:  1,000 mL Date/Time Rate/Dose/Volume Action    03/27/21 0858 500 mL Given    0858 500 mL Given      lidocaine (PF) (XYLOCAINE) 1 % injection (mL) Total volume:  4 mL Date/Time Rate/Dose/Volume Action    03/27/21 0906 2 mL Given    0907 2 mL Given      heparin sodium (porcine) injection (Units) Total dose:  4,000 Units Date/Time Rate/Dose/Volume Action    03/27/21 0918 4,000 Units Given      iohexol (OMNIPAQUE) 350 MG/ML injection (mL) Total volume:  60 mL Date/Time Rate/Dose/Volume Action    03/27/21 0937 60 mL Given      Radial Cocktail/Verapamil only (mL) Total volume:  10 mL Date/Time Rate/Dose/Volume Action    03/27/21 0910 10 mL Given      0.9 %  sodium chloride infusion (mL/hr) Total volume:  0.69 mL Dosing weight:  76 Date/Time Rate/Dose/Volume Action    03/27/21 0952 75 mL/hr Rate/Dose Change      Sedation Time   Sedation Time Physician-1: 38 minutes 11 seconds Contrast   Medication Name Total Dose  iohexol (OMNIPAQUE) 350 MG/ML injection 60 mL    Radiation/Fluoro   Fluoro time: 8.3 (min) DAP: 20476 (mGycm2) Cumulative Air Kerma: 393 (mGy) Coronary Findings   Diagnostic Dominance: Right Left Main  Ost LM lesion is 25% stenosed.  Left Anterior Descending  The vessel exhibits minimal luminal irregularities.  Prox LAD to Mid LAD lesion is 75% stenosed.  Mid LAD lesion is 25% stenosed.  First Diagonal Branch  1st Diag lesion is 50% stenosed.  Left Circumflex  The vessel exhibits minimal luminal irregularities.  Right Coronary Artery  Vessel is large.  Mid RCA lesion is 90% stenosed.  Intervention   No interventions have  been documented. Right Heart   Right Heart Pressures Ao sat 92%, PA sat 71%, mean PA pressure 12 mmHg, mean PCW P 6 mmHg; cardiac output 6.7 L/min, cardiac index 3.5    Left Heart   Aortic Valve The aortic valve is calcified.     Coronary Diagrams   Diagnostic Dominance: Right Intervention   Implants      No implant documentation for this case.    Syng[employer] Images    Show images for CARDIAC CATHETERIZATION Images on Long Term Storage    Show images for Buttery, [employer] to Procedure Log   Procedure Log    Hemo Data   Flowsheet Row Most Recent Value  Fick Cardiac Output 6.76 L/min  Fick Cardiac Output Index 3.53 (L/min)/BSA  RA A Wave 3 mmHg  RA V Wave 2 mmHg  RA Mean 1 mmHg  RV Systolic Pressure 24 mmHg  RV Diastolic Pressure 0 mmHg  RV EDP 4 mmHg  PA Systolic Pressure 22 mmHg  PA Diastolic Pressure 5 mmHg  PA Mean 12 mmHg  PW A Wave 9 mmHg  PW V Wave 8 mmHg  PW Mean 6 mmHg  AO Systolic Pressure 470 mmHg  AO Diastolic Pressure 55 mmHg  AO Mean 78 mmHg  QP/QS 1  TPVR Index 3.4 HRUI  TSVR Index 22.11 HRUI  PVR SVR Ratio 0.08  TPVR/TSVR Ratio 0.15      Impression:   This 75 year old gentleman has stage D, severe, symptomatic aortic stenosis with New York Heart Association class II symptoms of exertional fatigue and shortness of breath consistent with chronic diastolic congestive heart failure.  He also recently had 1 episode of presyncope.  I have personally reviewed his 2D echocardiogram and cardiac catheterization.  His echo shows a trileaflet aortic valve with severe thickening and calcification.  The mean gradient is 40 mmHg consistent with severe aortic stenosis.  Left ventricular ejection fraction is normal.  His cardiac catheterization shows severe two-vessel coronary disease with a 90% mid RCA stenosis and 75% proximal LAD stenosis.  I agree with the best treatment for this patient is coronary bypass graft surgery and aortic valve replacement.  Given his age I would use a bioprosthetic valve.  I reviewed the cardiac catheterization and echocardiogram studies with him and his wife in detail and answered his questions. I discussed the operative procedure with the patient and his wife  including alternatives, benefits and risks; including but not limited to bleeding, blood transfusion, infection, stroke, myocardial infarction, graft failure, heart block requiring a permanent pacemaker, organ dysfunction, and death.  Nicholas Wise understands and agrees to proceed.     Plan:   Aortic valve replacement and coronary bypass graft surgery.            Gaye Pollack, MD [facility] [phone]

## 2021-04-27 NOTE — Discharge Instructions (Signed)

## 2021-04-27 NOTE — Anesthesia Procedure Notes (Signed)
Procedure Name: Intubation Date/Time: 04/27/2021 7:46 AM Performed by: Lavell Luster, CRNA Pre-anesthesia Checklist: Patient identified, Emergency Drugs available, Suction available and Patient being monitored Patient Re-evaluated:Patient Re-evaluated prior to induction Oxygen Delivery Method: Circle system utilized Preoxygenation: Pre-oxygenation with 100% oxygen Induction Type: IV induction Ventilation: Mask ventilation without difficulty Laryngoscope Size: Mac and 4 Grade View: Grade I Tube type: Oral Tube size: 7.5 mm Number of attempts: 1 Airway Equipment and Method: Stylet Placement Confirmation: ETT inserted through vocal cords under direct vision, positive ETCO2 and breath sounds checked- equal and bilateral Secured at: 22 cm Tube secured with: Tape Dental Injury: Teeth and Oropharynx as per pre-operative assessment

## 2021-04-27 NOTE — Discharge Summary (Signed)
Physician Discharge Summary       [address]       [place] [address]             [phone]    Patient ID: Nicholas Wise MRN: [id number] DOB/AGE: [id number] y.o.  Admit date: 04/27/2021 Discharge date: 05/03/2021  Admission Diagnoses: Severe aortic stenosis 2. Coronary artery disease  Discharge Diagnoses:  1.  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 Expected post op blood loss anemia 3. History of the following: Arthritis      Depression     Hemorrhoids     Hiatal hernia     High cholesterol     History of kidney stones     Hx of adenomatous polyp of colon 01/31/2017   Numbness and tingling in left hand     PONV (postoperative nausea and vomiting)      bladder spasms went home after neck surgery w/ catheter   Stroke ([place]) 11/09/2013    Tia    TIA (transient ischemic attack)     Urinary frequency     Arthritis    Consults: None  Procedure (s):  Median Sternotomy Extracorporeal circulation 3.   Coronary artery bypass grafting x 3   Left internal mammary artery graft to the LAD SVG to diagonal SVG to PDA   4.   Endoscopic vein harvest from the right leg   5.   Aortic valve replacement using a 25 mm Edwards INSPIRIS RESILIA pericardial valve by Dr. Cyndia Bent on 04/27/2021.   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  He reports having some shortness of breath with going up stairs or walking up hills for the past several months but remains fairly active.  He really did not think very much about the symptoms.  He underwent repeat echocardiogram on 01/08/2021 showing a calcified and  thickened aortic valve with restricted leaflet mobility.  The mean gradient had increased to 40 mmHg compared to his prior echo in 2019 with trivial regurgitation.  Aortic valve area was 0.66 cm.  Left ventricular ejection fraction was 60 to 65%. He had an episode about a month ago where he was picking up sticks in the yard and at one point when he stood up his vision suddenly went black and he leaned against a tree and got himself to the ground.  He did not lose consciousness and his vision came back after a short period of time.  He has had no further episodes like that.  This episode prompted further work-up and he underwent cardiac catheterization on 03/27/2021.  This showed a 75% proximal to mid LAD stenosis and a 90% mid RCA stenosis.  There is about 50% diagonal stenosis.  Right heart pressures were within normal limits. He sees his dentist regularly and has had no dental problems. Dr. Cyndia Bent discussed the need for coronary artery bypass grafting surgery and AVR (using a bioprosthetic valve, given his age). Potential risks, benefits, and complications of the surgery were discussed with the patient and he agreed to proceed with surgery. 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  Hospital  Course: Patient underwent a CABG x 3 and AVR (bioprosthetic) on 04/27/2021. He was transferred from the OR to [facility] ICU in stable condition. He was extubated later the evening of surgery. He was on back up external pacer. Gordy Councilman, a line, and foley were removed early in his post operative course. Chest tube and right leg drain were removed once output decreased, which was on 10/17. Patient was orthostatic. He did have at least 2 vagal episodes (he was anemic as well). He was started on Midodrine. He was found to have a slightly increased right pneumothorax on CXR on 10/16 (small left pneumothorax as well). Dr. Kipp Brood tired to place a  right pigtail catheter but this was not successful. Given the size of the pneumothorax, procedure was aborted. CT of the chest showed a small right pneumothorax and trace left pneumothorax, as well as small bilateral pleural effusions. Patient was not hypoxic so small pneumothoraces were monitored with daily chest x rays. He required a post op transfusion as he had worsening anemia. He also had thrombocytopenia. His platelet count went as low as 84,000. Midodrine was increased on 10/17 to 10 mg tid to help with continued ortho stasis and Lopressor was stopped. H and H improved to 8.6 and 25.5. Accu checks and SS PRN were stopped on 10/18. Thrombocytopenia continued to improve as platelets were up to 119,000 on 10/18. Patient required oxygen post op but was later weaned to room air. He has been tolerating a diet and has had a bowel movement. Epicardial pacing wires were removed on 10/18. Patient's ortho stasis continued to improve so Midodrine was decreased to 5 mg tid and Lopressor 12.5 mg bid was restarted for better HR control. Patient was felt surgically stable for transfer from the ICU to 4E for further convalescence on 10/18. He has been doing well on 4E and progressing appropriately. Today, he is tolerating room air, his incisions are healing well, he is ambulating with limited assistance, and he is ready for discharge home.     Latest Vital Signs: Blood pressure (!) 155/70, pulse 70, temperature 100.1 F (37.8 C), temperature source Oral, resp. rate 19, height 5\' 11"  (1.803 m), weight 78.1 kg, SpO2 98 %.  Physical Exam:  General appearance: alert and cooperative Heart: regular rate and rhythm, S1, S2 normal, no murmur, click, rub or gallop Lungs: clear to auscultation bilaterally Wound: chest incision healing well. Small amount of serous drainage from the right leg drain site. No edema  Discharge Condition:Stable and discharged to home.  Recent laboratory studies:  Lab Results  Component  Value Date   WBC 7.2 05/02/2021   HGB 7.7 (L) 05/02/2021   HCT 23.4 (L) 05/02/2021   MCV 94.4 05/02/2021   PLT 139 (L) 05/02/2021   Lab Results  Component Value Date   NA 133 (L) 05/02/2021   K 3.9 05/02/2021   CL 101 05/02/2021   CO2 26 05/02/2021   CREATININE 0.59 (L) 05/02/2021   GLUCOSE 105 (H) 05/02/2021      Diagnostic Studies: DG Chest 2 View  Result Date: 05/02/2021 CLINICAL DATA:  5 days post CABG per nurse EXAM: CHEST - 2 VIEW COMPARISON:  the previous day's study FINDINGS: The right IJ venous sheath has been removed. Persistent right pneumothorax, lung apex projecting at the level of the posterior aspect right fourth rib. Small bilateral pleural effusions. Heart size and mediastinal contours are within normal limits. Mechanical AVR. CABG markers. Sternotomy wires. Spinal fixation hardware partially visualized. IMPRESSION: Stable right  hydropneumothorax Electronically Signed   By: Lucrezia Europe M.D.   On: 05/02/2021 07:29   DG Chest 2 View  Result Date: 04/26/2021 CLINICAL DATA:  75 year old male with preoperative chest x-ray EXAM: CHEST - 2 VIEW COMPARISON:  06/04/2019 FINDINGS: Cardiomediastinal silhouette unchanged in size and contour. No evidence of central vascular congestion. No interlobular septal thickening. No pneumothorax or pleural effusion. Coarsened interstitial markings, with no confluent airspace disease. No acute displaced fracture. Degenerative changes of the spine. Surgical changes of the cervical region. IMPRESSION: Negative for acute cardiopulmonary disease Electronically Signed   By: Corrie Mckusick D.O.   On: 04/26/2021 08:58   CT CHEST WO CONTRAST  Result Date: 04/29/2021 CLINICAL DATA:  Pneumothorax EXAM: CT CHEST WITHOUT CONTRAST TECHNIQUE: Multidetector CT imaging of the chest was performed following the standard protocol without IV contrast. COMPARISON:  Chest radiograph, 04/29/2021, 5:16 a.m. FINDINGS: Cardiovascular: Aortic atherosclerosis. Aortic  valve prosthesis and graft repair of the ascending thoracic aorta. Normal heart size. Extensive 3 vessel coronary artery calcifications and/or stents. No pericardial effusion. Mediastinal drainage tubes. Mediastinum/Nodes: No enlarged mediastinal, hilar, or axillary lymph nodes. Thyroid gland, trachea, and esophagus demonstrate no significant findings. Lungs/Pleura: Small bilateral pleural effusions with associated atelectasis or consolidation. Small, approximately 20% volume right pneumothorax, as seen on prior radiographs. Tiny, less than 5% left apical pneumothorax with left-sided chest tube in position. Upper Abdomen: No acute abnormality. Musculoskeletal: No chest wall mass or suspicious bone lesions identified. IMPRESSION: 1. Small, approximately 20% volume right pneumothorax, as seen on prior radiographs. 2. Tiny, less than 5% left apical pneumothorax with left-sided chest tube in position. 3. Small bilateral pleural effusions with associated atelectasis or consolidation. 4. 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 Aortic Atherosclerosis (ICD10-I70.0). Electronically Signed   By: Delanna Ahmadi M.D.   On: 04/29/2021 12:20   DG CHEST PORT 1 VIEW  Result Date: 05/01/2021 CLINICAL DATA:  Chest tube removal.  Follow-up CABG. EXAM: PORTABLE CHEST 1 VIEW COMPARISON:  04/30/2021 FINDINGS: Left chest tube has been removed. No visible pneumothorax on the left. Persistent pneumothorax on the right which is not visibly enlarging. This is estimated at 25%. Right venous access sheath remains in place. IMPRESSION: Left chest tube removed.  No pneumothorax seen on the left. Persistent pneumothorax on the right, approximately 25%, not visibly changed. Electronically Signed   By: Nelson Chimes M.D.   On: 05/01/2021 08:28   DG CHEST PORT 1 VIEW  Result Date: 04/30/2021 CLINICAL DATA:  Chest tube present s/p open heart surg ,PTX,sore chest EXAM: PORTABLE CHEST - 1 VIEW COMPARISON:  the  previous day's study FINDINGS: Stable right apical moderate pneumothorax, the apex projecting just above the posterior aspect right fourth rib as before. The small left apical pneumothorax seen previously has resolved, left chest tube still in place. Right IJ venous sheath stable in position. Relatively low lung volumes with some crowding of bronchovascular structures at the lung bases. Heart size and mediastinal contours are within normal limits. Mechanical AVR. CABG markers. No effusion. Sternotomy wires.  Cervical fixation hardware partially seen. IMPRESSION: 1. Resolution of left apical pneumothorax, with stable chest tube. 2. Stable moderate right apical pneumothorax. Electronically Signed   By: Lucrezia Europe M.D.   On: 04/30/2021 08:23   DG Chest Port 1 View  Result Date: 04/29/2021 CLINICAL DATA:  Pneumothorax EXAM: PORTABLE CHEST 1 VIEW COMPARISON:  Chest x-rays dated 04/28/2021 and 04/27/2021. FINDINGS: Swan-Ganz catheter has been removed. RIGHT IJ Cordis remains with tip  positioned at the level of the upper SVC. LEFT-sided chest tubes are stable in position. Compared to chest x-ray of 04/27/2021, slightly increased pneumothorax on the RIGHT, now moderate in size with approximately 2.4 cm pleural displacement at the RIGHT lung apex. Stable smaller pneumothorax at the LEFT lung apex. Mild bibasilar atelectasis. IMPRESSION: 1. Slightly increased pneumothorax on the RIGHT, now moderate in size with approximately 2.4 cm pleural displacement at the RIGHT lung apex. 2. Stable small pneumothorax at the LEFT lung apex. LEFT-sided chest tubes are stable in position. These results will be called to the ordering clinician or representative by the Radiologist Assistant, and communication documented in the PACS or [employer]. Electronically Signed   By: Franki Cabot M.D.   On: 04/29/2021 08:06   DG Chest Port 1 View  Result Date: 04/28/2021 CLINICAL DATA:  Shortness of breath EXAM: PORTABLE CHEST 1 VIEW  COMPARISON:  1 day prior FINDINGS: Extubation. Removal of nasogastric tube. Right IJ Swan-Ganz catheter tip at outflow tract. Mediastinal drain and left-sided chest tubes. Midline trachea. Mild cardiomegaly. Prior median sternotomy. No pleural fluid. 10% left apical pneumothorax is increased, with visceral pleural line 1.6 cm from chest wall. Improvement in right apical pneumothorax, between 5 and 10%. Visceral pleural line 1.0 cm from chest wall. Mild left base subsegmental atelectasis. Improved right base aeration. IMPRESSION: Increased left and decreased right small apical pneumothoraces, with left-sided chest tubes in place. Extubation and removal of nasogastric tube. Electronically Signed   By: Abigail Miyamoto M.D.   On: 04/28/2021 08:36   DG Chest Port 1 View  Result Date: 04/27/2021 CLINICAL DATA:  Pneumothorax EXAM: PORTABLE CHEST 1 VIEW COMPARISON:  Portable exam 1445 hours compared to 04/25/2021 FINDINGS: Tip of endotracheal tube projects 5.8 cm above carina. Nasogastric tube extends into stomach. RIGHT jugular Swan-Ganz catheter with tip projecting over RIGHT pulmonary artery near hilum. Mediastinal drains and LEFT thoracostomy tube present. Normal heart size with postsurgical changes of CABG and AVR. Epicardial pacing wires noted. Small LEFT apex pneumothorax. Additional small RIGHT apex pneumothorax identified as well. Minimal bibasilar atelectasis. IMPRESSION: Interval postsurgical changes with bibasilar atelectasis. BILATERAL small pneumothoraces larger on RIGHT; patient has a LEFT thoracostomy tube but not a RIGHT thoracostomy tube. Critical Value/emergent results were called by telephone at the time of interpretation on 04/27/2021 at 3:15 pm to provider Gold PA, who verbally acknowledged these results. Electronically Signed   By: Lavonia Dana M.D.   On: 04/27/2021 15:15   ECHO INTRAOPERATIVE TEE  Result Date: 04/27/2021  *INTRAOPERATIVE TRANSESOPHAGEAL REPORT *  Patient Name:   Nicholas Wise   Date of Exam: 04/27/2021 Medical Rec #:  [id number]      Height:       69.0 in Accession #:    [id number]     Weight:       176.0 lb Date of Birth:  1945-07-19     BSA:          1.96 m Patient Age:    16 years       BP:           125/65 mmHg Patient Gender: M              HR:           84 bpm. Exam Location:  Anesthesiology Transesophogeal exam was perform intraoperatively during surgical procedure. Patient was closely monitored under general anesthesia during the entirety of examination. Indications:     CAD Native Vessel i25.10  Aortic Stenosis i35.0 Sonographer:     Raquel Sarna Senior RDCS Performing Phys: Adele Barthel MD Diagnosing Phys: Adele Barthel MD Complications: No known complications during this procedure. POST-OP IMPRESSIONS _ Left Ventricle: The left ventricle is unchanged from pre-bypass. _ Right Ventricle: The right ventricle appears unchanged from pre-bypass. _ Left Atrial Appendage: The left atrial appendage appears unchanged from pre-bypass. _ Aortic Valve: No regurgitation post repair. The gradient recorded across the prosthetic valve is within the expected range. No perivalvular leak noted. _ Mitral Valve: There is no regurgitation. _ Tricuspid Valve: The tricuspid valve appears unchanged from pre-bypass. _ Pulmonic Valve: The pulmonic valve appears unchanged from pre-bypass. _ Interatrial Septum: The interatrial septum appears unchanged from pre-bypass. _ Pericardium: The pericardium appears unchanged from pre-bypass. PRE-OP FINDINGS  Left Ventricle: The left ventricle has normal systolic function, with an ejection fraction of 60-65%. The cavity size was normal. There is mildly increased left ventricular wall thickness. There is mild concentric left ventricular hypertrophy. Right Ventricle: The right ventricle has normal systolic function. The cavity was normal. There is no increase in right ventricular wall thickness. Left Atrium: Left atrial size was normal in size. No left  atrial/left atrial appendage thrombus was detected. Right Atrium: Right atrial size was normal in size. Interatrial Septum: No atrial level shunt detected by color flow Doppler. Pericardium: Trivial pericardial effusion is present. Mitral Valve: The mitral valve is normal in structure. Mitral valve regurgitation is trivial by color flow Doppler. Tricuspid Valve: The tricuspid valve was normal in structure. Tricuspid valve regurgitation was not visualized by color flow Doppler. Aortic Valve: Probably tricspid Aortic valve regurgitation is trivial by color flow Doppler. Pulmonic Valve: The pulmonic valve was normal in structure. Pulmonic valve regurgitation is not visualized by color flow Doppler. Aorta: The aortic root and ascending aorta are normal in size and structure. There is mild dilatation of the aortic arch, measuring 38 mm. +--------------+--------++ LEFT VENTRICLE         +--------------+--------++ PLAX 2D                +--------------+--------++ LVIDd:        4.30 cm  +--------------+--------++ LVIDs:        2.70 cm  +--------------+--------++ LVOT diam:    2.20 cm  +--------------+--------++ LV SV:        56 ml    +--------------+--------++ LV SV Index:  28.26    +--------------+--------++ LVOT Area:    3.80 cm +--------------+--------++                        +--------------+--------++ +------------------+------------++ AORTIC VALVE                   +------------------+------------++ AV Area (Vmax):   0.67 cm     +------------------+------------++ AV Area (Vmean):  0.66 cm     +------------------+------------++ AV Area (VTI):    0.66 cm     +------------------+------------++ AV Vmax:          405.50 cm/s  +------------------+------------++ AV Vmean:         327.000 cm/s +------------------+------------++ AV VTI:           1.150 m      +------------------+------------++ AV Peak Grad:     65.8 mmHg     +------------------+------------++ AV Mean Grad:     46.5 mmHg    +------------------+------------++ LVOT Vmax:        71.50 cm/s   +------------------+------------++ LVOT Vmean:  56.700 cm/s  +------------------+------------++ LVOT VTI:         0.200 m      +------------------+------------++ LVOT/AV VTI ratio:0.17         +------------------+------------++  +--------------+-------+ SHUNTS                +--------------+-------+ Systemic VTI: 0.20 m  +--------------+-------+ Systemic Diam:2.20 cm +--------------+-------+  Adele Barthel MD Electronically signed by Adele Barthel MD Signature Date/Time: 04/27/2021/3:57:17 PM    Final    VAS US DOPPLER PRE CABG  Result Date: 04/25/2021 PREOPERATIVE VASCULAR EVALUATION Patient Name:  Nicholas Wise  Date of Exam:   04/25/2021 Medical Rec #: [id number]      Accession #:    [id number] Date of Birth: April 09, 1946     Patient Gender: M Patient Age:   39 years Exam [facility]  [facility] Procedure:      VAS US DOPPLER PRE CABG Referring Phys: Gilford Raid --------------------------------------------------------------------------------  Indications:      Pre-CABG. Risk Factors:     Hyperlipidemia. Comparison Study: No prior study Performing Technologist: Maudry Mayhew MHA, RVT, RDCS, RDMS  Examination Guidelines: A complete evaluation includes B-mode imaging, spectral Doppler, color Doppler, and power Doppler as needed of all accessible portions of each vessel. Bilateral testing is considered an integral part of a complete examination. Limited examinations for reoccurring indications may be performed as noted.  Right Carotid Findings: +----------+--------+--------+--------+---------------------+------------------+           PSV cm/sEDV cm/sStenosisDescribe             Comments           +----------+--------+--------+--------+---------------------+------------------+ CCA Prox  70      9                                                        +----------+--------+--------+--------+---------------------+------------------+ CCA Distal67      12              heterogenous and                                                          irregular                               +----------+--------+--------+--------+---------------------+------------------+ ICA Prox                                               Small in diameter;                                                        no discernable  waveform           +----------+--------+--------+--------+---------------------+------------------+ ECA       124     14              irregular and                                                             calcific                                +----------+--------+--------+--------+---------------------+------------------+ +----------+--------+-------+----------------+------------+           PSV cm/sEDV cmsDescribe        Arm Pressure +----------+--------+-------+----------------+------------+ Subclavian78             Multiphasic, WNL             +----------+--------+-------+----------------+------------+ +---------+--------+--+--------+--+---------+ VertebralPSV cm/s40EDV cm/s12Antegrade +---------+--------+--+--------+--+---------+ Left Carotid Findings: +----------+--------+--------+--------+-----------------------+--------+           PSV cm/sEDV cm/sStenosisDescribe               Comments +----------+--------+--------+--------+-----------------------+--------+ CCA Prox  93      24                                              +----------+--------+--------+--------+-----------------------+--------+ CCA Distal62      17                                              +----------+--------+--------+--------+-----------------------+--------+ ICA Prox  265     89      60-79%  smooth and  heterogenous         +----------+--------+--------+--------+-----------------------+--------+ ICA Distal103     34                                              +----------+--------+--------+--------+-----------------------+--------+ ECA       79      10              smooth and heterogenous         +----------+--------+--------+--------+-----------------------+--------+  +----------+--------+--------+----------------+------------+ SubclavianPSV cm/sEDV cm/sDescribe        Arm Pressure +----------+--------+--------+----------------+------------+           90              Multiphasic, WNL             +----------+--------+--------+----------------+------------+ +---------+--------+--+--------+--+---------+ VertebralPSV cm/s45EDV cm/s15Antegrade +---------+--------+--+--------+--+---------+  ABI Findings: +--------+------------------+-----+---------+--------+ Right   Rt Pressure (mmHg)IndexWaveform Comment  +--------+------------------+-----+---------+--------+ [id number]                    triphasic         +--------+------------------+-----+---------+--------+ PTA                            triphasic         +--------+------------------+-----+---------+--------+ DP  triphasic         +--------+------------------+-----+---------+--------+ +--------+------------------+-----+---------+-------+ Left    Lt Pressure (mmHg)IndexWaveform Comment +--------+------------------+-----+---------+-------+ [id number]                    triphasic        +--------+------------------+-----+---------+-------+ PTA                            triphasic        +--------+------------------+-----+---------+-------+ DP                             triphasic        +--------+------------------+-----+---------+-------+  Right Doppler Findings: +-----------+--------+-----+---------+--------------------+ Site       PressureIndexDoppler   Comments             +-----------+--------+-----+---------+--------------------+ Brachial   151          triphasic                     +-----------+--------+-----+---------+--------------------+ Radial                  triphasic                     +-----------+--------+-----+---------+--------------------+ Ulnar                   triphasic                     +-----------+--------+-----+---------+--------------------+ Palmar Arch                      Within normal limits +-----------+--------+-----+---------+--------------------+  Left Doppler Findings: +-----------+--------+-----+---------+-----------------------------------------+ Site       PressureIndexDoppler  Comments                                  +-----------+--------+-----+---------+-----------------------------------------+ Brachial   142          triphasic                                          +-----------+--------+-----+---------+-----------------------------------------+ Radial                  triphasic                                          +-----------+--------+-----+---------+-----------------------------------------+ Ulnar                   triphasic                                          +-----------+--------+-----+---------+-----------------------------------------+ Palmar Arch                      Signal obliterates with radial                                             compression, reverses with ulnar  compression.                              +-----------+--------+-----+---------+-----------------------------------------+  Summary: Right Carotid: Evidence consistent with a total occlusion of the right ICA. Left Carotid: Velocities in the left ICA are consistent with a 60-79% stenosis. Vertebrals:  Bilateral vertebral arteries demonstrate antegrade flow. Subclavians: Normal flow hemodynamics were seen in bilateral subclavian               arteries.  Electronically signed by Harold Barban MD on 04/25/2021 at 9:21:33 PM.    Final      Discharge Medications: Allergies as of 05/03/2021   No Known Allergies      Medication List     STOP taking these medications    APPLE CIDER VINEGAR PO   clopidogrel 75 MG tablet Commonly known as: PLAVIX   Selenium 100 MCG Tabs   traMADol 50 MG tablet Commonly known as: Ultram   zinc gluconate 50 MG tablet       TAKE these medications    acetaminophen 500 MG tablet Commonly known as: TYLENOL Take 1,000 mg by mouth in the morning and at bedtime.   allopurinol 300 MG tablet Commonly known as: ZYLOPRIM Take 300 mg by mouth in the morning.   ALPRAZolam 0.5 MG tablet Commonly known as: XANAX Take 0.25 mg by mouth 2 (two) times daily as needed for anxiety.   aspirin 325 MG EC tablet Take 1 tablet (325 mg total) by mouth daily. What changed:  medication strength how much to take additional instructions   atorvastatin 40 MG tablet Commonly known as: LIPITOR Take 40 mg by mouth every evening.   cholecalciferol 25 MCG (1000 UNIT) tablet Commonly known as: VITAMIN D3 Take 1,000 Units by mouth daily.   finasteride 5 MG tablet Commonly known as: PROSCAR Take 5 mg by mouth daily.   fluorouracil 5 % cream Commonly known as: EFUDEX Apply 1 application topically daily as needed (sun damage).   metoprolol tartrate 25 MG tablet Commonly known as: LOPRESSOR Take 0.5 tablets (12.5 mg total) by mouth 2 (two) times daily.   oxyCODONE 5 MG immediate release tablet Commonly known as: Oxy IR/ROXICODONE Take 1-2 tablets (5-10 mg total) by mouth every 3 (three) hours as needed for severe pain.   potassium citrate 10 MEQ (1080 MG) SR tablet Commonly known as: UROCIT-K Take 10 mEq by mouth 2 (two) times daily.   sertraline 100 MG tablet Commonly known as: ZOLOFT Take 100 mg by mouth at bedtime.   Simply Saline 0.9 % Aers Generic drug: Saline Place 1 spray  into the nose in the morning and at bedtime.   SYSTANE OP Place 1 drop into both eyes in the morning and at bedtime.   tamsulosin 0.4 MG Caps capsule Commonly known as: FLOMAX Take 0.4 mg by mouth 2 (two) times daily.   triamcinolone 55 MCG/ACT Aero nasal inhaler Commonly known as: NASACORT Place 1 spray into the nose in the morning.       The patient has been discharged on:   1.Beta Blocker:  Yes [ yes  ]                              No   [   ]  If No, reason:  2.Ace Inhibitor/ARB: Yes [   ]                                     No  [   no ]                                     If No, reason: required midodrine in the hospital   3.Statin:   Yes [  yes ]                  No  [   ]                  If No, reason:  4.Ecasa:  Yes  [ yes  ]                  No   [   ]                  If No, reason:  Patient had ACS upon admission:  Plavix/P2Y12 inhibitor: Yes [   ]                                      No  [  no ]  Follow Up Appointments:  Follow-up Information     Josem Kaufmann, MD. Call.   Specialty: Family Medicine Why: for a follow up for one month regarding further surveillance of HGA1C 5.7 (pre diabetes) Contact information: [address] [place] [address] [phone]         [facility] [place]. Go on 05/14/2021.   Specialty: Cardiothoracic Surgery Why: Appointment is with nurse only for chest tube suture removal. Appointment time is at 11:00 am Contact information: [address] [place], [place], Scott T, PA-C Follow up.   Specialties: Cardiology, Physician Assistant Why: Hospital follow-up scheduled for 05/21/2021 at 11:15am with Richardson Dopp, one of Dr. Hassell Done PAs. Please arrive 15 minutes early for check-in. If this date/time does not work for you, please call our office to reschedule. Contact information: [address]  [place] [address] [phone]         [facility] [facility] Office Follow up.   Specialty: Cardiology Why: Repeat Echocardiogram scheduled for 06/11/2021 at 1:05pm at Dr. Hassell Done office. Please arrive 20 minutes early for check-in. If this date/time does not work for you, please call our office to reschedule. Contact information: [address], [place] [phone]        [facility]. Go on 05/30/2021.   Specialty: Cardiothoracic Surgery Why: PA/LAT CXR to be taken (at [facility] which is in the same building as Dr. Vivi Martens office on 11/16 at 1:00 pm;Appointment time is at 1:30 pm Contact information: [address] [place], [place] [phone]                Signed: [place] 05/03/2021, 8:11 AM

## 2021-04-27 NOTE — Procedures (Signed)
Extubation Procedure Note  Patient Details:   Name: Nicholas Wise DOB: 07-28-1945 MRN: [id number]   Airway Documentation:    Vent end date: 04/27/21 Vent end time: 2110   Evaluation  O2 sats: stable throughout Complications: No apparent complications Patient did tolerate procedure well. Bilateral Breath Sounds: Clear, Diminished   Yes NIF -23 FVC 1.2L Positive Cuff Leak 2L [name] 04/27/2021, 9:18 PM

## 2021-04-27 NOTE — Anesthesia Procedure Notes (Signed)
Arterial Line Insertion Start/End10/14/2022 6:50 AM, 04/27/2021 7:00 AM Performed by: Lavell Luster, CRNA, CRNA  Patient location: Pre-op. Preanesthetic checklist: patient identified, IV checked, site marked, risks and benefits discussed, surgical consent, monitors and equipment checked, pre-op evaluation and timeout performed Lidocaine 1% used for infiltration and patient sedated Left, radial was placed Catheter size: 20 G Hand hygiene performed , maximum sterile barriers used  and Seldinger technique used Allen's test indicative of satisfactory collateral circulation Attempts: 2 Procedure performed without using ultrasound guided technique. Ultrasound Notes:anatomy identified Following insertion, Biopatch and dressing applied. Patient tolerated the procedure well with no immediate complications.

## 2021-04-27 NOTE — Interval H&P Note (Signed)
History and Physical Interval Note:  04/27/2021 7:16 AM  Nicholas Wise  has presented today for surgery, with the diagnosis of AS CAD.  The various methods of treatment have been discussed with the patient and family. After consideration of risks, benefits and other options for treatment, the patient has consented to  Procedure(s): AORTIC VALVE REPLACEMENT (AVR) (N/A) CORONARY ARTERY BYPASS GRAFTING (CABG) (N/A) TRANSESOPHAGEAL ECHOCARDIOGRAM (TEE) (N/A) as a surgical intervention.  The patient's history has been reviewed, patient examined, no change in status, stable for surgery.  I have reviewed the patient's chart and labs.  Questions were answered to the patient's satisfaction.     Gaye Pollack

## 2021-04-27 NOTE — Anesthesia Procedure Notes (Signed)
Central Venous Catheter Insertion Performed by: Murvin Natal, MD, anesthesiologist Start/End10/14/2022 6:45 AM, 04/27/2021 7:00 AM Patient location: Pre-op. Preanesthetic checklist: patient identified, IV checked, site marked, risks and benefits discussed, surgical consent, monitors and equipment checked, pre-op evaluation, timeout performed and anesthesia consent Position: Trendelenburg Lidocaine 1% used for infiltration and patient sedated Hand hygiene performed , maximum sterile barriers used  and Seldinger technique used Catheter size: 9 Fr Total catheter length 12. PA cath was placed.MAC introducer Swan type:thermodilution PA Cath depth:49 Procedure performed using ultrasound guided technique. Ultrasound Notes:anatomy identified, needle tip was noted to be adjacent to the nerve/plexus identified and no ultrasound evidence of intravascular and/or intraneural injection Attempts: 1 Following insertion, line sutured, dressing applied and Biopatch. Post procedure assessment: blood return through all ports, free fluid flow and no air  Patient tolerated the procedure well with no immediate complications.

## 2021-04-27 NOTE — Hospital Course (Addendum)
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  He reports having some shortness of breath with going up stairs or walking up hills for the past several months but remains fairly active.  He really did not think very much about the symptoms.  He underwent repeat echocardiogram on 01/08/2021 showing a calcified and thickened aortic valve with restricted leaflet mobility.  The mean gradient had increased to 40 mmHg compared to his prior echo in 2019 with trivial regurgitation.  Aortic valve area was 0.66 cm.  Left ventricular ejection fraction was 60 to 65%. He had an episode about a month ago where he was picking up sticks in the yard and at one point when he stood up his vision suddenly went black and he leaned against a tree and got himself to the ground.  He did not lose consciousness and his vision came back after a short period of time.  He has had no further episodes like that.  This episode prompted further work-up and he underwent cardiac catheterization on 03/27/2021.  This showed a 75% proximal to mid LAD stenosis and a 90% mid RCA stenosis.  There is about 50% diagonal stenosis.  Right heart pressures were within normal limits. He sees his dentist regularly and has had no dental problems. Dr. Cyndia Bent discussed the need for coronary artery bypass grafting surgery and AVR (using a bioprosthetic valve, given his age). Potential risks, benefits, and complications of the surgery were discussed with the patient and he agreed to proceed with surgery. Pre operative carotid duplex US showed total occlusion of the right ICA and no significant left internal carotid artery  stenosis.  Hospital Course: Patient underwent a CABG x 3 and AVR (bioprosthetic) on 04/27/2021. He was transferred from the OR to [facility] ICU in stable condition. He was extubated later the evening of surgery. He was on back up external pacer. Gordy Councilman, a line, and foley were removed early in his post operative course. Chest tube and right leg drain were removed once output decreased, which was on 10/17. Patient was orthostatic. He did have at least 2 vagal episodes (he was anemic as well). He was started on Midodrine. He was found to have a slightly increased right pneumothorax on CXR on 10/16 (small left pneumothorax as well). Dr. Kipp Brood tired to place a right pigtail catheter but this was not successful. Given the size of the pneumothorax, procedure was aborted. CT of the chest showed a small right pneumothorax and trace left pneumothorax, as well as small bilateral pleural effusions. Patient was not hypoxic so small pneumothoraces were monitored with daily chest x rays. He required a post op transfusion as he had worsening anemia. He also had thrombocytopenia. His platelet count went as low as 84,000. Midodrine was increased on 10/17 to 10 mg tid to help with continued ortho stasis and Lopressor was stopped. H and H improved to 8.6 and 25.5. Accu checks and SS PRN were stopped on 10/18. Thrombocytopenia continued to improve as platelets were up to 119,000 on 10/18. Patient required oxygen post op but was later weaned to room air. He has been tolerating a diet and has had a bowel movement. Epicardial pacing wires were removed on 10/18. Patient's ortho stasis  continued to improve so Midodrine was decreased to 5 mg tid and Lopressor 12.5 mg bid was restarted for better HR control. Patient was felt surgically stable for transfer from the ICU to 4E for further convalescence on 10/18.

## 2021-04-28 ENCOUNTER — Inpatient Hospital Stay (HOSPITAL_COMMUNITY): Payer: Medicare Other

## 2021-04-28 LAB — BPAM PLATELET PHERESIS
Blood Product Expiration Date: 202210162359
ISSUE DATE / TIME: 202210141207
Unit Type and Rh: 5100

## 2021-04-28 LAB — CBC
HCT: 23.8 % — ABNORMAL LOW (ref 39.0–52.0)
HCT: 24.1 % — ABNORMAL LOW (ref 39.0–52.0)
Hemoglobin: 7.9 g/dL — ABNORMAL LOW (ref 13.0–17.0)
Hemoglobin: 8 g/dL — ABNORMAL LOW (ref 13.0–17.0)
MCH: 32.1 pg (ref 26.0–34.0)
MCH: 32.5 pg (ref 26.0–34.0)
MCHC: 33.2 g/dL (ref 30.0–36.0)
MCHC: 33.2 g/dL (ref 30.0–36.0)
MCV: 96.7 fL (ref 80.0–100.0)
MCV: 98 fL (ref 80.0–100.0)
Platelets: 96 10*3/uL — ABNORMAL LOW (ref 150–400)
Platelets: 108 10*3/uL — ABNORMAL LOW (ref 150–400)
RBC: 2.46 MIL/uL — ABNORMAL LOW (ref 4.22–5.81)
RBC: 2.46 MIL/uL — ABNORMAL LOW (ref 4.22–5.81)
RDW: 14.6 % (ref 11.5–15.5)
RDW: 15 % (ref 11.5–15.5)
WBC: 8.7 10*3/uL (ref 4.0–10.5)
WBC: 10 10*3/uL (ref 4.0–10.5)
nRBC: 0 % (ref 0.0–0.2)
nRBC: 0 % (ref 0.0–0.2)

## 2021-04-28 LAB — BASIC METABOLIC PANEL
Anion gap: 4 — ABNORMAL LOW (ref 5–15)
Anion gap: 8 (ref 5–15)
BUN: 12 mg/dL (ref 8–23)
BUN: 13 mg/dL (ref 8–23)
CO2: 25 mmol/L (ref 22–32)
CO2: 25 mmol/L (ref 22–32)
Calcium: 8 mg/dL — ABNORMAL LOW (ref 8.9–10.3)
Calcium: 8.1 mg/dL — ABNORMAL LOW (ref 8.9–10.3)
Chloride: 106 mmol/L (ref 98–111)
Chloride: 100 mmol/L (ref 98–111)
Creatinine, Ser: 0.68 mg/dL (ref 0.61–1.24)
Creatinine, Ser: 0.78 mg/dL (ref 0.61–1.24)
GFR, Estimated: >60 mL/min (ref >60)
GFR, Estimated: >60 mL/min (ref >60)
Glucose, Bld: 123 mg/dL — ABNORMAL HIGH (ref 70–99)
Glucose, Bld: 153 mg/dL — ABNORMAL HIGH (ref 70–99)
Potassium: 4.8 mmol/L (ref 3.5–5.1)
Potassium: 4.5 mmol/L (ref 3.5–5.1)
Sodium: 135 mmol/L (ref 135–145)
Sodium: 133 mmol/L — ABNORMAL LOW (ref 135–145)

## 2021-04-28 LAB — GLUCOSE, CAPILLARY
Glucose-Capillary: 141 mg/dL — ABNORMAL HIGH (ref 70–99)
Glucose-Capillary: 144 mg/dL — ABNORMAL HIGH (ref 70–99)
Glucose-Capillary: 105 mg/dL — ABNORMAL HIGH (ref 70–99)
Glucose-Capillary: 129 mg/dL — ABNORMAL HIGH (ref 70–99)
Glucose-Capillary: 123 mg/dL — ABNORMAL HIGH (ref 70–99)
Glucose-Capillary: 123 mg/dL — ABNORMAL HIGH (ref 70–99)
Glucose-Capillary: 104 mg/dL — ABNORMAL HIGH (ref 70–99)
Glucose-Capillary: 117 mg/dL — ABNORMAL HIGH (ref 70–99)
Glucose-Capillary: 120 mg/dL — ABNORMAL HIGH (ref 70–99)
Glucose-Capillary: 132 mg/dL — ABNORMAL HIGH (ref 70–99)

## 2021-04-28 LAB — PREPARE PLATELET PHERESIS: Unit division: 0

## 2021-04-28 LAB — MAGNESIUM
Magnesium: 2.1 mg/dL (ref 1.7–2.4)
Magnesium: 2 mg/dL (ref 1.7–2.4)

## 2021-04-28 MED ORDER — INSULIN ASPART 100 UNIT/ML IJ SOLN
0.0000 [IU] | INTRAMUSCULAR | Status: DC
  Administered 2021-04-28 – 2021-04-30 (×4): 2 [IU] via SUBCUTANEOUS

## 2021-04-28 MED ORDER — INSULIN ASPART 100 UNIT/ML IJ SOLN: 0.0000 [IU] | INTRAMUSCULAR | Status: DC

## 2021-04-28 MED ORDER — PNEUMOCOCCAL VAC POLYVALENT 25 MCG/0.5ML IJ INJ
0.5000 mL | INJECTION | INTRAMUSCULAR | Status: DC | PRN
Start: 2021-04-28 — End: 2021-05-03
  Filled 2021-04-28: qty 0.5

## 2021-04-28 MED ORDER — MIDODRINE HCL 5 MG PO TABS
5.0000 mg | ORAL_TABLET | Freq: Three times a day (TID) | ORAL | Status: DC
  Administered 2021-04-28 – 2021-04-29 (×5): 5 mg via ORAL
  Filled 2021-04-28 (×5): qty 1

## 2021-04-28 NOTE — Progress Notes (Signed)
EKG CRITICAL VALUE     12 lead EKG performed.  Critical value noted.Lorel Monaco  , RN notified.   Shaketa Serafin  Ananthanarayana Viswanatha, CCT 04/28/2021 8:57 AM

## 2021-04-28 NOTE — Progress Notes (Signed)
[address]       [place] [address]             [phone]                 1 Day Post-Op Procedure(s) (LRB): AORTIC VALVE REPLACEMENT (AVR) USING INSPIRIS 25MM AORTIC VALVE (N/A) CORONARY ARTERY BYPASS GRAFTING (CABG) X  3 , USING LEFT INTERNAL MAMMARY ARTERY AND RIGHT ENDOSCOPIC GREATER SAPHENOUS VEIN CONDUITS (N/A) TRANSESOPHAGEAL ECHOCARDIOGRAM (TEE) (N/A) ENDOVEIN HARVEST OF GREATER SAPHENOUS VEIN (Right) APPLICATION OF CELL SAVER   Events: Vagal episode this am with standing _______________________________________________________________ Vitals: BP 114/67   Pulse 97   Temp 98.8 F (37.1 C)   Resp 17   Ht 5\' 9"  (1.753 m)   Wt 82 kg   SpO2 98%   BMI 26.70 kg/m  Filed Weights   04/27/21 0616 04/28/21 0500  Weight: 79.8 kg 82 kg     - Neuro: alert NAD  - Cardiovascular: sinus  Drips: none.   PAP: (7-30)/(-7-20) 20/13 CO:  [2.3 L/min-3.5 L/min] 3.2 L/min CI:  [1.2 L/min/m2-1.8 L/min/m2] 1.7 L/min/m2  - Pulm: EWOB   ABG    Component Value Date/Time   PHART 7.322 (L) 04/27/2021 2210   PCO2ART 43.4 04/27/2021 2210   PO2ART 91 04/27/2021 2210   HCO3 22.4 04/27/2021 2210   TCO2 24 04/27/2021 2210   ACIDBASEDEF 3.0 (H) 04/27/2021 2210   O2SAT 96.0 04/27/2021 2210    - Abd: ND - Extremity: trace edema  .Intake/Output      10/14 0701 10/15 0700 10/15 0701 10/16 0700   I.V. (mL/kg) 2693.1 (32.8)    Blood 1135    IV Piggyback 2484.5    Total Intake(mL/kg) 6312.6 (77)    Urine (mL/kg/hr) 2920 (1.5)    Drains 50    Blood 900    Chest Tube 600    Total Output 4470    Net +1842.6            _______________________________________________________________ Labs: CBC Latest Ref Rng & Units 04/28/2021 04/27/2021 04/27/2021  WBC 4.0 - 10.5 K/uL 8.7 - -  Hemoglobin 13.0 - 17.0 g/dL 7.9(L) 8.2(L) 8.2(L)  Hematocrit 39.0 - 52.0 % 23.8(L) 24.0(L) 24.0(L)  Platelets 150 - 400 K/uL 96(L) - -   CMP Latest Ref Rng & Units  04/28/2021 04/27/2021 04/27/2021  Glucose 70 - 99 mg/dL 123(H) - -  BUN 8 - 23 mg/dL 12 - -  Creatinine 0.61 - 1.24 mg/dL 0.68 - -  Sodium 135 - 145 mmol/L 135 140 138  Potassium 3.5 - 5.1 mmol/L 4.8 4.3 4.2  Chloride 98 - 111 mmol/L 106 - -  CO2 22 - 32 mmol/L 25 - -  Calcium 8.9 - 10.3 mg/dL 8.0(L) - -  Total Protein 6.5 - 8.1 g/dL - - -  Total Bilirubin 0.3 - 1.2 mg/dL - - -  Alkaline Phos 38 - 126 U/L - - -  AST 15 - 41 U/L - - -  ALT 0 - 44 U/L - - -    CXR: Small B pneumoT  _______________________________________________________________  Assessment and Plan: POD 1 s/p AVR cabg  Neuro: pain controlled CV: adding midodrine for vagal episode.  Will encourage PO intake as well.  On A/S, holding BB.  Pacing set to back-up Pulm: will keep CT.  Continue pulm hygiene  Renal: creat stable.  Good uop GI: advancing diet Heme: H/H stable ID: [email or web address] Endo: SSI Dispo: continue ICU care  Lajuana Matte 04/28/2021 8:43 AM

## 2021-04-29 ENCOUNTER — Inpatient Hospital Stay (HOSPITAL_COMMUNITY): Payer: Medicare Other

## 2021-04-29 LAB — CBC
HCT: 22.4 % — ABNORMAL LOW (ref 39.0–52.0)
Hemoglobin: 7.3 g/dL — ABNORMAL LOW (ref 13.0–17.0)
MCH: 31.9 pg (ref 26.0–34.0)
MCHC: 32.6 g/dL (ref 30.0–36.0)
MCV: 97.8 fL (ref 80.0–100.0)
Platelets: 84 10*3/uL — ABNORMAL LOW (ref 150–400)
RBC: 2.29 MIL/uL — ABNORMAL LOW (ref 4.22–5.81)
RDW: 15 % (ref 11.5–15.5)
WBC: 7.1 10*3/uL (ref 4.0–10.5)
nRBC: 0 % (ref 0.0–0.2)

## 2021-04-29 LAB — GLUCOSE, CAPILLARY
Glucose-Capillary: 105 mg/dL — ABNORMAL HIGH (ref 70–99)
Glucose-Capillary: 107 mg/dL — ABNORMAL HIGH (ref 70–99)
Glucose-Capillary: 111 mg/dL — ABNORMAL HIGH (ref 70–99)
Glucose-Capillary: 112 mg/dL — ABNORMAL HIGH (ref 70–99)
Glucose-Capillary: 116 mg/dL — ABNORMAL HIGH (ref 70–99)
Glucose-Capillary: 119 mg/dL — ABNORMAL HIGH (ref 70–99)
Glucose-Capillary: 123 mg/dL — ABNORMAL HIGH (ref 70–99)

## 2021-04-29 LAB — LIPID PANEL
Cholesterol: 86 mg/dL (ref 0–200)
HDL: 21 mg/dL — ABNORMAL LOW (ref >40)
LDL Cholesterol: 44 mg/dL (ref 0–99)
Total CHOL/HDL Ratio: 4.1 RATIO
Triglycerides: 107 mg/dL (ref <150)
VLDL: 21 mg/dL (ref 0–40)

## 2021-04-29 LAB — BASIC METABOLIC PANEL
Anion gap: 7 (ref 5–15)
BUN: 13 mg/dL (ref 8–23)
CO2: 27 mmol/L (ref 22–32)
Calcium: 8.3 mg/dL — ABNORMAL LOW (ref 8.9–10.3)
Chloride: 101 mmol/L (ref 98–111)
Creatinine, Ser: 0.58 mg/dL — ABNORMAL LOW (ref 0.61–1.24)
GFR, Estimated: >60 mL/min (ref >60)
Glucose, Bld: 111 mg/dL — ABNORMAL HIGH (ref 70–99)
Potassium: 4 mmol/L (ref 3.5–5.1)
Sodium: 135 mmol/L (ref 135–145)

## 2021-04-29 MED ORDER — LIDOCAINE HCL (PF) 1 % IJ SOLN: 10.0000 mL | Freq: Once | INTRAMUSCULAR | Status: AC

## 2021-04-29 MED ORDER — LIDOCAINE HCL (PF) 1 % IJ SOLN
INTRAMUSCULAR | Status: AC
  Administered 2021-04-29: 10 mL via INTRADERMAL
  Filled 2021-04-29: qty 30

## 2021-04-29 NOTE — Progress Notes (Signed)
[address]       [place], [address]             [phone]                 2 Days Post-Op Procedure(s) (LRB): AORTIC VALVE REPLACEMENT (AVR) USING INSPIRIS 25MM AORTIC VALVE (N/A) CORONARY ARTERY BYPASS GRAFTING (CABG) X  3 , USING LEFT INTERNAL MAMMARY ARTERY AND RIGHT ENDOSCOPIC GREATER SAPHENOUS VEIN CONDUITS (N/A) TRANSESOPHAGEAL ECHOCARDIOGRAM (TEE) (N/A) ENDOVEIN HARVEST OF GREATER SAPHENOUS VEIN (Right) APPLICATION OF CELL SAVER   Events: Remains orthostatic with ambulation Improving from yesterday _______________________________________________________________ Vitals: BP (!) 119/55 (BP Location: Right Arm)   Pulse 99   Temp 98.3 F (36.8 C) (Oral)   Resp 19   Ht 5\' 9"  (1.753 m)   Wt 80.7 kg   SpO2 94%   BMI 26.27 kg/m  Filed Weights   04/27/21 0616 04/28/21 0500 04/29/21 0600  Weight: 79.8 kg 82 kg 80.7 kg     - Neuro: alert NAD  - Cardiovascular: sinus  Drips: none.      - Pulm: EWOB    ABG    Component Value Date/Time   PHART 7.322 (L) 04/27/2021 2210   PCO2ART 43.4 04/27/2021 2210   PO2ART 91 04/27/2021 2210   HCO3 22.4 04/27/2021 2210   TCO2 24 04/27/2021 2210   ACIDBASEDEF 3.0 (H) 04/27/2021 2210   O2SAT 96.0 04/27/2021 2210    - Abd: ND - Extremity: trace edema  .Intake/Output      10/15 0701 10/16 0700 10/16 0701 10/17 0700   P.O. 1080    I.V. (mL/kg) 245.1 (3) 5.1 (0.1)   Blood     IV Piggyback 299.9 16   Total Intake(mL/kg) 1625 (20.1) 21.1 (0.3)   Urine (mL/kg/hr) 1880 (1)    Drains 33    Blood     Chest Tube 360    Total Output 2273    Net -648 +21.1           _______________________________________________________________ Labs: CBC Latest Ref Rng & Units 04/29/2021 04/28/2021 04/28/2021  WBC 4.0 - 10.5 K/uL 7.1 10.0 8.7  Hemoglobin 13.0 - 17.0 g/dL 7.3(L) 8.0(L) 7.9(L)  Hematocrit 39.0 - 52.0 % 22.4(L) 24.1(L) 23.8(L)  Platelets 150 - 400 K/uL 84(L) 108(L) 96(L)   CMP Latest Ref Rng &  Units 04/29/2021 04/28/2021 04/28/2021  Glucose 70 - 99 mg/dL 111(H) 153(H) 123(H)  BUN 8 - 23 mg/dL 13 13 12   Creatinine 0.61 - 1.24 mg/dL 0.58(L) 0.78 0.68  Sodium 135 - 145 mmol/L 135 133(L) 135  Potassium 3.5 - 5.1 mmol/L 4.0 4.5 4.8  Chloride 98 - 111 mmol/L 101 100 106  CO2 22 - 32 mmol/L 27 25 25   Calcium 8.9 - 10.3 mg/dL 8.3(L) 8.1(L) 8.0(L)  Total Protein 6.5 - 8.1 g/dL - - -  Total Bilirubin 0.3 - 1.2 mg/dL - - -  Alkaline Phos 38 - 126 U/L - - -  AST 15 - 41 U/L - - -  ALT 0 - 44 U/L - - -    CXR: Bilateral pneumoT.  Slightly worst   _______________________________________________________________  Assessment and Plan: POD 2 s/p AVR CABG.  Neuro: pain controlled CV: on midodrine.  Encouraging more PO.  On A/S/low dose BB.  Wires in place Pulm: will keep tubes for now.  Will place right pigtail Renal: creat stable.  Weight up slightly.  Holding on diuresis given orthostasis GI: on diet Heme: stable  ID: afebrile Endo: SSI Dispo: continue ICU care.  Floor tomorrow   Lajuana Matte 04/29/2021 9:02 AM

## 2021-04-29 NOTE — Progress Notes (Signed)
     [address]       [place] [address]             [phone]       Aborted pigtail catheter placement on the right pleural space. Patient was prepped and draped in normal sterile fashion.  Landmarks were identified and a needle was inserted into the fourth intercostal space.  There was no return of air upon entry into the pleural space.  There was some blood that was aspirated.  Given the size of the pneumothorax on chest x-ray and the lack of air that was released with a needle I elected to abort this procedure.  Additionally had a very difficult time attempting to pass the wire once in the pleural space.  CT scan was reviewed there is a small effusion on the right side along with a persistent small apical pneumothorax.  The patient does still have a left mediastinal chest tube in place.  He has remained hemodynamically stable.  Nicholas Wise

## 2021-04-29 NOTE — Anesthesia Postprocedure Evaluation (Signed)
Anesthesia Post Note  Patient: Nicholas Wise  Procedure(s) Performed: AORTIC VALVE REPLACEMENT (AVR) USING INSPIRIS 25MM AORTIC VALVE (Chest) CORONARY ARTERY BYPASS GRAFTING (CABG) X  3 , USING LEFT INTERNAL MAMMARY ARTERY AND RIGHT ENDOSCOPIC GREATER SAPHENOUS VEIN CONDUITS (Chest) TRANSESOPHAGEAL ECHOCARDIOGRAM (TEE) ENDOVEIN HARVEST OF GREATER SAPHENOUS VEIN (Right) APPLICATION OF CELL SAVER     Patient location during evaluation: ICU Anesthesia Type: General Level of consciousness: awake Pain management: pain level controlled Vital Signs Assessment: post-procedure vital signs reviewed and stable Respiratory status: spontaneous breathing, nonlabored ventilation and respiratory function stable Cardiovascular status: blood pressure returned to baseline and stable Postop Assessment: no apparent nausea or vomiting Anesthetic complications: no   No notable events documented.  Last Vitals:  Vitals:   04/29/21 1845 04/29/21 1900  BP: (!) 117/58 109/68  Pulse: 98 99  Resp: (!) 23 (!) 24  Temp:  36.9 C  SpO2: 100% 100%    Last Pain:  Vitals:   04/29/21 1900  TempSrc: Oral  PainSc:                  Babbie Dondlinger P Correne Lalani

## 2021-04-30 ENCOUNTER — Other Ambulatory Visit: Payer: Self-pay | Admitting: Student

## 2021-04-30 ENCOUNTER — Encounter (HOSPITAL_COMMUNITY): Payer: Self-pay | Admitting: Surgery

## 2021-04-30 ENCOUNTER — Inpatient Hospital Stay (HOSPITAL_COMMUNITY): Payer: Medicare Other

## 2021-04-30 DIAGNOSIS — I350 Nonrheumatic aortic (valve) stenosis: Secondary | ICD-10-CM

## 2021-04-30 DIAGNOSIS — Z953 Presence of xenogenic heart valve: Secondary | ICD-10-CM

## 2021-04-30 LAB — POCT I-STAT 7, (LYTES, BLD GAS, ICA,H+H)
Acid-base deficit: 1 mmol/L (ref 0.0–2.0)
Bicarbonate: 23.7 mmol/L (ref 20.0–28.0)
Calcium, Ion: 1.42 mmol/L — ABNORMAL HIGH (ref 1.15–1.40)
HCT: 19 % — ABNORMAL LOW (ref 39.0–52.0)
Hemoglobin: 6.5 g/dL — CL (ref 13.0–17.0)
O2 Saturation: 100 %
Potassium: 4.2 mmol/L (ref 3.5–5.1)
Sodium: 141 mmol/L (ref 135–145)
TCO2: 25 mmol/L (ref 22–32)
pCO2 arterial: 40.5 mmHg (ref 32.0–48.0)
pH, Arterial: 7.376 (ref 7.350–7.450)
pO2, Arterial: 209 mmHg — ABNORMAL HIGH (ref 83.0–108.0)

## 2021-04-30 LAB — CBC
HCT: 17.9 % — ABNORMAL LOW (ref 39.0–52.0)
HCT: 24.5 % — ABNORMAL LOW (ref 39.0–52.0)
Hemoglobin: 5.9 g/dL — CL (ref 13.0–17.0)
Hemoglobin: 8.4 g/dL — ABNORMAL LOW (ref 13.0–17.0)
MCH: 32.2 pg (ref 26.0–34.0)
MCH: 31.7 pg (ref 26.0–34.0)
MCHC: 33 g/dL (ref 30.0–36.0)
MCHC: 34.3 g/dL (ref 30.0–36.0)
MCV: 97.8 fL (ref 80.0–100.0)
MCV: 92.5 fL (ref 80.0–100.0)
Platelets: 92 10*3/uL — ABNORMAL LOW (ref 150–400)
Platelets: 100 10*3/uL — ABNORMAL LOW (ref 150–400)
RBC: 1.83 MIL/uL — ABNORMAL LOW (ref 4.22–5.81)
RBC: 2.65 MIL/uL — ABNORMAL LOW (ref 4.22–5.81)
RDW: 14.5 % (ref 11.5–15.5)
RDW: 16.6 % — ABNORMAL HIGH (ref 11.5–15.5)
WBC: 6.9 10*3/uL (ref 4.0–10.5)
WBC: 7.8 10*3/uL (ref 4.0–10.5)
nRBC: 0 % (ref 0.0–0.2)
nRBC: 0.5 % — ABNORMAL HIGH (ref 0.0–0.2)

## 2021-04-30 LAB — GLUCOSE, CAPILLARY
Glucose-Capillary: 84 mg/dL (ref 70–99)
Glucose-Capillary: 131 mg/dL — ABNORMAL HIGH (ref 70–99)
Glucose-Capillary: 100 mg/dL — ABNORMAL HIGH (ref 70–99)
Glucose-Capillary: 117 mg/dL — ABNORMAL HIGH (ref 70–99)
Glucose-Capillary: 134 mg/dL — ABNORMAL HIGH (ref 70–99)
Glucose-Capillary: 124 mg/dL — ABNORMAL HIGH (ref 70–99)

## 2021-04-30 LAB — BASIC METABOLIC PANEL
Anion gap: 5 (ref 5–15)
BUN: 13 mg/dL (ref 8–23)
CO2: 31 mmol/L (ref 22–32)
Calcium: 8 mg/dL — ABNORMAL LOW (ref 8.9–10.3)
Chloride: 104 mmol/L (ref 98–111)
Creatinine, Ser: 0.7 mg/dL (ref 0.61–1.24)
GFR, Estimated: >60 mL/min (ref >60)
Glucose, Bld: 136 mg/dL — ABNORMAL HIGH (ref 70–99)
Potassium: 3.3 mmol/L — ABNORMAL LOW (ref 3.5–5.1)
Sodium: 140 mmol/L (ref 135–145)

## 2021-04-30 LAB — SURGICAL PATHOLOGY

## 2021-04-30 LAB — PREPARE RBC (CROSSMATCH)

## 2021-04-30 MED ORDER — POTASSIUM CHLORIDE CRYS ER 20 MEQ PO TBCR
20.0000 meq | EXTENDED_RELEASE_TABLET | ORAL | Status: AC
  Administered 2021-04-30 – 2021-05-01 (×3): 20 meq via ORAL
  Filled 2021-04-30 (×3): qty 1

## 2021-04-30 MED ORDER — FUROSEMIDE 10 MG/ML IJ SOLN
40.0000 mg | Freq: Once | INTRAMUSCULAR | Status: AC
  Administered 2021-04-30: 40 mg via INTRAVENOUS
  Filled 2021-04-30: qty 4

## 2021-04-30 MED ORDER — MIDODRINE HCL 5 MG PO TABS
10.0000 mg | ORAL_TABLET | Freq: Three times a day (TID) | ORAL | Status: DC
Start: 2021-04-30 — End: 2021-05-02
  Administered 2021-04-30 – 2021-05-02 (×7): 10 mg via ORAL
  Filled 2021-04-30 (×7): qty 2

## 2021-04-30 MED ORDER — FE FUMARATE-B12-VIT C-FA-IFC PO CAPS
1.0000 | ORAL_CAPSULE | Freq: Two times a day (BID) | ORAL | Status: DC
  Administered 2021-04-30 – 2021-05-03 (×7): 1 via ORAL
  Filled 2021-04-30 (×7): qty 1

## 2021-04-30 MED ORDER — SODIUM CHLORIDE 0.9% IV SOLUTION: Freq: Once | INTRAVENOUS | Status: AC

## 2021-04-30 MED ORDER — ORAL CARE MOUTH RINSE
15.0000 mL | Freq: Two times a day (BID) | OROMUCOSAL | Status: DC
  Administered 2021-04-30 – 2021-05-03 (×5): 15 mL via OROMUCOSAL

## 2021-04-30 MED FILL — Thrombin (Recombinant) For Soln 20000 Unit: CUTANEOUS | Qty: 1 | Status: AC

## 2021-04-30 NOTE — Progress Notes (Signed)
Planned to get patient oob to walk. Orthostatic BP trials taken. The patient tolerated well. VSS (see flowsheet). Began to walk patient in room to bedside chair when patient experienced vagal-like episode. Patient became rigid and less responsive; HR 140s. Patient was placed back into bed immediately and patient became more responsive. VSS. Will continue to monitor.

## 2021-04-30 NOTE — Plan of Care (Signed)
  Problem: Education: Goal: Knowledge of General Education information will improve Description: Including pain rating scale, medication(s)/side effects and non-pharmacologic comfort measures Outcome: Progressing   Problem: Coping: Goal: Level of anxiety will decrease Outcome: Progressing   Problem: Elimination: Goal: Will not experience complications related to bowel motility Outcome: Progressing Goal: Will not experience complications related to urinary retention Outcome: Progressing   Problem: Pain Managment: Goal: General experience of comfort will improve Outcome: Progressing   

## 2021-04-30 NOTE — Plan of Care (Signed)

## 2021-04-30 NOTE — Progress Notes (Addendum)
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 Patient does not have much pain. He had a bowel movement yesterday. Upon standing to walk earlier this am, had a vagal like episode.He feels better now that he is in bed.  Objective: Vital signs in last 24 hours: Temp:  [98.1 F (36.7 C)-98.4 F (36.9 C)] 98.1 F (36.7 C) (10/17 0630) Pulse Rate:  [88-144] 115 (10/17 0600) Cardiac Rhythm: Sinus tachycardia;Normal sinus rhythm (10/17 0400) Resp:  [15-35] 27 (10/17 0600) BP: (66-138)/(34-105) 121/68 (10/17 0600) SpO2:  [86 %-100 %] 94 % (10/17 0600) Weight:  [81.7 kg] 81.7 kg (10/17 0500)  Filed Weights   04/28/21 0500 04/29/21 0600 04/30/21 0500  Weight: 82 kg 80.7 kg 81.7 kg    Weight change: 1 kg    Intake/Output from previous day: 10/16 0701 - 10/17 0700 In: 563 [P.O.:250; I.V.:296.9; IV Piggyback:16] Out: 1222 [Urine:1025; Drains:37; Chest Tube:160]  Intake/Output this shift: No intake/output data recorded.  Current Meds: Scheduled Meds:  acetaminophen  1,000 mg Oral Q6H   Or   acetaminophen (TYLENOL) oral liquid 160 mg/5 mL  1,000 mg Per Tube Q6H   allopurinol  300 mg Oral q AM   aspirin EC  325 mg Oral Daily   Or   aspirin  324 mg Per Tube Daily   atorvastatin  40 mg Oral QPM   bisacodyl  10 mg Oral Daily   Or   bisacodyl  10 mg Rectal Daily   chlorhexidine gluconate (MEDLINE KIT)  15 mL Mouth Rinse BID   Chlorhexidine Gluconate Cloth  6 each  Topical Daily   docusate sodium  200 mg Oral Daily   finasteride  5 mg Oral Daily   insulin aspart  0-24 Units Subcutaneous Q4H   metoprolol tartrate  12.5 mg Oral BID   Or   metoprolol tartrate  12.5 mg Per Tube BID   midodrine  5 mg Oral TID WC   pantoprazole  40 mg Oral Daily   polyvinyl alcohol  1 drop Both Eyes BID   sertraline  100 mg Oral QHS   sodium chloride  1 spray Each Nare BID   sodium chloride flush  10-40 mL Intracatheter Q12H   sodium chloride flush  3 mL Intravenous Q12H   tamsulosin  0.4 mg Oral BID   triamcinolone  1 spray Nasal q AM   Continuous Infusions:  sodium chloride Stopped (04/28/21 0816)   sodium chloride 250 mL (04/28/21 0500)   sodium chloride 20 mL/hr at 04/27/21 1500   lactated ringers     lactated ringers     lactated ringers 20 mL/hr at 04/30/21 0400   sodium chloride Stopped (04/27/21 2256)   PRN Meds:.sodium chloride, lactated ringers, metoprolol tartrate, midazolam, morphine injection, ondansetron (ZOFRAN) IV, oxyCODONE, pneumococcal 23 valent vaccine, [COMPLETED] albumin human **AND** sodium chloride, sodium chloride flush,  sodium chloride flush, traMADol  General appearance: alert, cooperative, and no distress Neurologic: intact Heart: Tachycardic Lungs: Diminished bibasilar breath sounds Abdomen: soft, non-tender; bowel sounds normal; no masses,  no organomegaly Extremities: Mild LE edema. JP drain with sero sanguinous drainage Wound: Sternal dressing removed and wound is clean and dry. RLE wounds are clean and dry. No sign of infection.  Lab Results: CBC: Recent Labs    04/29/21 0445 04/30/21 0631  WBC 7.1 6.9  HGB 7.3* 5.9*  HCT 22.4* 17.9*  PLT 84* 92*   BMET:  Recent Labs    04/28/21 1702 04/29/21 0445  NA 133* 135  K 4.5 4.0  CL 100 101  CO2 25 27  GLUCOSE 153* 111*  BUN 13 13  CREATININE 0.78 0.58*  CALCIUM 8.1* 8.3*    CMET: Lab Results  Component Value Date   WBC 6.9 04/30/2021   HGB 5.9 (LL) 04/30/2021    HCT 17.9 (L) 04/30/2021   PLT 92 (L) 04/30/2021   GLUCOSE 111 (H) 04/29/2021   CHOL 86 04/29/2021   TRIG 107 04/29/2021   HDL 21 (L) 04/29/2021   LDLCALC 44 04/29/2021   ALT 19 04/25/2021   AST 28 04/25/2021   NA 135 04/29/2021   K 4.0 04/29/2021   CL 101 04/29/2021   CREATININE 0.58 (L) 04/29/2021   BUN 13 04/29/2021   CO2 27 04/29/2021   INR 1.7 (H) 04/27/2021   HGBA1C 5.7 (H) 04/25/2021      PT/INR:  Recent Labs    04/27/21 1427  LABPROT 20.2*  INR 1.7*   Radiology: CT CHEST WO CONTRAST  Result Date: 04/29/2021 CLINICAL DATA:  Pneumothorax EXAM: CT CHEST WITHOUT CONTRAST TECHNIQUE: Multidetector CT imaging of the chest was performed following the standard protocol without IV contrast. COMPARISON:  Chest radiograph, 04/29/2021, 5:16 a.m. FINDINGS: Cardiovascular: Aortic atherosclerosis. 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 Normal heart size. Extensive 3 vessel coronary artery calcifications and/or stents. No pericardial effusion. Mediastinal drainage tubes. Mediastinum/Nodes: No enlarged mediastinal, hilar, or axillary lymph nodes. Thyroid gland, trachea, and esophagus demonstrate no significant findings. Lungs/Pleura: Small bilateral pleural effusions with associated atelectasis or consolidation. Small, approximately 20% volume right pneumothorax, as seen on prior radiographs. Tiny, less than 5% left apical pneumothorax with left-sided chest tube in position. Upper Abdomen: No acute abnormality. Musculoskeletal: No chest wall mass or suspicious bone lesions identified. IMPRESSION: 1. Small, approximately 20% volume right pneumothorax, as seen on prior radiographs. 2. Tiny, less than 5% left apical pneumothorax with left-sided chest tube in position. 3. Small bilateral pleural effusions with associated atelectasis or consolidation. 4. 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 Aortic Atherosclerosis (ICD10-I70.0).  Electronically Signed   By: Delanna Ahmadi M.D.   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 On Lopressor 12.5 mg bid and Midodrine 5 mg tid Pulmonary-on 4 liters of oxygen via [place]. Wean as able. Chest tubes with 160 cc last 24 hours. CXR this am appears stable. Encourage incentive spirometer. Remove chest tubes and blake drain Blood loss anemia-H and H decreased to 5.9 and 17.9. Will need transfusion. Will transfuse in the interim and then repeat  CBGs 105/84/131. Pre op HGA1C 5.7.  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 Will stop accu checks and SS PRN at transfer 6. Thrombocytopenia-platelets this am 92,000 7. Deconditioned-ambulate tid   Nani Skillern PA-C 04/30/2021 7:09 AM    Chart reviewed, patient examined, agree with above. Hgb this am 5.9, down from 7.3 yesterday am. He has borderline BP, sinus tachy 120 and dizzy when he gets up. Will transfuse 2 units PRBC's and start iron. He has no visible blood loss so I suspect it is probably RBC attrition and dilution. CXR looks good this am with no ptx or effusion seen. No air leak seen so DC chest tubes.

## 2021-04-30 NOTE — Progress Notes (Signed)
      [address]       [place] [address]             [phone]      POD # 3 AVR  Resting comfortably  BP 101/66 (BP Location: Left Arm)   Pulse 77   Temp 98.7 F (37.1 C) (Oral)   Resp 15   Ht 5\' 9"  (1.753 m)   Wt 81.7 kg   SpO2 93%   BMI 26.60 kg/m  4l  96% sat  Intake/Output Summary (Last 24 hours) at 04/30/2021 1703 Last data filed at 04/30/2021 1649 Gross per 24 hour  Intake 1047.91 ml  Output 2900 ml  Net -1852.09 ml   Hgb 8.4 up from 5.9 pre transfusion  Remo Lipps C. Roxan Hockey, MD [facility] [phone]

## 2021-04-30 NOTE — Progress Notes (Signed)
Ordered 6 week post-op Echo after AVR per protocol.  Patient's primary Cardiologist is Dr. Irish Lack. CT surgeon is Dr. Cyndia Bent.  Darreld Mclean, PA-C 04/30/2021 12:17 PM

## 2021-05-01 ENCOUNTER — Inpatient Hospital Stay (HOSPITAL_COMMUNITY): Payer: Medicare Other

## 2021-05-01 LAB — TYPE AND SCREEN
ABO/RH(D): A POS
Antibody Screen: NEGATIVE
Unit division: 0
Unit division: 0
Unit division: 0
Unit division: 0

## 2021-05-01 LAB — GLUCOSE, CAPILLARY
Glucose-Capillary: 107 mg/dL — ABNORMAL HIGH (ref 70–99)
Glucose-Capillary: 111 mg/dL — ABNORMAL HIGH (ref 70–99)
Glucose-Capillary: 135 mg/dL — ABNORMAL HIGH (ref 70–99)
Glucose-Capillary: 113 mg/dL — ABNORMAL HIGH (ref 70–99)
Glucose-Capillary: 119 mg/dL — ABNORMAL HIGH (ref 70–99)
Glucose-Capillary: 124 mg/dL — ABNORMAL HIGH (ref 70–99)

## 2021-05-01 LAB — BPAM RBC
Blood Product Expiration Date: 202211022359
Blood Product Expiration Date: 202211022359
Blood Product Expiration Date: 202211132359
Blood Product Expiration Date: 202211132359
ISSUE DATE / TIME: 202210141339
ISSUE DATE / TIME: 202210141339
ISSUE DATE / TIME: 202210170749
ISSUE DATE / TIME: 202210171109
Unit Type and Rh: 6200
Unit Type and Rh: 6200
Unit Type and Rh: 6200
Unit Type and Rh: 6200

## 2021-05-01 LAB — CBC
HCT: 25.5 % — ABNORMAL LOW (ref 39.0–52.0)
Hemoglobin: 8.6 g/dL — ABNORMAL LOW (ref 13.0–17.0)
MCH: 31.5 pg (ref 26.0–34.0)
MCHC: 33.7 g/dL (ref 30.0–36.0)
MCV: 93.4 fL (ref 80.0–100.0)
Platelets: 119 10*3/uL — ABNORMAL LOW (ref 150–400)
RBC: 2.73 MIL/uL — ABNORMAL LOW (ref 4.22–5.81)
RDW: 16.9 % — ABNORMAL HIGH (ref 11.5–15.5)
WBC: 7.4 10*3/uL (ref 4.0–10.5)
nRBC: 0.4 % — ABNORMAL HIGH (ref 0.0–0.2)

## 2021-05-01 LAB — BASIC METABOLIC PANEL
Anion gap: 8 (ref 5–15)
BUN: 15 mg/dL (ref 8–23)
CO2: 27 mmol/L (ref 22–32)
Calcium: 8.2 mg/dL — ABNORMAL LOW (ref 8.9–10.3)
Chloride: 103 mmol/L (ref 98–111)
Creatinine, Ser: 0.61 mg/dL (ref 0.61–1.24)
GFR, Estimated: >60 mL/min (ref >60)
Glucose, Bld: 139 mg/dL — ABNORMAL HIGH (ref 70–99)
Potassium: 3.7 mmol/L (ref 3.5–5.1)
Sodium: 138 mmol/L (ref 135–145)

## 2021-05-01 MED ORDER — ~~LOC~~ CARDIAC SURGERY, PATIENT & FAMILY EDUCATION: Freq: Once | Status: DC

## 2021-05-01 MED ORDER — SODIUM CHLORIDE 0.9% FLUSH: 3.0000 mL | INTRAVENOUS | Status: DC | PRN

## 2021-05-01 MED ORDER — SODIUM CHLORIDE 0.9% FLUSH
3.0000 mL | Freq: Two times a day (BID) | INTRAVENOUS | Status: DC
  Administered 2021-05-02 (×2): 3 mL via INTRAVENOUS

## 2021-05-01 MED ORDER — METOPROLOL TARTRATE 12.5 MG HALF TABLET
12.5000 mg | ORAL_TABLET | Freq: Two times a day (BID) | ORAL | Status: DC
  Administered 2021-05-01 – 2021-05-03 (×5): 12.5 mg via ORAL
  Filled 2021-05-01 (×5): qty 1

## 2021-05-01 MED ORDER — SODIUM CHLORIDE 0.9 % IV SOLN: 250.0000 mL | INTRAVENOUS | Status: DC | PRN

## 2021-05-01 NOTE — Progress Notes (Signed)
Patient ID: Nicholas Wise, male   DOB: 1945-10-03, 75 y.o.   MRN: [id number] TCTS Evening Rounds:  Hemodynamically stable in sinus rhythm.  Feels well. Has been up walking around, working on IS.  Waiting on 4E bed.

## 2021-05-01 NOTE — Progress Notes (Signed)
Epicardial pacing wires discontinued per order. Patient tolerated well. Post procedure instructions reviewed with patient. VSS.

## 2021-05-01 NOTE — Progress Notes (Addendum)
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 Patient states he does not have much pain.   Objective: Vital signs in last 24 hours: Temp:  [97.9 F (36.6 C)-98.7 F (37.1 C)] 97.9 F (36.6 C) (10/18 0355) Pulse Rate:  [66-248] 107 (10/18 0600) Cardiac Rhythm: Normal sinus rhythm (10/18 0400) Resp:  [15-31] 15 (10/17 1600) BP: (96-153)/(52-118) 144/69 (10/18 0600) SpO2:  [88 %-97 %] 91 % (10/18 0600) Weight:  [78.1 kg] 78.1 kg (10/18 0442)  Filed Weights   04/29/21 0600 04/30/21 0500 05/01/21 0442  Weight: 80.7 kg 81.7 kg 78.1 kg    Weight change: -3.6 kg    Intake/Output from previous day: 10/17 0701 - 10/18 0700 In: 1275.4 [P.O.:500; Blood:775.4] Out: 2375 [Urine:2375]  Intake/Output this shift: Total I/O In: -  Out: 375 [Urine:375]  Current Meds: Scheduled Meds:  acetaminophen  1,000 mg Oral Q6H   Or   acetaminophen (TYLENOL) oral liquid 160 mg/5 mL  1,000 mg Per Tube Q6H   allopurinol  300 mg Oral q AM   aspirin EC  325 mg Oral Daily   Or   aspirin  324 mg Per Tube Daily   atorvastatin  40 mg Oral QPM   Chlorhexidine Gluconate Cloth  6 each Topical Daily   docusate sodium  200 mg Oral Daily   ferrous [id number] C-folic acid  1 capsule Oral BID PC   finasteride  5 mg Oral Daily   mouth rinse  15 mL Mouth Rinse BID   midodrine  10 mg Oral TID WC   pantoprazole  40 mg Oral Daily   polyvinyl alcohol  1 drop Both Eyes BID   sertraline   100 mg Oral QHS   sodium chloride  1 spray Each Nare BID   sodium chloride flush  10-40 mL Intracatheter Q12H   sodium chloride flush  3 mL Intravenous Q12H   tamsulosin  0.4 mg Oral BID   triamcinolone  1 spray Nasal q AM   Continuous Infusions:  sodium chloride Stopped (04/28/21 0816)   sodium chloride 250 mL (04/28/21 0500)   sodium chloride 20 mL/hr at 04/27/21 1500   lactated ringers     lactated ringers 20 mL/hr at 04/30/21 0400   sodium chloride Stopped (04/27/21 2256)   PRN Meds:.sodium chloride, morphine injection, ondansetron (ZOFRAN) IV, oxyCODONE, pneumococcal 23 valent vaccine, [COMPLETED] albumin human **AND** sodium chloride, sodium chloride flush, sodium chloride flush, traMADol  General appearance: alert, cooperative, and no distress Neurologic: intact Heart: Tachycardic Lungs: Diminished bibasilar breath sounds Abdomen: soft, non-tender; bowel sounds normal; no masses,  no organomegaly Extremities: Mild LE edema. JP drain with sero sanguinous drainage Wound: Sternal dressing removed and wound is clean and dry. RLE wounds are clean and dry. No sign of infection.  Lab Results: CBC: Recent Labs    04/30/21 1512 05/01/21 0354  WBC 7.8 7.4  HGB 8.4* 8.6*  HCT 24.5* 25.5*  PLT 100* 119*    BMET:  Recent Labs    04/29/21 0445 04/30/21 1512  NA 135 140  K 4.0 3.3*  CL 101 104  CO2 27 31  GLUCOSE 111* 136*  BUN 13 13  CREATININE 0.58* 0.70  CALCIUM 8.3* 8.0*     CMET: Lab Results  Component Value Date   WBC 7.4 05/01/2021   HGB 8.6 (L) 05/01/2021   HCT 25.5 (L) 05/01/2021   PLT 119 (L) 05/01/2021   GLUCOSE 136 (H) 04/30/2021   CHOL 86 04/29/2021   TRIG 107 04/29/2021   HDL 21 (L) 04/29/2021   LDLCALC 44 04/29/2021   ALT 19 04/25/2021   AST 28 04/25/2021   NA 140 04/30/2021   K 3.3 (L) 04/30/2021   CL 104 04/30/2021   CREATININE 0.70 04/30/2021   BUN 13 04/30/2021   CO2 31 04/30/2021   INR 1.7 (H) 04/27/2021   HGBA1C 5.7 (H) 04/25/2021     PT/INR:  No results for input(s): LABPROT, INR in the last 72 hours.  Radiology: No results found.   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 On Midodrine 10 mg tid, Will decrease Midodrine to 5 mg tid and restart low dose Lopressor. Pulmonary-on 3 liters of oxygen via [place]. Wean as able. CXR this am appears to show . Encourage incentive spirometer. Remove sleeve, EPW Blood loss anemia-H and H increased to 8.6 and 25.5 after 2 units PRBCs yesterday. Continue Trinsicon CBGs 134/124/111. Pre op HGA1C 5.7.  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 Will stop accu checks and SS PRN at transfer 6. Thrombocytopenia-platelets this am up to 119,000 7. Deconditioned-ambulate tid 8. Transfer to 4E  Donielle Liston Alba PA-C 05/01/2021 6:54 AM     Chart reviewed, patient examined, agree with above. Hgb improved after transfusion yesterday. Feels better and ambulated. CXR this am shows small right ptx that is unchanged from yesterday. Will repeat CXR in am.

## 2021-05-02 ENCOUNTER — Inpatient Hospital Stay (HOSPITAL_COMMUNITY): Payer: Medicare Other

## 2021-05-02 LAB — CBC
HCT: 23.4 % — ABNORMAL LOW (ref 39.0–52.0)
Hemoglobin: 7.7 g/dL — ABNORMAL LOW (ref 13.0–17.0)
MCH: 31 pg (ref 26.0–34.0)
MCHC: 32.9 g/dL (ref 30.0–36.0)
MCV: 94.4 fL (ref 80.0–100.0)
Platelets: 139 10*3/uL — ABNORMAL LOW (ref 150–400)
RBC: 2.48 MIL/uL — ABNORMAL LOW (ref 4.22–5.81)
RDW: 16 % — ABNORMAL HIGH (ref 11.5–15.5)
WBC: 7.2 10*3/uL (ref 4.0–10.5)
nRBC: 1.1 % — ABNORMAL HIGH (ref 0.0–0.2)

## 2021-05-02 LAB — BASIC METABOLIC PANEL
Anion gap: 6 (ref 5–15)
BUN: 15 mg/dL (ref 8–23)
CO2: 26 mmol/L (ref 22–32)
Calcium: 8.3 mg/dL — ABNORMAL LOW (ref 8.9–10.3)
Chloride: 101 mmol/L (ref 98–111)
Creatinine, Ser: 0.59 mg/dL — ABNORMAL LOW (ref 0.61–1.24)
GFR, Estimated: >60 mL/min (ref >60)
Glucose, Bld: 105 mg/dL — ABNORMAL HIGH (ref 70–99)
Potassium: 3.9 mmol/L (ref 3.5–5.1)
Sodium: 133 mmol/L — ABNORMAL LOW (ref 135–145)

## 2021-05-02 MED ORDER — DIPHENHYDRAMINE HCL 25 MG PO CAPS
25.0000 mg | ORAL_CAPSULE | Freq: Once | ORAL | Status: AC
  Administered 2021-05-02: 25 mg via ORAL
  Filled 2021-05-02: qty 1

## 2021-05-02 MED ORDER — MIDODRINE HCL 5 MG PO TABS
5.0000 mg | ORAL_TABLET | Freq: Three times a day (TID) | ORAL | Status: DC
  Administered 2021-05-02 (×2): 5 mg via ORAL
  Filled 2021-05-02 (×2): qty 1

## 2021-05-02 MED FILL — Electrolyte-R (PH 7.4) Solution: INTRAVENOUS | Qty: 4000 | Status: AC

## 2021-05-02 MED FILL — Sodium Bicarbonate IV Soln 8.4%: INTRAVENOUS | Qty: 50 | Status: AC

## 2021-05-02 MED FILL — Mannitol IV Soln 20%: INTRAVENOUS | Qty: 500 | Status: AC

## 2021-05-02 MED FILL — Sodium Chloride IV Soln 0.9%: INTRAVENOUS | Qty: 1000 | Status: AC

## 2021-05-02 MED FILL — Calcium Chloride Inj 10%: INTRAVENOUS | Qty: 10 | Status: AC

## 2021-05-02 NOTE — Progress Notes (Signed)
      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   Objective: Vital signs in last 24 hours: Temp:  [98 F (36.7 C)-100 F (37.8 C)] 99 F (37.2 C) (10/19 0808) Pulse Rate:  [81-109] 81 (10/19 0808) Cardiac Rhythm: Normal sinus rhythm (10/19 0700) Resp:  [20-22] 20 (10/19 0808) BP: (96-151)/(61-95) 110/68 (10/19 0808) SpO2:  [88 %-99 %] 94 % (10/19 0808) Weight:  [78.1 kg] 78.1 kg (10/19 0652)     Intake/Output from previous day: No intake/output data recorded. Intake/Output this shift: No intake/output data recorded.  General appearance: alert, cooperative, and no distress Heart: regular rate and rhythm, S1, S2 normal, no murmur, click, rub or gallop Lungs: clear to auscultation bilaterally Abdomen: soft, non-tender; bowel sounds normal; no masses,  no organomegaly Extremities: extremities normal, atraumatic, no cyanosis or edema Wound: clean and dry  Lab Results: Recent Labs    05/01/21 0354 05/02/21 0124  WBC 7.4 7.2  HGB 8.6* 7.7*  HCT 25.5* 23.4*  PLT 119* 139*   BMET:  Recent Labs    05/01/21 0741 05/02/21 0124  NA 138 133*  K 3.7 3.9  CL 103 101  CO2 27 26  GLUCOSE 139* 105*  BUN 15 15  CREATININE 0.61 0.59*  CALCIUM 8.2* 8.3*    PT/INR: No results for input(s): LABPROT, INR in the last 72 hours. 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 Will change Midodrine to 5 mg tid, continue low dose Lopressor. Pulmonary-on room air with good oxygen saturation. CXR this am shows a stable right hydropneumothorax . Encourage incentive spirometer. Renal-creatinine 0.59, electrolytes okay. Good urine output., Weight is stable.  Blood loss anemia-H and H decreased to 7.7/23.4. No overt source of bleeding.  Continue Trinsicon CBGs well controlled. Pre op HGA1C 5.7.  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  6. Thrombocytopenia-platelets this am up to 139,000 7. Deconditioned-ambulate tid  Plan: Continue to ambulate in the halls. He was transferred up from the unit early this morning. Overall, he is doing well and should be able to discharge home tomorrow with his family.    LOS: 5 days    Elgie Collard 05/02/2021

## 2021-05-02 NOTE — Care Management Important Message (Signed)
Important Message  Patient Details  Name: CRESPIN FORSTROM MRN: [id number] Date of Birth: 11/19/1945   Medicare Important Message Given:  Yes     Shelda Altes 05/02/2021, 10:42 AM

## 2021-05-02 NOTE — Progress Notes (Signed)
Patient ambulated independently in hall with rolling walker. Wife present. Ambulation distance 820 ft.  Daymon Larsen, RN

## 2021-05-02 NOTE — Progress Notes (Signed)
CARDIAC REHAB PHASE I   PRE:  Rate/Rhythm: 84 SR    BP: sitting 110/68    SaO2: 90 RA lying  MODE:  Ambulation: 470 ft   POST:  Rate/Rhythm: 122 ST    BP: sitting 132/74     SaO2: 92 RA, 98 RA with rest  Pt moved out of bed required verbal cues for sternal precautions. Stood with min assist. Ambulated with RW, steady, no c/o. HR elevated but not significantly SOB. To recliner. Can ambulate without RW but prefers to use it. Has RW at home. He can ambulate independently. 1500 ml on IS. Wife present. Nicholas Wise, ACSM 05/02/2021 8:50 AM

## 2021-05-03 MED ORDER — OXYCODONE HCL 5 MG PO TABS: 5.0000 mg | ORAL_TABLET | ORAL | 0 refills | Status: DC | PRN

## 2021-05-03 MED ORDER — ASPIRIN 325 MG PO TBEC: 325.0000 mg | DELAYED_RELEASE_TABLET | Freq: Every day | ORAL | 0 refills | Status: DC

## 2021-05-03 MED ORDER — METOPROLOL TARTRATE 25 MG PO TABS: 12.5000 mg | ORAL_TABLET | Freq: Two times a day (BID) | ORAL | 1 refills | Status: DC

## 2021-05-03 MED FILL — Potassium Chloride Inj 2 mEq/ML: INTRAVENOUS | Qty: 40 | Status: AC

## 2021-05-03 MED FILL — Lidocaine HCl Local Preservative Free (PF) Inj 2%: INTRAMUSCULAR | Qty: 15 | Status: AC

## 2021-05-03 MED FILL — Heparin Sodium (Porcine) Inj 1000 Unit/ML: Qty: 1000 | Status: AC

## 2021-05-03 NOTE — Progress Notes (Signed)
Discussed with pt and wife IS, sternal precautions, diet, exercise, and CRPII. Receptive, will refer to [facility].  [place] CES, ACSM 9:58 AM 05/03/2021

## 2021-05-03 NOTE — Progress Notes (Signed)
Discharge instructions provided to patient. Medication changes, follow-up appointments, incision care, and sternal precautions reviewed. All questions answered. IV removed. Patient to be escorted home by his wife.  Gailen Shelter RN

## 2021-05-03 NOTE — Progress Notes (Signed)
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 Feels ready to go home. Ambulating well.  Objective: Vital signs in last 24 hours: Temp:  [97.9 F (36.6 C)-100.1 F (37.8 C)] 100.1 F (37.8 C) (10/20 0312) Pulse Rate:  [70-81] 70 (10/20 0027) Cardiac Rhythm: Normal sinus rhythm (10/19 1916) Resp:  [18-20] 19 (10/20 0312) BP: (109-155)/(49-72) 155/70 (10/20 0312) SpO2:  [94 %-98 %] 98 % (10/20 0027)  Hemodynamic parameters for last 24 hours:    Intake/Output from previous day: 10/19 0701 - 10/20 0700 In: -  Out: 100 [Urine:100] Intake/Output this shift: No intake/output data recorded.  General appearance: alert and cooperative Heart: regular rate and rhythm, S1, S2 normal, no murmur, click, rub or gallop Lungs: clear to auscultation bilaterally Wound: chest incision healing well. Small amount of serous drainage from the right leg drain site. No edema  Lab Results: Recent Labs    05/01/21 0354 05/02/21 0124  WBC 7.4 7.2  HGB 8.6* 7.7*  HCT 25.5* 23.4*  PLT 119* 139*   BMET:  Recent Labs    05/01/21 0741 05/02/21 0124  NA 138 133*  K 3.7 3.9  CL 103 101  CO2 27 26  GLUCOSE 139* 105*  BUN 15 15  CREATININE 0.61 0.59*  CALCIUM 8.2* 8.3*    PT/INR: No results for input(s): LABPROT, INR in the last 72 hours. 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 DC midodrine. Continue Lopressor.  Wt less than preop.  He looks good overall and will plan home today. CT sutures out in office in one week and I will see in 2-3 weeks.   LOS: 6 days    Gaye Pollack 05/03/2021

## 2021-05-03 NOTE — Progress Notes (Signed)
Patient's wife called and report that patient woke up and get confused. We ambulated with the walker in the hallway to the nursing station. Patient ask to stop. Pt back in the room, bed in the lowest position, called bell within reach, wife at the bedside. We'll continue to monitor.

## 2021-05-07 ENCOUNTER — Telehealth (HOSPITAL_COMMUNITY): Payer: Self-pay

## 2021-05-07 NOTE — Telephone Encounter (Signed)
Per phase I cardiac rehab, fac cardiac rehab referral to [facility] cardiac rehab.

## 2021-05-14 ENCOUNTER — Ambulatory Visit (INDEPENDENT_AMBULATORY_CARE_PROVIDER_SITE_OTHER): Payer: Self-pay

## 2021-05-14 ENCOUNTER — Other Ambulatory Visit: Payer: Self-pay

## 2021-05-14 DIAGNOSIS — Z4802 Encounter for removal of sutures: Secondary | ICD-10-CM

## 2021-05-14 NOTE — Progress Notes (Signed)
Patient arrived for nurse visit to remove suture/staples post- procedure AVR/ CABG with Dr. Cyndia Bent 04/27/21.  3 sutures removed with no signs/ symptoms of infection noted.  Patient tolerated procedure well.  Patient instructed to keep the incision sites clean and dry.  Patient acknowledged instructions given. Patient is aware of follow-up appointment.

## 2021-05-20 DIAGNOSIS — E782 Mixed hyperlipidemia: Secondary | ICD-10-CM | POA: Insufficient documentation

## 2021-05-20 DIAGNOSIS — I6521 Occlusion and stenosis of right carotid artery: Secondary | ICD-10-CM | POA: Insufficient documentation

## 2021-05-20 DIAGNOSIS — I77810 Thoracic aortic ectasia: Secondary | ICD-10-CM | POA: Insufficient documentation

## 2021-05-20 NOTE — Progress Notes (Signed)
Cardiology Office Note:    Date:  05/21/2021   ID:  Nicholas Wise, DOB 1946/04/20, MRN [id number]  PCP:  Josem Kaufmann, MD   [facility] Providers Cardiologist:  Larae Grooms, MD    Referring MD: Josem Kaufmann, MD   Chief Complaint:  Hospitalization Follow-up (S/p CABG+AVR)    Patient Profile:   Nicholas Wise is a 75 y.o. male with:  Aortic stenosis Coronary artery disease S/p CABG + bioprosthetic AVR 04/2021 Carotid artery disease [place] 10/22: R ICA 100; L ICA 60-79 History of TIA in 2015 History of multiple back surgeries S/p R TKR in 05/2019 Hyperlipidemia Dilated ascending aorta Echo 6/22: 40 mm  History of Present Illness: Mr. Imbert was recently noted to have worsening aortic stenosis now in the severe range.  He was referred for cardiac catheterization which demonstrated three-vessel CAD.  He was referred to cardiothoracic surgery and underwent CABG x3 (LIMA-LAD, SVG-Dx, SVG-PDA) and bioprosthetic aortic valve replacement with Dr. Cyndia Bent.  He was admitted 10/14-10/20.  His postoperative course was notable for orthostatic hypotension, right and left pneumothoraces, anemia requiring transfusion and thrombocytopenia.  He was briefly placed on Midodrine to control his low blood pressures.  Chest tube placement was attempted on the right but this was unsuccessful.  His pneumothoraces were followed conservatively with serial chest x-rays.  His platelet count eventually improved.    He returns for follow-up.  He is here with his wife.  Since discharge, he has done well.  He typically feels fatigued in the mornings.  This is overall improving.  His weight is down about 10 pounds.  He is walking 4 times a day and walks a total distance of 2 miles in 1 day.  He has not had any chest soreness.  He has not had significant shortness of breath.  He sleeps on an incline chronically.  He has not had lower extremity edema.  ASSESSMENT & PLAN:   S/P aortic valve replacement with  bioprosthetic valve He seems to be progressing well since his bioprosthetic AVR + CABG.  I have encouraged him to pursue cardiac rehab in [place] where he lives.  He has follow-up with Dr. Vivi Martens office next week.  His aortic valve sounds good on exam.  He already has an echocardiogram scheduled for 11/28.  We discussed the importance of SBE prophylaxis.  Follow-up with Dr. Irish Lack in 3 months.  Coronary artery disease As noted, he is doing well status post CABG + AVR.  He is not having anginal symptoms.  He will need to remain on full dose aspirin for total of 90 days post CABG and then can transition to 81 mg daily.  Continue atorvastatin 40 mg daily.  His heart rate is elevated and he has several PACs on electrocardiogram today.  His blood pressure is somewhat higher.  I have asked him to increase his metoprolol tartrate to 25 mg twice daily.  Occlusion of right carotid artery Pre-CABG Dopplers demonstrated an occluded right ICA and 60-79% stenosis on the left.  Continue aspirin, statin therapy.  I will arrange a follow-up carotid ultrasound in 6 months.  Mixed hyperlipidemia Recent LDL optimal.  Continue atorvastatin 40 mg daily.  Ascending aorta dilation (HCC) 40 mm on echocardiogram in June 2022.  He has a follow-up echocardiogram pending later this month.  If the size increases over time, we will need to consider proceeding with CT versus MRA.  Blood loss anemia He is currently taking iron.  He required transfusion with  PRBCs x3 in the hospital.  He still has some fatigue.  I will obtain a follow-up CBC today as well as a BMET.  Premature atrial contractions Asymptomatic.  Increase metoprolol tartrate to 25 mg twice daily as noted.  Obtain follow-up BMET today.            Dispo:  Return in about 3 months (around 08/21/2021) for Routine Follow Up w/ Dr. Irish Lack.    Prior CV studies: Pre-CABG Dopplers 04/25/2021 R ICA 100; L ICA 60-79  R/L cardiac catheterization 03/27/2021 LM  ostial 25 LAD proximal-mid 75, mid 25; D1 50 RCA mid 90  Echocardiogram 01/08/2021 EF 60-65, no RWMA, normal RVSF, mean AV gradient 40, moderate dilation of ascending aorta (40 mm)    Past Medical History:  Diagnosis Date   Anxiety    Aortic stenosis    Arthritis    Complication of anesthesia    bladder spasms after neck surgery. sent home with a catheter   Coronary artery disease    Depression    Hemorrhoids    Hiatal hernia    High cholesterol    History of kidney stones    Hx of adenomatous polyp of colon 01/31/2017   Numbness and tingling in left hand    Stroke ([place]) 11/09/2013   Tia    TIA (transient ischemic attack)    Urinary frequency    Current Medications: Current Meds  Medication Sig   acetaminophen (TYLENOL) 500 MG tablet Take 1,000 mg by mouth in the morning and at bedtime.   allopurinol (ZYLOPRIM) 300 MG tablet Take 300 mg by mouth in the morning.   ALPRAZolam (XANAX) 0.5 MG tablet Take 0.25 mg by mouth 2 (two) times daily as needed for anxiety.   aspirin EC 325 MG EC tablet Take 1 tablet (325 mg total) by mouth daily.   atorvastatin (LIPITOR) 40 MG tablet Take 40 mg by mouth every evening.    cholecalciferol (VITAMIN D3) 25 MCG (1000 UNIT) tablet Take 1,000 Units by mouth daily.   ferrous sulfate (FERROUSUL) 325 (65 FE) MG tablet Take 1 tablet (325 mg total) by mouth daily with breakfast.   finasteride (PROSCAR) 5 MG tablet Take 5 mg by mouth daily.   fluorouracil (EFUDEX) 5 % cream Apply 1 application topically daily as needed (sun damage).   Polyethyl Glycol-Propyl Glycol (SYSTANE OP) Place 1 drop into both eyes in the morning and at bedtime.   potassium citrate (UROCIT-K) 10 MEQ (1080 MG) SR tablet Take 10 mEq by mouth 2 (two) times daily.   Saline (SIMPLY SALINE) 0.9 % AERS Place 1 spray into the nose in the morning and at bedtime.   sertraline (ZOLOFT) 100 MG tablet Take 100 mg by mouth at bedtime.   tamsulosin (FLOMAX) 0.4 MG CAPS capsule Take 0.4 mg by  mouth 2 (two) times daily.   triamcinolone (NASACORT) 55 MCG/ACT AERO nasal inhaler Place 1 spray into the nose in the morning.   [DISCONTINUED] metoprolol tartrate (LOPRESSOR) 25 MG tablet Take 0.5 tablets (12.5 mg total) by mouth 2 (two) times daily.    Allergies:   Patient has no known allergies.   Social History   Tobacco Use   Smoking status: Never   Smokeless tobacco: Never  Vaping Use   Vaping Use: Never used  Substance Use Topics   Alcohol use: Not Currently   Drug use: No    Family Hx: The patient's family history includes Diabetes in his father; Heart disease in his father. There is  no history of Colon cancer.  Review of Systems  Constitutional: Positive for decreased appetite.    EKGs/Labs/Other Test Reviewed:    EKG:  EKG is   ordered today.  The ekg ordered today demonstrates NSR, HR 90, normal axis, right bundle branch block, PACs, QTC 467  Recent Labs: 04/25/2021: ALT 19 04/28/2021: Magnesium 2.0 05/02/2021: BUN 15; Creatinine, Ser 0.59; Hemoglobin 7.7; Platelets 139; Potassium 3.9; Sodium 133   Recent Lipid Panel Lab Results  Component Value Date/Time   CHOL 86 04/29/2021 04:45 AM   TRIG 107 04/29/2021 04:45 AM   HDL 21 (L) 04/29/2021 04:45 AM   LDLCALC 44 04/29/2021 04:45 AM     Risk Assessment/Calculations:          Physical Exam:    VS:  BP 140/80   Pulse 90   Ht 5\' 9"  (1.753 m)   Wt 165 lb 3.2 oz (74.9 kg)   SpO2 95%   BMI 24.40 kg/m     Wt Readings from Last 3 Encounters:  05/21/21 165 lb 3.2 oz (74.9 kg)  05/02/21 172 lb 2.9 oz (78.1 kg)  04/25/21 168 lb 11.2 oz (76.5 kg)    Constitutional:      Appearance: Healthy appearance. Not in distress.  Neck:     Vascular: JVD normal.  Pulmonary:     Effort: Pulmonary effort is normal.     Breath sounds: No wheezing. No rales.  Chest:     Comments: Median sternotomy well healed without erythema or d/c Cardiovascular:     Normal rate. Regular rhythm. Normal S1. Normal S2.       Murmurs: There is no murmur.  Edema:    Peripheral edema absent.  Abdominal:     Palpations: Abdomen is soft.  Skin:    General: Skin is warm and dry.  Neurological:     General: No focal deficit present.     Mental Status: Alert and oriented to person, place and time.     Cranial Nerves: Cranial nerves are intact.       Medication Adjustments/Labs and Tests Ordered: Current medicines are reviewed at length with the patient today.  Concerns regarding medicines are outlined above.  Tests Ordered: Orders Placed This Encounter  Procedures   Basic Metabolic Panel (BMET)   CBC   EKG 12-Lead   VAS US CAROTID   Medication Changes: Meds ordered this encounter  Medications   metoprolol tartrate (LOPRESSOR) 25 MG tablet    Sig: Take 1 tablet (25 mg total) by mouth 2 (two) times daily.    Dispense:  180 tablet    Refill:  3   ferrous sulfate (FERROUSUL) 325 (65 FE) MG tablet    Sig: Take 1 tablet (325 mg total) by mouth daily with breakfast.    Dispense:  90 tablet    Refill:  3   Signed, Richardson Dopp, PA-C  05/21/2021 12:13 PM    [facility] [address], [place], [place]  [address] Phone: [phone]; Fax: [phone]

## 2021-05-21 ENCOUNTER — Encounter: Payer: Self-pay | Admitting: Physician Assistant

## 2021-05-21 ENCOUNTER — Other Ambulatory Visit: Payer: Self-pay

## 2021-05-21 ENCOUNTER — Ambulatory Visit (INDEPENDENT_AMBULATORY_CARE_PROVIDER_SITE_OTHER): Payer: Medicare Other | Admitting: Physician Assistant

## 2021-05-21 VITALS — BP 140/80 | HR 90 | Ht 69.0 in | Wt 165.2 lb

## 2021-05-21 DIAGNOSIS — I2510 Atherosclerotic heart disease of native coronary artery without angina pectoris: Secondary | ICD-10-CM | POA: Diagnosis not present

## 2021-05-21 DIAGNOSIS — Z951 Presence of aortocoronary bypass graft: Secondary | ICD-10-CM | POA: Diagnosis not present

## 2021-05-21 DIAGNOSIS — I77810 Thoracic aortic ectasia: Secondary | ICD-10-CM

## 2021-05-21 DIAGNOSIS — Z953 Presence of xenogenic heart valve: Secondary | ICD-10-CM | POA: Diagnosis not present

## 2021-05-21 DIAGNOSIS — I350 Nonrheumatic aortic (valve) stenosis: Secondary | ICD-10-CM | POA: Diagnosis not present

## 2021-05-21 DIAGNOSIS — I6521 Occlusion and stenosis of right carotid artery: Secondary | ICD-10-CM

## 2021-05-21 DIAGNOSIS — D500 Iron deficiency anemia secondary to blood loss (chronic): Secondary | ICD-10-CM

## 2021-05-21 DIAGNOSIS — E782 Mixed hyperlipidemia: Secondary | ICD-10-CM

## 2021-05-21 DIAGNOSIS — I491 Atrial premature depolarization: Secondary | ICD-10-CM | POA: Insufficient documentation

## 2021-05-21 LAB — CBC
Hematocrit: 34.7 % — ABNORMAL LOW (ref 37.5–51.0)
Hemoglobin: 11.2 g/dL — ABNORMAL LOW (ref 13.0–17.7)
MCH: 30.1 pg (ref 26.6–33.0)
MCHC: 32.3 g/dL (ref 31.5–35.7)
MCV: 93 fL (ref 79–97)
Platelets: 357 10*3/uL (ref 150–450)
RBC: 3.72 x10E6/uL — ABNORMAL LOW (ref 4.14–5.80)
RDW: 13.4 % (ref 11.6–15.4)
WBC: 6.1 10*3/uL (ref 3.4–10.8)

## 2021-05-21 LAB — BASIC METABOLIC PANEL
BUN/Creatinine Ratio: 27 — ABNORMAL HIGH (ref 10–24)
BUN: 18 mg/dL (ref 8–27)
CO2: 27 mmol/L (ref 20–29)
Calcium: 9.9 mg/dL (ref 8.6–10.2)
Chloride: 99 mmol/L (ref 96–106)
Creatinine, Ser: 0.66 mg/dL — ABNORMAL LOW (ref 0.76–1.27)
Glucose: 90 mg/dL (ref 70–99)
Potassium: 5.2 mmol/L (ref 3.5–5.2)
Sodium: 138 mmol/L (ref 134–144)
eGFR: 98 mL/min/{1.73_m2} (ref >59)

## 2021-05-21 MED ORDER — METOPROLOL TARTRATE 25 MG PO TABS: 25.0000 mg | ORAL_TABLET | Freq: Two times a day (BID) | ORAL | 3 refills | Status: DC

## 2021-05-21 MED ORDER — FERROUS SULFATE 325 (65 FE) MG PO TABS: 325.0000 mg | ORAL_TABLET | Freq: Every day | ORAL | 3 refills | Status: DC

## 2021-05-21 NOTE — Assessment & Plan Note (Signed)
40 mm on echocardiogram in June 2022.  He has a follow-up echocardiogram pending later this month.  If the size increases over time, we will need to consider proceeding with CT versus MRA.

## 2021-05-21 NOTE — Assessment & Plan Note (Addendum)
He seems to be progressing well since his bioprosthetic AVR + CABG.  I have encouraged him to pursue cardiac rehab in [place] where he lives.  He has follow-up with Dr. Vivi Martens office next week.  His aortic valve sounds good on exam.  He already has an echocardiogram scheduled for 11/28.  We discussed the importance of SBE prophylaxis.  Follow-up with Dr. Irish Lack in 3 months.

## 2021-05-21 NOTE — Patient Instructions (Signed)
Medication Instructions:   INCREASE Metoprolol one tablet by mouth ( 25 mg) twice daily  *If you need a refill on your cardiac medications before your next appointment, please call your pharmacy*   Lab Work:  TODAY!!!!!BMET/CBC  If you have labs (blood work) drawn today and your tests are completely normal, you will receive your results only by: [place] (if you have MyChart) OR A paper copy in the mail If you have any lab test that is abnormal or we need to change your treatment, we will call you to review the results.   Testing/Procedures: . Your physician has requested that you have a carotid duplex. Wednesday,MAY 17 @ 11:00 AM. This test is an ultrasound of the carotid arteries in your neck. It looks at blood flow through these arteries that supply the brain with blood. Allow one hour for this exam. There are no restrictions or special instructions.    Follow-Up: At [facility], you and your health needs are our priority.  As part of our continuing mission to provide you with exceptional heart care, we have created designated Provider Care Teams.  These Care Teams include your primary Cardiologist (physician) and Advanced Practice Providers (APPs -  Physician Assistants and Nurse Practitioners) who all work together to provide you with the care you need, when you need it.  We recommend signing up for the patient portal called "MyChart".  Sign up information is provided on this After Visit Summary.  MyChart is used to connect with patients for Virtual Visits (Telemedicine).  Patients are able to view lab/test results, encounter notes, upcoming appointments, etc.  Non-urgent messages can be sent to your provider as well.   To learn more about what you can do with MyChart, go to [email or web address].    Your next appointment:   3 month(s)  The format for your next appointment:   In Person  Provider:   Larae Grooms, MD     Other Instructions  -NONE

## 2021-05-21 NOTE — Assessment & Plan Note (Signed)
Pre-CABG Dopplers demonstrated an occluded right ICA and 60-79% stenosis on the left.  Continue aspirin, statin therapy.  I will arrange a follow-up carotid ultrasound in 6 months.

## 2021-05-21 NOTE — Assessment & Plan Note (Signed)
Recent LDL optimal.  Continue atorvastatin 40 mg daily.

## 2021-05-21 NOTE — Assessment & Plan Note (Signed)
Asymptomatic.  Increase metoprolol tartrate to 25 mg twice daily as noted.  Obtain follow-up BMET today.

## 2021-05-21 NOTE — Assessment & Plan Note (Signed)
He is currently taking iron.  He required transfusion with PRBCs x3 in the hospital.  He still has some fatigue.  I will obtain a follow-up CBC today as well as a BMET.

## 2021-05-21 NOTE — Assessment & Plan Note (Signed)
As noted, he is doing well status post CABG + AVR.  He is not having anginal symptoms.  He will need to remain on full dose aspirin for total of 90 days post CABG and then can transition to 81 mg daily.  Continue atorvastatin 40 mg daily.  His heart rate is elevated and he has several PACs on electrocardiogram today.  His blood pressure is somewhat higher.  I have asked him to increase his metoprolol tartrate to 25 mg twice daily.

## 2021-05-25 ENCOUNTER — Other Ambulatory Visit: Payer: Self-pay | Admitting: Physician Assistant

## 2021-05-29 ENCOUNTER — Other Ambulatory Visit: Payer: Self-pay | Admitting: Surgery

## 2021-05-29 DIAGNOSIS — Z951 Presence of aortocoronary bypass graft: Secondary | ICD-10-CM

## 2021-05-30 ENCOUNTER — Ambulatory Visit: Payer: Medicare Other

## 2021-05-30 ENCOUNTER — Other Ambulatory Visit: Payer: Self-pay

## 2021-05-30 ENCOUNTER — Ambulatory Visit
Admission: RE | Admit: 2021-05-30 | Discharge: 2021-05-30 | Disposition: A | Discharge status: Home / Self Care | Payer: Medicare Other | Source: Ambulatory Visit | Attending: Surgery | Admitting: Surgery

## 2021-05-30 ENCOUNTER — Ambulatory Visit (INDEPENDENT_AMBULATORY_CARE_PROVIDER_SITE_OTHER): Payer: Medicare Other | Admitting: Surgical

## 2021-05-30 VITALS — BP 134/77 | HR 86 | Resp 20 | Ht 69.0 in | Wt 166.0 lb

## 2021-05-30 DIAGNOSIS — I2510 Atherosclerotic heart disease of native coronary artery without angina pectoris: Secondary | ICD-10-CM

## 2021-05-30 DIAGNOSIS — Z951 Presence of aortocoronary bypass graft: Secondary | ICD-10-CM

## 2021-05-30 NOTE — Patient Instructions (Signed)
Discussed activity progression and restrictions including driving.

## 2021-05-30 NOTE — Progress Notes (Signed)
[address]       [name] [address]             [phone]      Nicholas Wise [place] Medical Record #[id number] Date of Birth: [birth year]  Referring: Jettie Booze, MD Primary Care: Josem Kaufmann, MD Primary Cardiologist: Larae Grooms, MD   Chief Complaint:   POST OP FOLLOW UP 04/27/2021   Surgeon:  Gaye Pollack, MD   First Assistant: Ashley Murrain,  PA-C     Preoperative Diagnosis:  Severe multi-vessel coronary artery disease     Postoperative Diagnosis:  Same     Procedure:   Median Sternotomy Extracorporeal circulation 3.   Coronary artery bypass grafting x 3   Left internal mammary artery graft to the LAD SVG to diagonal SVG to PDA   4.   Endoscopic vein harvest from the right leg   5.   Aortic valve replacement using a 25 mm Edwards INSPIRIS RESILIA pericardial valve     Anesthesia:  General Endotracheal History of Present Illness: Patient is a 75 year old male status post the above described procedure seen in the office on today's date and routine postsurgical follow-up.  He reports that he is walking up to several miles a day.  He denies chest pain, shortness of breath or palpitations.  He is not having any lower extremity edema.  He has had no difficulties with his incisions.  He denies fevers, chills or other significant constitutional symptoms.  Overall he is quite pleased with his progress.      Past Medical History:  Diagnosis Date   Anxiety    Aortic stenosis    Arthritis    Complication of anesthesia    bladder spasms after neck surgery. sent home with a catheter   Coronary artery disease    Depression    Hemorrhoids    Hiatal hernia    High cholesterol    History of kidney stones    Hx of adenomatous polyp of colon 01/31/2017   Numbness and tingling in left hand    Stroke ([place]) 11/09/2013   Tia    TIA (transient ischemic attack)    Urinary frequency      Social History    Tobacco Use  Smoking Status Never  Smokeless Tobacco Never    Social History   Substance and Sexual Activity  Alcohol Use Not Currently     No Known Allergies  Current Outpatient Medications  Medication Sig Dispense Refill   acetaminophen (TYLENOL) 500 MG tablet Take 1,000 mg by mouth in the morning and at bedtime.     allopurinol (ZYLOPRIM) 300 MG tablet Take 300 mg by mouth in the morning.     ALPRAZolam (XANAX) 0.5 MG tablet Take 0.25 mg by mouth 2 (two) times daily as needed for anxiety.     aspirin EC 325 MG EC tablet Take 1 tablet (325 mg total) by mouth daily. 30 tablet 0   atorvastatin (LIPITOR) 40 MG tablet Take 40 mg by mouth every evening.      cholecalciferol (VITAMIN D3) 25 MCG (1000 UNIT) tablet Take 1,000 Units by mouth daily.     ferrous sulfate (FERROUSUL) 325 (65 FE) MG tablet Take 1 tablet (325 mg total) by mouth daily with breakfast. 90 tablet 3   finasteride (PROSCAR) 5 MG tablet Take 5 mg by mouth daily.     fluorouracil (EFUDEX) 5 % cream Apply 1 application topically daily as needed (  sun damage).     metoprolol tartrate (LOPRESSOR) 25 MG tablet Take 1 tablet (25 mg total) by mouth 2 (two) times daily. 180 tablet 3   Polyethyl Glycol-Propyl Glycol (SYSTANE OP) Place 1 drop into both eyes in the morning and at bedtime.     potassium citrate (UROCIT-K) 10 MEQ (1080 MG) SR tablet Take 10 mEq by mouth 2 (two) times daily.     Saline (SIMPLY SALINE) 0.9 % AERS Place 1 spray into the nose in the morning and at bedtime.     sertraline (ZOLOFT) 100 MG tablet Take 100 mg by mouth at bedtime.     tamsulosin (FLOMAX) 0.4 MG CAPS capsule Take 0.4 mg by mouth 2 (two) times daily.     triamcinolone (NASACORT) 55 MCG/ACT AERO nasal inhaler Place 1 spray into the nose in the morning.     No current facility-administered medications for this visit.       Physical Exam: BP 134/77   Pulse 86   Resp 20   Ht 5\' 9"  (1.753 m)   Wt 166 lb (75.3 kg)   SpO2 95% Comment:  RA  BMI 24.51 kg/m   General appearance: alert, cooperative, and no distress Heart: regular rate and rhythm, S1, S2 normal, no murmur, click, rub or gallop Lungs: clear to auscultation bilaterally Extremities: No edema Wound: Incisions healing well without signs of infection.   Diagnostic Studies & Laboratory data:     Recent Radiology Findings:   DG Chest 2 View  Result Date: 05/30/2021 CLINICAL DATA:  History of aortic valve replacement on 04/27/2021. Follow-up exam. EXAM: CHEST - 2 VIEW COMPARISON:  05/02/2021 and older exams. FINDINGS: Changes from cardiac surgery and aortic valve replacement are noted. Cardiac silhouette is normal in size and configuration. Normal mediastinal and hilar contours. Small right pleural effusion with minor associated dependent lung base subsegmental atelectasis. Lungs otherwise clear. No left pleural effusion. No pneumothorax. Skeletal structures are intact. IMPRESSION: 1. No acute findings. Small residual right pleural effusion. Right pneumothorax noted previously has resolved. Electronically Signed   By: Lajean Manes M.D.   On: 05/30/2021 13:58      Recent Lab Findings: Lab Results  Component Value Date   WBC 6.1 05/21/2021   HGB 11.2 (L) 05/21/2021   HCT 34.7 (L) 05/21/2021   PLT 357 05/21/2021   GLUCOSE 90 05/21/2021   CHOL 86 04/29/2021   TRIG 107 04/29/2021   HDL 21 (L) 04/29/2021   LDLCALC 44 04/29/2021   ALT 19 04/25/2021   AST 28 04/25/2021   NA 138 05/21/2021   K 5.2 05/21/2021   CL 99 05/21/2021   CREATININE 0.66 (L) 05/21/2021   BUN 18 05/21/2021   CO2 27 05/21/2021   INR 1.7 (H) 04/27/2021   HGBA1C 5.7 (H) 04/25/2021      Assessment / Plan: Patient is doing quite well.  I reviewed his chest x-ray and there are no concerning findings.  The previous pneumothorax has resolved.  Clinically, he continues to do quite well.  We discussed activity progression including driving.  I did not make any changes to his current medication  regimen.  We will see the patient again on a as needed basis for any surgically related needs or at request.  He continues to follow-up with cardiology on a regularly scheduled program.  He is scheduled for echocardiogram in the near future.      Medication Changes: No orders of the defined types were placed in this encounter.  John Giovanni, PA-C  05/30/2021 2:06 PM

## 2021-06-11 ENCOUNTER — Other Ambulatory Visit: Payer: Self-pay

## 2021-06-11 ENCOUNTER — Ambulatory Visit (HOSPITAL_COMMUNITY): Payer: Medicare Other | Attending: Cardiology

## 2021-06-11 DIAGNOSIS — Z952 Presence of prosthetic heart valve: Secondary | ICD-10-CM | POA: Diagnosis not present

## 2021-06-11 DIAGNOSIS — I350 Nonrheumatic aortic (valve) stenosis: Secondary | ICD-10-CM | POA: Diagnosis present

## 2021-06-11 DIAGNOSIS — Z953 Presence of xenogenic heart valve: Secondary | ICD-10-CM | POA: Diagnosis present

## 2021-06-11 LAB — ECHOCARDIOGRAM COMPLETE
AR max vel: 1.38 cm2
AV Area VTI: 1.49 cm2
AV Area mean vel: 1.45 cm2
AV Mean grad: 7 mmHg
AV Peak grad: 14 mmHg
Ao pk vel: 1.87 m/s
Area-P 1/2: 3.53 cm2
S' Lateral: 2.3 cm

## 2021-06-12 ENCOUNTER — Telehealth: Payer: Self-pay | Admitting: Interventional Cardiology

## 2021-06-12 NOTE — Telephone Encounter (Signed)
Patient returning call for echo results. 

## 2021-06-12 NOTE — Telephone Encounter (Signed)
The patient has been notified of the result and verbalized understanding.  All questions (if any) were answered. Nuala Alpha, LPN [id number] 4:47 PM

## 2021-06-12 NOTE — Telephone Encounter (Signed)
-----   Message from Jettie Booze, MD sent at 06/12/2021  7:57 AM EST ----- Normal LV function and normal prosthetic aortic valve function.

## 2021-07-11 ENCOUNTER — Other Ambulatory Visit (HOSPITAL_COMMUNITY): Payer: Medicare Other

## 2021-07-11 ENCOUNTER — Ambulatory Visit: Payer: Medicare Other | Admitting: Interventional Cardiology

## 2021-08-29 NOTE — Progress Notes (Signed)
Cardiology Office Note   Date:  08/31/2021   ID:  Nicholas Wise, DOB 1946/01/06, MRN [id number]  PCP:  Josem Kaufmann, MD    No chief complaint on file.  CAD  Wt Readings from Last 3 Encounters:  08/31/21 165 lb 12.8 oz (75.2 kg)  05/30/21 166 lb (75.3 kg)  05/21/21 165 lb 3.2 oz (74.9 kg)       History of Present Illness: Nicholas Wise is a 75 y.o. male  with:  Aortic stenosis Coronary artery disease S/p CABG + bioprosthetic AVR 04/2021 Carotid artery disease [place] 10/22: R ICA 100; L ICA 60-79 History of TIA in 2015 History of multiple back surgeries S/p R TKR in 05/2019 Hyperlipidemia Dilated ascending aorta Echo 6/22: 40 mm  Records show: He had "worsening aortic stenosis now in the severe range.  He was referred for cardiac catheterization which demonstrated three-vessel CAD in 2022.  He was referred to cardiothoracic surgery and underwent CABG x3 (LIMA-LAD, SVG-Dx, SVG-PDA) and bioprosthetic aortic valve replacement with Dr. Cyndia Bent.  He was admitted 10/14-10/20.  His postoperative course was notable for orthostatic hypotension, right and left pneumothoraces, anemia requiring transfusion and thrombocytopenia.  He was briefly placed on Midodrine to control his low blood pressures.  Chest tube placement was attempted on the right but this was unsuccessful.  His pneumothoraces were followed conservatively with serial chest x-rays.  His platelet count eventually improved.  "  Now off midodrine.  Wondering about iron.  Walking and working in the yard.    Denies : Chest pain. Dizziness. Leg edema. Nitroglycerin use. Orthopnea. Palpitations. Paroxysmal nocturnal dyspnea. Shortness of breath. Syncope.      Past Medical History:  Diagnosis Date   Anxiety    Aortic stenosis    Arthritis    Complication of anesthesia    bladder spasms after neck surgery. sent home with a catheter   Coronary artery disease    Depression    Hemorrhoids    Hiatal hernia    High  cholesterol    History of kidney stones    Hx of adenomatous polyp of colon 01/31/2017   Numbness and tingling in left hand    Stroke ([place]) 11/09/2013   Tia    TIA (transient ischemic attack)    Urinary frequency     Past Surgical History:  Procedure Laterality Date   ANTERIOR CERVICAL DECOMP/DISCECTOMY FUSION N/A 04/11/2014   Procedure: ANTERIOR CERVICAL DECOMPRESSION/DISCECTOMY FUSION CERVICAL THREE-FOUR,CERVICAL FOUR-FIVE,CERVICAL FIVE-SIX;  Surgeon: Erline Levine, MD;  Location: [place] [facility];  Service: Neurosurgery;  Laterality: N/A;   ANTERIOR LAT LUMBAR FUSION Left 02/20/2018   Procedure: Left Lumbar Four-Five Anterolateral lumbar interbody fusion;  Surgeon: Erline Levine, MD;  Location: [place];  Service: Neurosurgery;  Laterality: Left;   AORTIC VALVE REPLACEMENT N/A 04/27/2021   Procedure: AORTIC VALVE REPLACEMENT (AVR) USING INSPIRIS 25MM AORTIC VALVE;  Surgeon: Gaye Pollack, MD;  Location: [place] [facility];  Service: Open Heart Surgery;  Laterality: N/A;   BACK SURGERY     COLONOSCOPY     CORONARY ARTERY BYPASS GRAFT N/A 04/27/2021   Procedure: CORONARY ARTERY BYPASS GRAFTING (CABG) X  3 , USING LEFT INTERNAL MAMMARY ARTERY AND RIGHT ENDOSCOPIC GREATER SAPHENOUS VEIN CONDUITS;  Surgeon: Gaye Pollack, MD;  Location: [facility];  Service: Open Heart Surgery;  Laterality: N/A;   ENDOVEIN HARVEST OF GREATER SAPHENOUS VEIN Right 04/27/2021   Procedure: ENDOVEIN HARVEST OF GREATER SAPHENOUS VEIN;  Surgeon: Gaye Pollack, MD;  Location: [facility];  Service: Open Heart Surgery;  Laterality: Right;   KNEE SURGERY Right    LUMBAR PERCUTANEOUS PEDICLE SCREW 1 LEVEL N/A 02/20/2018   Procedure: Lumbar Percutaneous Pedicle Screw Placment Lumbar four-five;  Surgeon: Erline Levine, MD;  Location: [place];  Service: Neurosurgery;  Laterality: N/A;   RIGHT HEART CATH AND CORONARY ANGIOGRAPHY N/A 03/27/2021   Procedure: RIGHT HEART CATH AND CORONARY ANGIOGRAPHY;  Surgeon: Jettie Booze, MD;  Location: [facility];  Service: Cardiovascular;  Laterality: N/A;   TEE WITHOUT CARDIOVERSION N/A 04/27/2021   Procedure: TRANSESOPHAGEAL ECHOCARDIOGRAM (TEE);  Surgeon: Gaye Pollack, MD;  Location: [place];  Service: Open Heart Surgery;  Laterality: N/A;   TONSILLECTOMY     TOTAL KNEE ARTHROPLASTY Right 06/08/2019   Procedure: RIGHT TOTAL KNEE ARTHROPLASTY;  Surgeon: Melrose Nakayama, MD;  Location: [facility];  Service: Orthopedics;  Laterality: Right;   XI ROBOTIC ASSISTED INGUINAL HERNIA REPAIR WITH MESH Bilateral 11/21/2020   Procedure: ROBOTIC BILATERAL INGUINAL HERNIA REPAIR WITH MESH;  Surgeon: Ralene Ok, MD;  Location: [place];  Service: General;  Laterality: Bilateral;     Current Outpatient Medications  Medication Sig Dispense Refill   acetaminophen (TYLENOL) 500 MG tablet Take 1,000 mg by mouth in the morning and at bedtime.     allopurinol (ZYLOPRIM) 300 MG tablet Take 300 mg by mouth in the morning.     ALPRAZolam (XANAX) 0.5 MG tablet Take 0.25 mg by mouth 2 (two) times daily as needed for anxiety.     aspirin EC 325 MG EC tablet Take 1 tablet (325 mg total) by mouth daily. 30 tablet 0   atorvastatin (LIPITOR) 40 MG tablet Take 40 mg by mouth every evening.      cholecalciferol (VITAMIN D3) 25 MCG (1000 UNIT) tablet Take 1,000 Units by mouth daily.     ferrous sulfate (FERROUSUL) 325 (65 FE) MG tablet Take 1 tablet (325 mg total) by mouth daily with breakfast. 90 tablet 3   finasteride (PROSCAR) 5 MG tablet Take 5 mg by mouth daily.     fluorouracil (EFUDEX) 5 % cream Apply 1 application topically daily as needed (sun damage).     metoprolol tartrate (LOPRESSOR) 25 MG tablet Take 1 tablet (25 mg total) by mouth 2 (two) times daily. 180 tablet 3   montelukast (SINGULAIR) 10 MG tablet Take 10 mg by mouth daily.     Polyethyl Glycol-Propyl Glycol (SYSTANE OP) Place 1 drop into both eyes in the morning and at bedtime.     potassium citrate (UROCIT-K) 10 MEQ (1080 MG) SR tablet Take 10  mEq by mouth 2 (two) times daily.     Saline (SIMPLY SALINE) 0.9 % AERS Place 1 spray into the nose in the morning and at bedtime.     sertraline (ZOLOFT) 100 MG tablet Take 100 mg by mouth at bedtime.     tamsulosin (FLOMAX) 0.4 MG CAPS capsule Take 0.4 mg by mouth 2 (two) times daily.     triamcinolone (NASACORT) 55 MCG/ACT AERO nasal inhaler Place 1 spray into the nose in the morning.     No current facility-administered medications for this visit.    Allergies:   Bee pollen    Social History:  The patient  reports that he has never smoked. He has never used smokeless tobacco. He reports that he does not currently use alcohol. He reports that he does not use drugs.   Family History:  The patient's family history includes Diabetes  in his father; Heart disease in his father.    ROS:  Please see the history of present illness.   Otherwise, review of systems are positive for feels stamina is improving.   All other systems are reviewed and negative.    PHYSICAL EXAM: VS:  BP 112/68    Pulse (!) 58    Ht 5\' 9"  (1.753 m)    Wt 165 lb 12.8 oz (75.2 kg)    SpO2 95%    BMI 24.48 kg/m  , BMI Body mass index is 24.48 kg/m. GEN: Well nourished, well developed, in no acute distress HEENT: normal Neck: no JVD, carotid bruits, or masses Cardiac: RRR; no murmurs, rubs, or gallops,no edema  Respiratory:  clear to auscultation bilaterally, normal work of breathing GI: soft, nontender, nondistended, + BS MS: no deformity or atrophy Skin: warm and dry, no rash Neuro:  Strength and sensation are intact Psych: euthymic mood, full affect    Recent Labs: 04/25/2021: ALT 19 04/28/2021: Magnesium 2.0 05/21/2021: BUN 18; Creatinine, Ser 0.66; Hemoglobin 11.2; Platelets 357; Potassium 5.2; Sodium 138   Lipid Panel    Component Value Date/Time   CHOL 86 04/29/2021 0445   TRIG 107 04/29/2021 0445   HDL 21 (L) 04/29/2021 0445   CHOLHDL 4.1 04/29/2021 0445   VLDL 21 04/29/2021 0445   LDLCALC 44  04/29/2021 0445     Other studies Reviewed: Additional studies/ records that were reviewed today with results demonstrating: labs reviewed.   ASSESSMENT AND PLAN:  CAD: s/p CABG.  No angina on medical therapy.  Continue aggressive secondary prevention. Recovering well.  S/p AVR: needs SBE prophylaxis.   Hyperlipidemia:  LDL 44.  COntinue atorvastatin 40 mg daily.  PAD: Oclusion of right carotid.  60-79% left carotid; Duplex scheduled in May 2023.  Will need f/u with vascular and intervention if > 80%.  PACs: no recent sx.  He continues to take iron.  Will check CBC.  If hemoglobin has improved and he is normocytic, could consider stopping iron.   Current medicines are reviewed at length with the patient today.  The patient concerns regarding his medicines were addressed.  The following changes have been made:  No change  Labs/ tests ordered today include:  No orders of the defined types were placed in this encounter.   Recommend 150 minutes/week of aerobic exercise Low fat, low carb, high fiber diet recommended  Disposition:   FU in 6 months   Signed, Larae Grooms, MD  08/31/2021 11:32 AM    [facility] [address], [place], [place]  [address] Phone: [phone]; Fax: [phone]

## 2021-08-31 ENCOUNTER — Other Ambulatory Visit: Payer: Self-pay

## 2021-08-31 ENCOUNTER — Encounter: Payer: Self-pay | Admitting: Interventional Cardiology

## 2021-08-31 ENCOUNTER — Ambulatory Visit (INDEPENDENT_AMBULATORY_CARE_PROVIDER_SITE_OTHER): Payer: Medicare Other | Admitting: Interventional Cardiology

## 2021-08-31 VITALS — BP 112/68 | HR 58 | Ht 69.0 in | Wt 165.8 lb

## 2021-08-31 DIAGNOSIS — Z953 Presence of xenogenic heart valve: Secondary | ICD-10-CM | POA: Diagnosis not present

## 2021-08-31 DIAGNOSIS — I2510 Atherosclerotic heart disease of native coronary artery without angina pectoris: Secondary | ICD-10-CM | POA: Diagnosis not present

## 2021-08-31 DIAGNOSIS — I6521 Occlusion and stenosis of right carotid artery: Secondary | ICD-10-CM | POA: Diagnosis not present

## 2021-08-31 DIAGNOSIS — I491 Atrial premature depolarization: Secondary | ICD-10-CM

## 2021-08-31 DIAGNOSIS — E782 Mixed hyperlipidemia: Secondary | ICD-10-CM

## 2021-08-31 DIAGNOSIS — Z951 Presence of aortocoronary bypass graft: Secondary | ICD-10-CM

## 2021-08-31 NOTE — Patient Instructions (Signed)
Medication Instructions:  °Your physician recommends that you continue on your current medications as directed. Please refer to the Current Medication list given to you today. ° °*If you need a refill on your cardiac medications before your next appointment, please call your pharmacy* ° ° °Lab Work: °none °If you have labs (blood work) drawn today and your tests are completely normal, you will receive your results only by: °MyChart Message (if you have MyChart) OR °A paper copy in the mail °If you have any lab test that is abnormal or we need to change your treatment, we will call you to review the results. ° ° °Testing/Procedures: °none ° ° °Follow-Up: °At [facility], you and your health needs are our priority.  As part of our continuing mission to provide you with exceptional heart care, we have created designated Provider Care Teams.  These Care Teams include your primary Cardiologist (physician) and Advanced Practice Providers (APPs -  Physician Assistants and Nurse Practitioners) who all work together to provide you with the care you need, when you need it. ° °We recommend signing up for the patient portal called "MyChart".  Sign up information is provided on this After Visit Summary.  MyChart is used to connect with patients for Virtual Visits (Telemedicine).  Patients are able to view lab/test results, encounter notes, upcoming appointments, etc.  Non-urgent messages can be sent to your provider as well.   °To learn more about what you can do with MyChart, go to [email or web address].   ° °Your next appointment:   °6 month(s) ° °The format for your next appointment:   °In Person ° °Provider:   °Jayadeep Varanasi, MD   ° ° °Other Instructions ° ° °

## 2021-11-22 ENCOUNTER — Other Ambulatory Visit: Payer: Self-pay | Admitting: Physician Assistant

## 2021-11-22 DIAGNOSIS — I77810 Thoracic aortic ectasia: Secondary | ICD-10-CM

## 2021-11-22 DIAGNOSIS — D500 Iron deficiency anemia secondary to blood loss (chronic): Secondary | ICD-10-CM

## 2021-11-22 DIAGNOSIS — I6521 Occlusion and stenosis of right carotid artery: Secondary | ICD-10-CM

## 2021-11-28 ENCOUNTER — Ambulatory Visit (HOSPITAL_COMMUNITY)
Admission: RE | Admit: 2021-11-28 | Discharge: 2021-11-28 | Disposition: A | Discharge status: Home / Self Care | Payer: Medicare Other | Source: Ambulatory Visit | Attending: Cardiology | Admitting: Cardiology

## 2021-11-28 DIAGNOSIS — D500 Iron deficiency anemia secondary to blood loss (chronic): Secondary | ICD-10-CM | POA: Diagnosis not present

## 2021-11-28 DIAGNOSIS — I6521 Occlusion and stenosis of right carotid artery: Secondary | ICD-10-CM | POA: Insufficient documentation

## 2021-11-28 DIAGNOSIS — I77810 Thoracic aortic ectasia: Secondary | ICD-10-CM | POA: Diagnosis not present

## 2021-11-29 ENCOUNTER — Telehealth: Payer: Self-pay | Admitting: Interventional Cardiology

## 2021-11-29 NOTE — Telephone Encounter (Signed)
Pt returning call regarding test results. Please advise ?

## 2021-11-29 NOTE — Telephone Encounter (Signed)
Spoke with patient to discuss results of Carotid US.  Per Richardson Dopp, PA-C: Stable carotid artery disease.  R ICA has been chronically occluded.  There is moderate stenosis on the L.   PLAN:  -Continue current medications/treatment plan and follow up as scheduled.  -Repeat 1 year -Send copy to PCP Richardson Dopp, PA-C     Patient verbalized understanding.

## 2021-12-19 ENCOUNTER — Other Ambulatory Visit: Payer: Self-pay | Admitting: Student

## 2021-12-19 ENCOUNTER — Encounter: Payer: Self-pay | Admitting: Internal Medicine

## 2021-12-19 DIAGNOSIS — M5416 Radiculopathy, lumbar region: Secondary | ICD-10-CM

## 2021-12-27 ENCOUNTER — Ambulatory Visit
Admission: RE | Admit: 2021-12-27 | Discharge: 2021-12-27 | Disposition: A | Discharge status: Home / Self Care | Payer: Medicare Other | Source: Ambulatory Visit | Attending: Student | Admitting: Student

## 2021-12-27 DIAGNOSIS — M5416 Radiculopathy, lumbar region: Secondary | ICD-10-CM

## 2021-12-27 MED ORDER — IOPAMIDOL (ISOVUE-M 200) INJECTION 41%
1.0000 mL | Freq: Once | INTRAMUSCULAR | Status: AC
  Administered 2021-12-27: 1 mL via EPIDURAL

## 2021-12-27 MED ORDER — METHYLPREDNISOLONE ACETATE 40 MG/ML INJ SUSP (RADIOLOG
80.0000 mg | Freq: Once | INTRAMUSCULAR | Status: AC
  Administered 2021-12-27: 80 mg via EPIDURAL

## 2021-12-27 NOTE — Discharge Instructions (Signed)
Post Procedure Spinal Discharge Instruction Sheet  You may resume a regular diet and any medications that you routinely take (including pain medications) unless otherwise noted by MD.  No driving day of procedure.  Light activity throughout the rest of the day.  Do not do any strenuous work, exercise, bending or lifting.  The day following the procedure, you can resume normal physical activity but you should refrain from exercising or physical therapy for at least three days thereafter.  You may apply ice to the injection site, 20 minutes on, 20 minutes off, as needed. Do not apply ice directly to skin.    Common Side Effects:  Headaches- take your usual medications as directed by your physician.  Increase your fluid intake.  Caffeinated beverages may be helpful.  Lie flat in bed until your headache resolves.  Restlessness or inability to sleep- you may have trouble sleeping for the next few days.  Ask your referring physician if you need any medication for sleep.  Facial flushing or redness- should subside within a few days.  Increased pain- a temporary increase in pain a day or two following your procedure is not unusual.  Take your pain medication as prescribed by your referring physician.  Leg cramps  Please contact our office at [phone] for the following symptoms: Fever greater than 100 degrees. Headaches unresolved with medication after 2-3 days. Increased swelling, pain, or redness at injection site.   Thank you for visiting [facility] today.     You may resume taking Aspirin today.

## 2021-12-28 ENCOUNTER — Telehealth: Payer: Self-pay | Admitting: *Deleted

## 2021-12-28 NOTE — Telephone Encounter (Signed)
I do not see any contraindication to [facility]

## 2021-12-28 NOTE — Telephone Encounter (Signed)
Nicholas Wise,  This pt is cleared for anesthetic care at [facility].    Thanks,  Osvaldo Angst

## 2021-12-28 NOTE — Telephone Encounter (Signed)
Dr Larina Bras- please review chart and advise if ok for [facility]? S/P CABG and AVR in 2022, does have occluded carotid on one side with stenosis in other which is being monitored. Confirmed with pt he has been off Plavix since Nov. 2022.  Thank you,  Lattie Haw PV

## 2022-01-14 ENCOUNTER — Ambulatory Visit (AMBULATORY_SURGERY_CENTER): Payer: Self-pay | Admitting: *Deleted

## 2022-01-14 VITALS — Ht 69.0 in | Wt 164.0 lb

## 2022-01-14 DIAGNOSIS — Z86010 Personal history of colonic polyps: Secondary | ICD-10-CM

## 2022-01-14 NOTE — Progress Notes (Signed)
No egg or soy allergy known to patient  No issues known to pt with past sedation with any surgeries or procedures Patient denies ever being told they had issues or difficulty with intubation  No FH of Malignant Hyperthermia Pt is not on diet pills Pt is not on  home 02  Pt is not on blood thinners  Pt denies issues with constipation  No A fib or A flutter Have any cardiac testing pending--PT DENIES

## 2022-01-23 DIAGNOSIS — I10 Essential (primary) hypertension: Secondary | ICD-10-CM | POA: Insufficient documentation

## 2022-02-03 ENCOUNTER — Encounter: Payer: Self-pay | Admitting: Certified Registered Nurse Anesthetist

## 2022-02-11 ENCOUNTER — Encounter: Payer: Self-pay | Admitting: Internal Medicine

## 2022-02-11 ENCOUNTER — Ambulatory Visit (AMBULATORY_SURGERY_CENTER): Payer: Medicare Other | Admitting: Internal Medicine

## 2022-02-11 VITALS — BP 118/64 | HR 72 | Temp 97.7°F | Resp 19 | Ht 69.0 in | Wt 164.0 lb

## 2022-02-11 DIAGNOSIS — Z09 Encounter for follow-up examination after completed treatment for conditions other than malignant neoplasm: Secondary | ICD-10-CM | POA: Diagnosis not present

## 2022-02-11 DIAGNOSIS — Z86010 Personal history of colonic polyps: Secondary | ICD-10-CM

## 2022-02-11 MED ORDER — SODIUM CHLORIDE 0.9 % IV SOLN: 500.0000 mL | Freq: Once | INTRAVENOUS | Status: DC

## 2022-02-11 NOTE — Patient Instructions (Addendum)
No polyps today.  No further routine colonoscopy testing is needed at your age and with no polyps.  I appreciate the opportunity to care for you. Gatha Mayer, MD, [facility]   HANDOUT ON DIVERTICULOSIS GIVEN.    YOU HAD AN ENDOSCOPIC PROCEDURE TODAY AT [facility]:   Refer to the procedure report that was given to you for any specific questions about what was found during the examination.  If the procedure report does not answer your questions, please call your gastroenterologist to clarify.  If you requested that your care partner not be given the details of your procedure findings, then the procedure report has been included in a sealed envelope for you to review at your convenience later.  YOU SHOULD EXPECT: Some feelings of bloating in the abdomen. Passage of more gas than usual.  Walking can help get rid of the air that was put into your GI tract during the procedure and reduce the bloating. If you had a lower endoscopy (such as a colonoscopy or flexible sigmoidoscopy) you may notice spotting of blood in your stool or on the toilet paper. If you underwent a bowel prep for your procedure, you may not have a normal bowel movement for a few days.  Please Note:  You might notice some irritation and congestion in your nose or some drainage.  This is from the oxygen used during your procedure.  There is no need for concern and it should clear up in a day or so.  SYMPTOMS TO REPORT IMMEDIATELY:  Following lower endoscopy (colonoscopy or flexible sigmoidoscopy):  Excessive amounts of blood in the stool  Significant tenderness or worsening of abdominal pains  Swelling of the abdomen that is new, acute  Fever of 100F or higher    For urgent or emergent issues, a gastroenterologist can be reached at any hour by calling [phone]. Do not use MyChart messaging for urgent concerns.    DIET:  We do recommend a small meal at first, but then you may proceed to your regular  diet.  Drink plenty of fluids but you should avoid alcoholic beverages for 24 hours.  ACTIVITY:  You should plan to take it easy for the rest of today and you should NOT DRIVE or use heavy machinery until tomorrow (because of the sedation medicines used during the test).    FOLLOW UP: Our staff will call the number listed on your records the next business day following your procedure.  We will call around 7:15- 8:00 am to check on you and address any questions or concerns that you may have regarding the information given to you following your procedure. If we do not reach you, we will leave a message.  If you develop any symptoms (ie: fever, flu-like symptoms, shortness of breath, cough etc.) before then, please call [phone].  If you test positive for Covid 19 in the 2 weeks post procedure, please call and report this information to [place].    If any biopsies were taken you will be contacted by phone or by letter within the next 1-3 weeks.  Please call us at [phone] if you have not heard about the biopsies in 3 weeks.    SIGNATURES/CONFIDENTIALITY: You and/or your care partner have signed paperwork which will be entered into your electronic medical record.  These signatures attest to the fact that that the information above on your After Visit Summary has been reviewed and is understood.  Full responsibility of the confidentiality of  this discharge information lies with you and/or your care-partner.

## 2022-02-11 NOTE — Progress Notes (Signed)
[facility] History and Physical   Primary Care Physician:  Josem Kaufmann, MD   Reason for Procedure:   Hx colon adenoma  Plan:    colonoscopy     HPI: Nicholas Wise is a 76 y.o. male here w/ hx colon polyps diminutive adenoma removed 2018   Past Medical History:  Diagnosis Date   Allergy    Anxiety    Aortic stenosis    Arthritis    Blood transfusion without reported diagnosis    WITH cabg X 2 57/8469   Complication of anesthesia    bladder spasms after neck surgery. sent home with a catheter   Coronary artery disease    Depression    Heart murmur    Hemorrhoids    Hiatal hernia    High cholesterol    History of kidney stones    Hx of adenomatous polyp of colon 01/31/2017   Numbness and tingling in left hand    Stroke ([place]) 11/09/2013   Tia    TIA (transient ischemic attack)    Urinary frequency     Past Surgical History:  Procedure Laterality Date   ANTERIOR CERVICAL DECOMP/DISCECTOMY FUSION N/A 04/11/2014   Procedure: ANTERIOR CERVICAL DECOMPRESSION/DISCECTOMY FUSION CERVICAL THREE-FOUR,CERVICAL FOUR-FIVE,CERVICAL FIVE-SIX;  Surgeon: Erline Levine, MD;  Location: [facility];  Service: Neurosurgery;  Laterality: N/A;   ANTERIOR LAT LUMBAR FUSION Left 02/20/2018   Procedure: Left Lumbar Four-Five Anterolateral lumbar interbody fusion;  Surgeon: Erline Levine, MD;  Location: [facility];  Service: Neurosurgery;  Laterality: Left;   AORTIC VALVE REPLACEMENT N/A 04/27/2021   Procedure: AORTIC VALVE REPLACEMENT (AVR) USING INSPIRIS 25MM AORTIC VALVE;  Surgeon: Gaye Pollack, MD;  Location: [facility];  Service: Open Heart Surgery;  Laterality: N/A;   BACK SURGERY     COLONOSCOPY     CORONARY ARTERY BYPASS GRAFT N/A 04/27/2021   Procedure: CORONARY ARTERY BYPASS GRAFTING (CABG) X  3 , USING LEFT INTERNAL MAMMARY ARTERY AND RIGHT ENDOSCOPIC GREATER SAPHENOUS VEIN CONDUITS;  Surgeon: Gaye Pollack, MD;  Location: [facility];  Service: Open Heart Surgery;  Laterality:  N/A;   ENDOVEIN HARVEST OF GREATER SAPHENOUS VEIN Right 04/27/2021   Procedure: ENDOVEIN HARVEST OF GREATER SAPHENOUS VEIN;  Surgeon: Gaye Pollack, MD;  Location: [facility];  Service: Open Heart Surgery;  Laterality: Right;   KNEE SURGERY Right    LUMBAR PERCUTANEOUS PEDICLE SCREW 1 LEVEL N/A 02/20/2018   Procedure: Lumbar Percutaneous Pedicle Screw Placment Lumbar four-five;  Surgeon: Erline Levine, MD;  Location: [facility];  Service: Neurosurgery;  Laterality: N/A;   POLYPECTOMY     RIGHT HEART CATH AND CORONARY ANGIOGRAPHY N/A 03/27/2021   Procedure: RIGHT HEART CATH AND CORONARY ANGIOGRAPHY;  Surgeon: Jettie Booze, MD;  Location: [facility];  Service: Cardiovascular;  Laterality: N/A;   TEE WITHOUT CARDIOVERSION N/A 04/27/2021   Procedure: TRANSESOPHAGEAL ECHOCARDIOGRAM (TEE);  Surgeon: Gaye Pollack, MD;  Location: [facility];  Service: Open Heart Surgery;  Laterality: N/A;   TONSILLECTOMY     TOTAL KNEE ARTHROPLASTY Right 06/08/2019   Procedure: RIGHT TOTAL KNEE ARTHROPLASTY;  Surgeon: Melrose Nakayama, MD;  Location: [facility];  Service: Orthopedics;  Laterality: Right;   XI ROBOTIC ASSISTED INGUINAL HERNIA REPAIR WITH MESH Bilateral 11/21/2020   Procedure: ROBOTIC BILATERAL INGUINAL HERNIA REPAIR WITH MESH;  Surgeon: Ralene Ok, MD;  Location: [facility];  Service: General;  Laterality: Bilateral;    Prior to Admission medications   Medication Sig Start Date End Date  Taking? Authorizing Provider  acetaminophen (TYLENOL) 500 MG tablet Take 1,000 mg by mouth in the morning and at bedtime.   Yes [provider]  allopurinol (ZYLOPRIM) 300 MG tablet Take 300 mg by mouth in the morning.   Yes [provider]  ALPRAZolam Duanne Moron) 0.5 MG tablet Take 0.25 mg by mouth 2 (two) times daily as needed for anxiety.   Yes [provider]  aspirin EC 325 MG EC tablet Take 1 tablet (325 mg total) by mouth daily. 05/03/21  Yes Conte, Tessa N, PA-C  atorvastatin (LIPITOR)  40 MG tablet Take 40 mg by mouth every evening.    Yes [provider]  cholecalciferol (VITAMIN D3) 25 MCG (1000 UNIT) tablet Take 1,000 Units by mouth daily.   Yes [provider]  fexofenadine (ALLEGRA) 180 MG tablet Take 180 mg by mouth daily.   Yes [provider]  finasteride (PROSCAR) 5 MG tablet Take 5 mg by mouth daily.   Yes [provider]  metoprolol tartrate (LOPRESSOR) 25 MG tablet Take 1 tablet (25 mg total) by mouth 2 (two) times daily. 05/21/21 05/21/22 Yes Weaver, Scott T, PA-C  montelukast (SINGULAIR) 10 MG tablet Take 10 mg by mouth daily. 08/15/21  Yes [provider]  Polyethyl Glycol-Propyl Glycol (SYSTANE OP) Place 1 drop into both eyes in the morning and at bedtime.   Yes [provider]  potassium citrate (UROCIT-K) 10 MEQ (1080 MG) SR tablet Take 10 mEq by mouth 2 (two) times daily.   Yes [provider]  Saline (SIMPLY SALINE) 0.9 % AERS Place 1 spray into the nose in the morning and at bedtime.   Yes [provider]  sertraline (ZOLOFT) 100 MG tablet Take 100 mg by mouth at bedtime.   Yes [provider]  tamsulosin (FLOMAX) 0.4 MG CAPS capsule Take 0.4 mg by mouth 2 (two) times daily.   Yes [provider]  triamcinolone (NASACORT) 55 MCG/ACT AERO nasal inhaler Place 1 spray into the nose in the morning.   Yes [provider]  zinc gluconate 50 MG tablet Take 50 mg by mouth daily.   Yes [provider]  ferrous sulfate (FERROUSUL) 325 (65 FE) MG tablet Take 1 tablet (325 mg total) by mouth daily with breakfast. 05/21/21 05/21/22  Richardson Dopp T, PA-C  fluorouracil (EFUDEX) 5 % cream Apply 1 application topically daily as needed (sun damage). 11/16/20   [provider]    Current Outpatient Medications  Medication Sig Dispense Refill   acetaminophen (TYLENOL) 500 MG tablet Take 1,000 mg by mouth in the morning and at bedtime.     allopurinol (ZYLOPRIM) 300 MG  tablet Take 300 mg by mouth in the morning.     ALPRAZolam (XANAX) 0.5 MG tablet Take 0.25 mg by mouth 2 (two) times daily as needed for anxiety.     aspirin EC 325 MG EC tablet Take 1 tablet (325 mg total) by mouth daily. 30 tablet 0   atorvastatin (LIPITOR) 40 MG tablet Take 40 mg by mouth every evening.      cholecalciferol (VITAMIN D3) 25 MCG (1000 UNIT) tablet Take 1,000 Units by mouth daily.     fexofenadine (ALLEGRA) 180 MG tablet Take 180 mg by mouth daily.     finasteride (PROSCAR) 5 MG tablet Take 5 mg by mouth daily.     metoprolol tartrate (LOPRESSOR) 25 MG tablet Take 1 tablet (25 mg total) by mouth 2 (two) times daily. 180 tablet 3  montelukast (SINGULAIR) 10 MG tablet Take 10 mg by mouth daily.     Polyethyl Glycol-Propyl Glycol (SYSTANE OP) Place 1 drop into both eyes in the morning and at bedtime.     potassium citrate (UROCIT-K) 10 MEQ (1080 MG) SR tablet Take 10 mEq by mouth 2 (two) times daily.     Saline (SIMPLY SALINE) 0.9 % AERS Place 1 spray into the nose in the morning and at bedtime.     sertraline (ZOLOFT) 100 MG tablet Take 100 mg by mouth at bedtime.     tamsulosin (FLOMAX) 0.4 MG CAPS capsule Take 0.4 mg by mouth 2 (two) times daily.     triamcinolone (NASACORT) 55 MCG/ACT AERO nasal inhaler Place 1 spray into the nose in the morning.     zinc gluconate 50 MG tablet Take 50 mg by mouth daily.     ferrous sulfate (FERROUSUL) 325 (65 FE) MG tablet Take 1 tablet (325 mg total) by mouth daily with breakfast. 90 tablet 3   fluorouracil (EFUDEX) 5 % cream Apply 1 application topically daily as needed (sun damage).     Current Facility-Administered Medications  Medication Dose Route Frequency Provider Last Rate Last Admin   0.9 %  sodium chloride infusion  500 mL Intravenous Once Gatha Mayer, MD        Allergies as of 02/11/2022 - Review Complete 02/11/2022  Allergen Reaction Noted   Bee pollen Cough 08/31/2021    Family History  Problem Relation Age of Onset    Diabetes Father    Heart disease Father    Colon cancer Neg Hx    Colon polyps Neg Hx    Esophageal cancer Neg Hx    Rectal cancer Neg Hx    Stomach cancer Neg Hx     Social History   Socioeconomic History   Marital status: Married    Spouse name: Not on file   Number of children: 2   Years of education: Not on file   Highest education level: Not on file  Occupational History   Occupation: courier  Tobacco Use   Smoking status: Former    Types: Cigarettes   Smokeless tobacco: Never  Vaping Use   Vaping Use: Never used  Substance and Sexual Activity   Alcohol use: Not Currently   Drug use: No   Sexual activity: Not on file  Other Topics Concern   Not on file  Social History Narrative   Not on file   Social Determinants of Health   Financial Resource Strain: Not on file  Food Insecurity: Not on file  Transportation Needs: Not on file  Physical Activity: Not on file  Stress: Not on file  Social Connections: Not on file  Intimate Partner Violence: Not on file    Review of Systems:  All other review of systems negative except as mentioned in the HPI.  Physical Exam: Vital signs BP (!) 161/93   Pulse 84   Temp 97.7 F (36.5 C) (Temporal)   Ht '5\' 9"'$  (1.753 m)   Wt 164 lb (74.4 kg)   SpO2 96%   BMI 24.22 kg/m   General:   Alert,  Well-developed, well-nourished, pleasant and cooperative in NAD Lungs:  Clear throughout to auscultation.   Heart:  Regular rate and rhythm; no murmurs, clicks, rubs,  or gallops. Abdomen:  Soft, nontender and nondistended. Normal bowel sounds.   Neuro/Psych:  Alert and cooperative. Normal mood and affect. A and O x 3   '@Ulric Salzman'$  E.  Carlean Purl, MD, [facility] [phone] (pager) 02/11/2022 11:51 AM@

## 2022-02-11 NOTE — Progress Notes (Signed)
Report given to PACU, vss 

## 2022-02-11 NOTE — Progress Notes (Signed)
Pt's states no medical or surgical changes since previsit or office visit. 

## 2022-02-11 NOTE — Op Note (Signed)
[facility] Patient Name: Nicholas Wise Procedure Date: 02/11/2022 11:52 AM MRN: [id number] Endoscopist: Gatha Mayer , MD Age: 76 Referring MD:  Date of Birth: 08-04-1945 Gender: Male Account #: [id number] Procedure:                Colonoscopy Indications:              Surveillance: Personal history of adenomatous                            polyps on last colonoscopy 5 years ago, Last                            colonoscopy: 2018 Medicines:                Monitored Anesthesia Care Procedure:                Pre-Anesthesia Assessment:                           - Prior to the procedure, a History and Physical                            was performed, and patient medications and                            allergies were reviewed. The patient's tolerance of                            previous anesthesia was also reviewed. The risks                            and benefits of the procedure and the sedation                            options and risks were discussed with the patient.                            All questions were answered, and informed consent                            was obtained. Prior Anticoagulants: The patient has                            taken no previous anticoagulant or antiplatelet                            agents. ASA Grade Assessment: III - A patient with                            severe systemic disease. After reviewing the risks                            and benefits, the patient was deemed in  satisfactory condition to undergo the procedure.                           After obtaining informed consent, the colonoscope                            was passed under direct vision. Throughout the                            procedure, the patient's blood pressure, pulse, and                            oxygen saturations were monitored continuously. The                            CF [id number] #[id number] was introduced through the anus                             and advanced to the the cecum, identified by                            appendiceal orifice and ileocecal valve. The                            colonoscopy was somewhat difficult due to a                            tortuous colon. Successful completion of the                            procedure was aided by straightening and shortening                            the scope to obtain bowel loop reduction and                            applying abdominal pressure. The patient tolerated                            the procedure well. The quality of the bowel                            preparation was good. The ileocecal valve,                            appendiceal orifice, and rectum were photographed. Scope In: 12:01:26 PM Scope Out: 12:18:30 PM Scope Withdrawal Time: 0 hours 11 minutes 37 seconds  Total Procedure Duration: 0 hours 17 minutes 4 seconds  Findings:                 The perianal and digital rectal examinations were                            normal.  Multiple diverticula were found in the sigmoid                            colon and descending colon. There was narrowing of                            the colon in association with the diverticular                            opening.                           The exam was otherwise without abnormality on                            direct and retroflexion views. Complications:            No immediate complications. Estimated Blood Loss:     Estimated blood loss: none. Impression:               - Severe diverticulosis in the sigmoid colon and in                            the descending colon. There was narrowing of the                            colon in association with the diverticular opening.                           - The examination was otherwise normal on direct                            and retroflexion views.                           - No specimens collected.                            - Personal history of colonic polyp - diminutive                            adenoma 2018. Recommendation:           - Patient has a contact number available for                            emergencies. The signs and symptoms of potential                            delayed complications were discussed with the                            patient. Return to normal activities tomorrow.                            Written discharge instructions were provided to the  patient.                           - Resume previous diet.                           - Continue present medications.                           - No repeat colonoscopy due to age and the absence                            of colonic polyps. Gatha Mayer, MD 02/11/2022 12:25:18 PM This report has been signed electronically.

## 2022-02-12 ENCOUNTER — Telehealth: Payer: Self-pay

## 2022-02-12 NOTE — Telephone Encounter (Signed)
  Follow up Call-     02/11/2022   11:05 AM  Call back number  Post procedure Call Back phone  # [phone]  Permission to leave phone message Yes     Patient questions:  Do you have a fever, pain , or abdominal swelling? No. Pain Score  0 *  Have you tolerated food without any problems? Yes.    Have you been able to return to your normal activities? Yes.    Do you have any questions about your discharge instructions: Diet   No. Medications  No. Follow up visit  No.  Do you have questions or concerns about your Care? No.  Actions: * If pain score is 4 or above: No action needed, pain <4.

## 2022-02-13 ENCOUNTER — Other Ambulatory Visit: Payer: Self-pay | Admitting: Student

## 2022-02-13 DIAGNOSIS — M5416 Radiculopathy, lumbar region: Secondary | ICD-10-CM

## 2022-03-05 ENCOUNTER — Ambulatory Visit
Admission: RE | Admit: 2022-03-05 | Discharge: 2022-03-05 | Disposition: A | Discharge status: Home / Self Care | Payer: Medicare Other | Source: Ambulatory Visit | Attending: Student | Admitting: Student

## 2022-03-05 ENCOUNTER — Other Ambulatory Visit: Payer: Self-pay | Admitting: Neurological Surgery

## 2022-03-05 ENCOUNTER — Telehealth: Payer: Self-pay

## 2022-03-05 DIAGNOSIS — M5416 Radiculopathy, lumbar region: Secondary | ICD-10-CM

## 2022-03-05 MED ORDER — GADOBENATE DIMEGLUMINE 529 MG/ML IV SOLN
15.0000 mL | Freq: Once | INTRAVENOUS | Status: AC | PRN
  Administered 2022-03-05: 15 mL via INTRAVENOUS

## 2022-03-05 NOTE — Telephone Encounter (Signed)
   Pre-operative Risk Assessment    Patient Name: Nicholas Wise  DOB: 04/25/1946 MRN: [id number]     Request for Surgical Clearance    Procedure:  L1-2, L2-3 Lumbar Microdiskectomy   Date of Surgery:  Clearance 03/13/22                                 Surgeon:  Dr. Sherley Bounds  Surgeon's Group or Practice Name:  [facility]  Phone number:  [phone] Fax number:  [phone]   Type of Clearance Requested:   - Medical  - Pharmacy:  Hold Aspirin     Type of Anesthesia:  General     SignedMendel Ryder   03/05/2022, 4:47 PM

## 2022-03-06 ENCOUNTER — Telehealth: Payer: Self-pay | Admitting: *Deleted

## 2022-03-06 NOTE — Telephone Encounter (Signed)
Pt agreeable to plan of care tele pre op appt 03/08/22 @ 9:20. Pt advised to hold his ASA after today until surgeon office advises when it is safe to restart. Pt is needing to hold ASA 5-7 per the recommendations, pt advised to not take anymore ASA until after procedure. Med rec and consent are done.

## 2022-03-06 NOTE — Telephone Encounter (Signed)
Pt agreeable to plan of care tele pre op appt 03/08/22 @ 9:20. Pt advised to hold his ASA after today until surgeon office advises when it is safe to restart. Pt is needing to hold ASA 5-7 per the recommendations, pt advised to not take anymore ASA until after procedure. Med rec and consent are done.     Patient Consent for Virtual Visit        Larell Baney Reddy has provided verbal consent on 03/06/2022 for a virtual visit (video or telephone).   CONSENT FOR VIRTUAL VISIT FOR:  Okey Regal Brodbeck  By participating in this virtual visit I agree to the following:  I hereby voluntarily request, consent and authorize [facility] and its employed or contracted physicians, physician assistants, nurse practitioners or other licensed health care professionals (the Practitioner), to provide me with telemedicine health care services (the "Services") as deemed necessary by the treating Practitioner. I acknowledge and consent to receive the Services by the Practitioner via telemedicine. I understand that the telemedicine visit will involve communicating with the Practitioner through live audiovisual communication technology and the disclosure of certain medical information by electronic transmission. I acknowledge that I have been given the opportunity to request an in-person assessment or other available alternative prior to the telemedicine visit and am voluntarily participating in the telemedicine visit.  I understand that I have the right to withhold or withdraw my consent to the use of telemedicine in the course of my care at any time, without affecting my right to future care or treatment, and that the Practitioner or I may terminate the telemedicine visit at any time. I understand that I have the right to inspect all information obtained and/or recorded in the course of the telemedicine visit and may receive copies of available information for a reasonable fee.  I understand that some of the potential risks of  receiving the Services via telemedicine include:  Delay or interruption in medical evaluation due to technological equipment failure or disruption; Information transmitted may not be sufficient (e.g. poor resolution of images) to allow for appropriate medical decision making by the Practitioner; and/or  In rare instances, security protocols could fail, causing a breach of personal health information.  Furthermore, I acknowledge that it is my responsibility to provide information about my medical history, conditions and care that is complete and accurate to the best of my ability. I acknowledge that Practitioner's advice, recommendations, and/or decision may be based on factors not within their control, such as incomplete or inaccurate data provided by me or distortions of diagnostic images or specimens that may result from electronic transmissions. I understand that the practice of medicine is not an exact science and that Practitioner makes no warranties or guarantees regarding treatment outcomes. I acknowledge that a copy of this consent can be made available to me via my patient portal ([facility]), or I can request a printed copy by calling the office of [facility].    I understand that my insurance will be billed for this visit.   I have read or had this consent read to me. I understand the contents of this consent, which adequately explains the benefits and risks of the Services being provided via telemedicine.  I have been provided ample opportunity to ask questions regarding this consent and the Services and have had my questions answered to my satisfaction. I give my informed consent for the services to be provided through the use of telemedicine in my medical care

## 2022-03-06 NOTE — Telephone Encounter (Signed)
His aspirin may be held for 5 to 7 days prior to his procedure.  Preoperative team, please contact this patient and set up a phone call appointment for further cardiac evaluation.  Thank you for your help.  Jossie Ng. Kataleyah Carducci NP-C    03/06/2022, 12:45 PM [place] [address] Office [phone] Fax [phone]

## 2022-03-08 ENCOUNTER — Ambulatory Visit (INDEPENDENT_AMBULATORY_CARE_PROVIDER_SITE_OTHER): Payer: Medicare Other | Admitting: Physician Assistant

## 2022-03-08 DIAGNOSIS — Z01810 Encounter for preprocedural cardiovascular examination: Secondary | ICD-10-CM | POA: Diagnosis not present

## 2022-03-08 NOTE — Progress Notes (Signed)
Virtual Visit via Telephone Note   Because of Nicholas Wise's co-morbid illnesses, he is at least at moderate risk for complications without adequate follow up.  This format is felt to be most appropriate for this patient at this time.  The patient did not have access to video technology/had technical difficulties with video requiring transitioning to audio format only (telephone).  All issues noted in this document were discussed and addressed.  No physical exam could be performed with this format.  Please refer to the patient's chart for his consent to telehealth for [facility].  Evaluation Performed:  Preoperative cardiovascular risk assessment _____________   Date:  03/08/2022   Patient ID:  Nicholas Wise, DOB 1946-06-13, MRN [id number] Patient Location:  Home Provider location:   Office  Primary Care Provider:  Josem Kaufmann, MD Primary Cardiologist:  Larae Grooms, MD  Chief Complaint / Patient Profile   76 y.o. 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  Past Medical History    Past Medical History:  Diagnosis Date   Allergy    Anxiety    Aortic stenosis    Arthritis    Blood transfusion without reported diagnosis    WITH cabg X 2 42/3536   Complication of anesthesia    bladder spasms after neck surgery. sent home with a catheter   Coronary artery disease    Depression    Heart murmur    Hemorrhoids    Hiatal hernia    High cholesterol    History of kidney stones    Hx of adenomatous polyp of colon 01/31/2017   Numbness and tingling in left hand    Stroke ([place]) 11/09/2013   Tia    TIA  (transient ischemic attack)    Urinary frequency    Past Surgical History:  Procedure Laterality Date   ANTERIOR CERVICAL DECOMP/DISCECTOMY FUSION N/A 04/11/2014   Procedure: ANTERIOR CERVICAL DECOMPRESSION/DISCECTOMY FUSION CERVICAL THREE-FOUR,CERVICAL FOUR-FIVE,CERVICAL FIVE-SIX;  Surgeon: Erline Levine, MD;  Location: [place] [facility];  Service: Neurosurgery;  Laterality: N/A;   ANTERIOR LAT LUMBAR FUSION Left 02/20/2018   Procedure: Left Lumbar Four-Five Anterolateral lumbar interbody fusion;  Surgeon: Erline Levine, MD;  Location: [place];  Service: Neurosurgery;  Laterality: Left;   AORTIC VALVE REPLACEMENT N/A 04/27/2021   Procedure: AORTIC VALVE REPLACEMENT (AVR) USING INSPIRIS 25MM AORTIC VALVE;  Surgeon: Gaye Pollack, MD;  Location: [place] [facility];  Service: Open Heart Surgery;  Laterality: N/A;   BACK SURGERY     COLONOSCOPY     CORONARY ARTERY BYPASS GRAFT N/A 04/27/2021   Procedure: CORONARY ARTERY BYPASS GRAFTING (CABG) X  3 , USING LEFT INTERNAL MAMMARY ARTERY AND RIGHT ENDOSCOPIC GREATER SAPHENOUS VEIN CONDUITS;  Surgeon: Gaye Pollack, MD;  Location: [place];  Service: Open Heart Surgery;  Laterality: N/A;   ENDOVEIN HARVEST OF GREATER SAPHENOUS VEIN Right 04/27/2021   Procedure: ENDOVEIN HARVEST OF GREATER SAPHENOUS VEIN;  Surgeon: Gaye Pollack, MD;  Location: [place];  Service: Open Heart Surgery;  Laterality: Right;   KNEE SURGERY Right    LUMBAR PERCUTANEOUS PEDICLE SCREW 1 LEVEL N/A 02/20/2018   Procedure: Lumbar Percutaneous Pedicle Screw Placment Lumbar four-five;  Surgeon: Erline Levine, MD;  Location: [place];  Service: Neurosurgery;  Laterality: N/A;   POLYPECTOMY     RIGHT HEART CATH AND CORONARY ANGIOGRAPHY N/A 03/27/2021   Procedure: RIGHT HEART CATH AND CORONARY ANGIOGRAPHY;  Surgeon: Jettie Booze, MD;  Location: [place] CV LAB;  Service: Cardiovascular;  Laterality: N/A;   TEE WITHOUT CARDIOVERSION N/A 04/27/2021   Procedure: TRANSESOPHAGEAL ECHOCARDIOGRAM (TEE);   Surgeon: Gaye Pollack, MD;  Location: [place];  Service: Open Heart Surgery;  Laterality: N/A;   TONSILLECTOMY     TOTAL KNEE ARTHROPLASTY Right 06/08/2019   Procedure: RIGHT TOTAL KNEE ARTHROPLASTY;  Surgeon: Melrose Nakayama, MD;  [facility];  Service: Orthopedics;  Laterality: Right;   XI ROBOTIC ASSISTED INGUINAL HERNIA REPAIR WITH MESH Bilateral 11/21/2020   Procedure: ROBOTIC BILATERAL INGUINAL HERNIA REPAIR WITH MESH;  Surgeon: Ralene Ok, MD;  Location: [place];  Service: General;  Laterality: Bilateral;    Allergies  Allergies  Allergen Reactions   Bee Pollen Cough    History of Present Illness    Nicholas Wise is a 76 y.o. male who presents via audio/video conferencing for a telehealth visit today.  Pt was last seen in cardiology clinic on 08/31/2021 by Dr. Irish Lack.  At that time SHERYL TOWELL was doing well .  The patient is now pending procedure as outlined above. Since his last visit, he states that he started he knows he has been doing great.  He has been able to walk but his back is the main thing that holds him back.  He does feel fortunate that he is still able to do most things.  He had some blood work back in July which showed that his cholesterol was good.  He has always had slightly high triglycerides but he has been watching those closely.  He already stopped taking his aspirin this past Wednesday so that would be 7 days before his surgery since surgery is set for 8/30.  He also mentions that he stopped taking his metoprolol but blood pressure and heart rate have been well controlled.  From an activity standpoint, he is able to walk, do stairs, he trims bushes and does most of his outdoor and indoor housework.  Most recent carotid ultrasound 11/2021 showed chronic total occlusion of the right ICA, 40 to 59% stenosis of the left ICA.  Recommended annual ultrasounds for surveillance.  Most recent echocardiogram 06/11/2021 with normal functioning bioprosthetic aortic  valve.  His aspirin may be held for 5 to 7 days prior to his procedure. He has already has stopped it this past wed.    Home Medications    Prior to Admission medications   Medication Sig Start Date End Date Taking? Authorizing Provider  acetaminophen (TYLENOL) 500 MG tablet Take 1,000 mg by mouth in the morning and at bedtime.    [provider]  allopurinol (ZYLOPRIM) 300 MG tablet Take 300 mg by mouth in the morning.    [provider]  ALPRAZolam Duanne Moron) 0.5 MG tablet Take 0.25 mg by mouth 2 (two) times daily as needed for anxiety.    [provider]  aspirin EC 325 MG EC tablet Take 1 tablet (325 mg total) by mouth daily. 05/03/21   Elgie Collard, PA-C  atorvastatin (LIPITOR) 40 MG tablet Take 40 mg by mouth every evening.     [provider]  cholecalciferol (VITAMIN D3) 25 MCG (1000 UNIT) tablet Take 1,000 Units by mouth daily.    [provider]  fexofenadine (ALLEGRA) 180 MG tablet Take 180 mg by mouth daily.    [provider]  finasteride (PROSCAR) 5 MG tablet Take 5 mg by mouth daily.    [provider]  fluorouracil (EFUDEX) 5 % cream Apply 1 application topically daily as needed (sun damage). 11/16/20   [provider]  metoprolol tartrate (LOPRESSOR) 25 MG tablet Take 1 tablet (25 mg total) by mouth 2 (two) times daily. 05/21/21 05/21/22  Richardson Dopp T, PA-C  montelukast (SINGULAIR) 10 MG tablet Take 10 mg by mouth daily. 08/15/21   [provider]  Polyethyl Glycol-Propyl Glycol (SYSTANE OP) Place 1 drop into both eyes in the morning and at bedtime.    [provider]  potassium citrate (UROCIT-K) 10 MEQ (1080 MG) SR tablet Take 10 mEq by mouth 2 (two) times daily.    [provider]  Saline (SIMPLY SALINE) 0.9 % AERS Place 1 spray into the nose in the morning and at bedtime.    [provider]  sertraline (ZOLOFT) 100 MG tablet Take 100 mg by mouth at bedtime.    [provider]  tamsulosin (FLOMAX) 0.4 MG CAPS capsule Take 0.4 mg by mouth 2 (two) times daily.    [provider]  triamcinolone (NASACORT) 55 MCG/ACT AERO nasal inhaler Place 1 spray into the nose in the morning.    [provider]  zinc gluconate 50 MG tablet Take 50 mg by mouth daily.    [provider]    Physical Exam    Vital Signs:  Zakaria Sedor Homeyer does not have vital signs available for review today.  130s/70s, 98% oxygen  74 HR   Given telephonic nature of communication, physical exam is limited. AAOx3. NAD. Normal affect.  Speech and respirations are unlabored.  Accessory Clinical Findings    None  Assessment & Plan    1.  Preoperative Cardiovascular Risk Assessment:    Mr. Archuletta perioperative risk of a major cardiac event is 11% according to the Revised Cardiac Risk Index (RCRI).  Therefore, he is at high risk for perioperative complications.   His functional capacity is good at 5.62 METs according to the Duke Activity Status Index (DASI). Recommendations: According to ACC/AHA guidelines, no further cardiovascular testing needed.  The patient may proceed to surgery at acceptable risk.   Antiplatelet and/or Anticoagulation Recommendations: Aspirin can be held for 7 days prior to his surgery.  Please resume Aspirin post operatively when it is felt to be safe from a bleeding standpoint.   A copy of this note will be routed to requesting surgeon.  Time:   Today, I have spent 15 minutes with the patient with telehealth technology discussing medical history, symptoms, and management plan.     Elgie Collard, PA-C  03/08/2022, 8:29 AM

## 2022-03-12 ENCOUNTER — Other Ambulatory Visit: Payer: Self-pay

## 2022-03-12 ENCOUNTER — Encounter (HOSPITAL_COMMUNITY): Payer: Self-pay | Admitting: Neurological Surgery

## 2022-03-12 NOTE — Progress Notes (Signed)
Mr. Cournoyer denies chest pain or shortness of breath.  Patient denies having any s/s of Covid in his household, also denies any known exposure to Covid.   Mr. Vore PCP is Seabron Spates with [facility]. Cardiologist is Dr. Irish Lack. Mr,. Swoboda received cardiac clearance on 03/08/22.

## 2022-03-12 NOTE — Anesthesia Preprocedure Evaluation (Signed)
Anesthesia Evaluation  Patient identified by MRN, date of birth, ID band Patient awake    Reviewed: Allergy & Precautions, NPO status , Patient's Chart, lab work & pertinent test results  History of Anesthesia Complications Negative for: history of anesthetic complications  Airway Mallampati: II  TM Distance: >3 FB Neck ROM: Full    Dental  (+) Dental Advisory Given, Teeth Intact   Pulmonary former smoker,    Pulmonary exam normal        Cardiovascular + CAD, + CABG and + Peripheral Vascular Disease  Normal cardiovascular exam+ Valvular Problems/Murmurs    S/p CABG/AVR  '23 Carotid US - total occlusion right ICA, 40-59% left ICAS  '22 TTE - EF 60 to 65%. There is mild left ventricular hypertrophy. Right ventricular systolic function is mildly reduced. A small pericardial effusion is present. The pericardial effusion is posterior to the left ventricle. Trivial mitral valve regurgitation. The interatrial septum is aneurysmal. There is a 25 mm Edwards Inspiris Resilia valve present in the aortic position. Echo findings are  consistent with normal structure and function of the aortic valve prosthesis.    Neuro/Psych PSYCHIATRIC DISORDERS Anxiety Depression TIA   GI/Hepatic Neg liver ROS, hiatal hernia,   Endo/Other  negative endocrine ROS  Renal/GU negative Renal ROS     Musculoskeletal  (+) Arthritis ,   Abdominal   Peds  Hematology negative hematology ROS (+)   Anesthesia Other Findings   Reproductive/Obstetrics                           Anesthesia Physical Anesthesia Plan  ASA: 4  Anesthesia Plan: General   Post-op Pain Management: Tylenol PO (pre-op)*   Induction: Intravenous  PONV Risk Score and Plan: 3 and Treatment may vary due to age or medical condition, Ondansetron and Dexamethasone  Airway Management Planned: Oral ETT  Additional Equipment: ClearSight  Intra-op Plan:    Post-operative Plan: Extubation in OR  Informed Consent: I have reviewed the patients History and Physical, chart, labs and discussed the procedure including the risks, benefits and alternatives for the proposed anesthesia with the patient or authorized representative who has indicated his/her understanding and acceptance.     Dental advisory given  Plan Discussed with: CRNA and Anesthesiologist  Anesthesia Plan Comments:      Anesthesia Quick Evaluation

## 2022-03-12 NOTE — Progress Notes (Signed)
Anesthesia Chart Review: SAME DAY WORK-UP  Case: [id number] Date/Time: 03/13/22 1008   Procedure: Bilateral - L1-L2 - L2-L3, microdiskectomy right L1-2 (Bilateral: Back) - 3C   Anesthesia type: General   Pre-op diagnosis: stenosis   Location: [facility] OR ROOM 10 / [place] OR   Surgeons: Eustace Moore, MD       DISCUSSION: Patient is a 76 year old male scheduled for the above procedure.  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 He had bladder spasms/urinary retention post ACDF in 2015 and was discharged with a foley catheter.   Telehealth preoperative cardiology evaluation on 03/08/22 by Nicholes Rough, PA-C. She wrote, "Preoperative Cardiovascular Risk Assessment:    Mr. Molesworth perioperative risk of a major cardiac event is 11% according to the Revised Cardiac Risk Index (RCRI).  Therefore, he is at high risk for perioperative complications.   His functional capacity is good at 5.62 METs according to the Duke Activity Status Index (DASI). Recommendations: According to ACC/AHA guidelines, no further cardiovascular testing needed.  The patient may proceed to surgery at acceptable risk.   Antiplatelet and/or Anticoagulation Recommendations: Aspirin can be held for 7 days prior to his surgery.  Please resume Aspirin post operatively when it is felt to be safe from a bleeding standpoint." She also noted that he had stopped his metoprolol, "but blood pressure and heart rate have been well controlled."  He is a same day work-up, so labs and anesthesia team evaluation on the day of surgery.    VS:  BP  Readings from Last 3 Encounters:  02/11/22 118/64  12/27/21 135/72  08/31/21 112/68   Pulse Readings from Last 3 Encounters:  02/11/22 72  12/27/21 68  08/31/21 (!) 58     PROVIDERS: Josem Kaufmann, MD is PCP  Larae Grooms, MD is cardiologist   LABS: He is for labs on arrival as indicated. Last available labs from 05/21/21 show H/H 11.2/34.7, PLT 357, Cr 0.66, glucose 90.    OTHER: Colonoscopy 02/11/22: IMPRESSION: - Severe diverticulosis in the sigmoid colon and in the descending colon. There was narrowing of the colon in association with the diverticular opening. - The examination was otherwise normal on direct and retroflexion views. - No specimens collected. - Personal history of colonic polyp - diminutive adenoma 2018.   IMAGES: MRI L-spine 03/05/22: IMPRESSION: 1. Interval development of extruded disc fragment L1-2 on the right with downgoing disc material and right L2 nerve root impingement 2. Multilevel spondylosis. Other levels are similar to the prior MRI 2022.  CXR 05/30/21 (follow-up CABG/AVR 04/27/21): IMPRESSION: 1. No acute findings. Small residual right pleural effusion. Right pneumothorax noted previously has resolved.   EKG: 05/21/21 ([facility]): NSR RBBB  PAC's   CV: US Carotid 11/28/21: Summary:  Right Carotid: Evidence consistent with a total occlusion of the right  ICA.  Left Carotid: Velocities in the left ICA are consistent with a 40-59%  stenosis.  Vertebrals:  Bilateral vertebral arteries demonstrate antegrade flow.  Subclavians: Normal flow hemodynamics were seen in bilateral subclavian arteries.  Suggest follow up study in 12 months.    Echo 06/11/21: IMPRESSIONS   1. Left ventricular ejection fraction, by estimation, is 60 to 65%. The  left  ventricle has normal function. The left ventricle has no regional  wall motion abnormalities. There is mild left ventricular hypertrophy.  Left ventricular diastolic parameters   are indeterminate.   2. Right ventricular systolic function is mildly reduced. The right  ventricular size is normal. Tricuspid regurgitation signal is inadequate  for assessing PA pressure.   3. A small pericardial effusion is present. The pericardial effusion is  posterior to the left ventricle.   4. The mitral valve is normal in structure. Trivial mitral valve  regurgitation. No evidence of mitral stenosis.   5. The interatrial septum is aneurysmal   6. There is a 25 mm Edwards Inspiris Resilia valve present in the aortic  position      Aortic valve regurgitation is not visualized. Echo findings are  consistent with normal structure and function of the aortic valve  prosthesis. Aortic valve mean gradient measures 7.0 mmHg.    RHC/LHC 03/27/21 (Pre-CABG/AVR):   Ost LM lesion is 25% stenosed.   Prox LAD to Mid LAD lesion is 75% stenosed.   Mid RCA lesion is 90% stenosed.   1st Diag lesion is 50% stenosed.   Mid LAD lesion is 25% stenosed.   Severe aortic stenosis by echo.   Right Heart: Ao sat 92%, PA sat 71%, mean PA pressure 12 mmHg, mean PCW P 6 mmHg; cardiac output 6.7 L/min, cardiac index 3.5  Cardiac surgery referral for CABG/AVR eval. - S/p 3V CABG, bioprosthetic AVR 04/27/21   Past Medical History:  Diagnosis Date   Allergy    Anxiety    Aortic stenosis    Arthritis    Blood transfusion without reported diagnosis    WITH cabg X 2 04/2021   Carotid artery stenosis    9/37/90 [place]: 24-09% LICA, known 735% RICA   Complication of anesthesia    bladder spasms after neck surgery. sent home with a catheter   Coronary artery disease    Depression    Heart murmur    Hemorrhoids    Hiatal hernia    High cholesterol    History of kidney stones    Hx of adenomatous polyp of colon 01/31/2017   Numbness and tingling in left hand    Stroke ([place]) 11/09/2013   Tia    TIA (transient ischemic attack)    Urinary frequency     Past Surgical History:  Procedure Laterality  Date   ANTERIOR CERVICAL DECOMP/DISCECTOMY FUSION N/A 04/11/2014   Procedure: ANTERIOR CERVICAL DECOMPRESSION/DISCECTOMY FUSION CERVICAL THREE-FOUR,CERVICAL FOUR-FIVE,CERVICAL FIVE-SIX;  Surgeon: Erline Levine, MD;  Location: [place] [facility];  Service: Neurosurgery;  Laterality: N/A;   ANTERIOR LAT LUMBAR FUSION Left 02/20/2018   Procedure: Left Lumbar Four-Five Anterolateral lumbar interbody fusion;  Surgeon: Erline Levine, MD;  Location: [place];  Service: Neurosurgery;  Laterality: Left;   AORTIC VALVE REPLACEMENT N/A 04/27/2021   Procedure: AORTIC VALVE REPLACEMENT (AVR) USING INSPIRIS 25MM AORTIC VALVE;  Surgeon: Gaye Pollack, MD;  Location: [place] OR;  Service: Open Heart Surgery;  Laterality: N/A;   BACK SURGERY     COLONOSCOPY     CORONARY ARTERY BYPASS GRAFT N/A 04/27/2021   Procedure: CORONARY ARTERY BYPASS GRAFTING (CABG) X  3 , USING LEFT INTERNAL MAMMARY ARTERY AND RIGHT ENDOSCOPIC GREATER SAPHENOUS VEIN CONDUITS;  Surgeon: Gaye Pollack, MD;  Location: [place];  Service: Open Heart Surgery;  Laterality: N/A;   ENDOVEIN HARVEST OF GREATER SAPHENOUS VEIN Right 04/27/2021   Procedure: ENDOVEIN HARVEST OF GREATER SAPHENOUS VEIN;  Surgeon: Gilford Raid  K, MD;  Location: [place];  Service: Open Heart Surgery;  Laterality: Right;   KNEE SURGERY Right    LUMBAR PERCUTANEOUS PEDICLE SCREW 1 LEVEL N/A 02/20/2018   Procedure: Lumbar Percutaneous Pedicle Screw Placment Lumbar four-five;  Surgeon: Erline Levine, MD;  Location: [place];  Service: Neurosurgery;  Laterality: N/A;   POLYPECTOMY     RIGHT HEART CATH AND CORONARY ANGIOGRAPHY N/A 03/27/2021   Procedure: RIGHT HEART CATH AND CORONARY ANGIOGRAPHY;  Surgeon: Jettie Booze, MD;  Location: [place] CV LAB;  Service: Cardiovascular;  Laterality: N/A;   TEE WITHOUT CARDIOVERSION N/A 04/27/2021   Procedure: TRANSESOPHAGEAL ECHOCARDIOGRAM (TEE);  Surgeon: Gaye Pollack, MD;  Location: [place];  Service: Open Heart Surgery;  Laterality: N/A;    TONSILLECTOMY     TOTAL KNEE ARTHROPLASTY Right 06/08/2019   Procedure: RIGHT TOTAL KNEE ARTHROPLASTY;  Surgeon: Melrose Nakayama, MD;  [facility];  Service: Orthopedics;  Laterality: Right;   XI ROBOTIC ASSISTED INGUINAL HERNIA REPAIR WITH MESH Bilateral 11/21/2020   Procedure: ROBOTIC BILATERAL INGUINAL HERNIA REPAIR WITH MESH;  Surgeon: Ralene Ok, MD;  Location: [place];  Service: General;  Laterality: Bilateral;    MEDICATIONS: No current facility-administered medications for this encounter.    acetaminophen (TYLENOL) 500 MG tablet   allopurinol (ZYLOPRIM) 300 MG tablet   ALPRAZolam (XANAX) 0.5 MG tablet   atorvastatin (LIPITOR) 40 MG tablet   fexofenadine (ALLEGRA) 180 MG tablet   finasteride (PROSCAR) 5 MG tablet   fluorouracil (EFUDEX) 5 % cream   lidocaine 4 %   montelukast (SINGULAIR) 10 MG tablet   potassium citrate (UROCIT-K) 10 MEQ (1080 MG) SR tablet   Saline (SIMPLY SALINE) 0.9 % AERS   senna-docusate (SENOKOT-S) 8.6-50 MG tablet   sertraline (ZOLOFT) 100 MG tablet   tamsulosin (FLOMAX) 0.4 MG CAPS capsule   triamcinolone (NASACORT) 55 MCG/ACT AERO nasal inhaler   zinc gluconate 50 MG tablet   aspirin EC 325 MG EC tablet   metoprolol tartrate (LOPRESSOR) 25 MG tablet   Polyethyl Glycol-Propyl Glycol (SYSTANE OP)   ASA on hold for procedure.   Myra Gianotti, PA-C Surgical Short Stay/Anesthesiology [facility] Phone [phone] [facility] Phone [phone] 03/12/2022 11:16 AM

## 2022-03-13 ENCOUNTER — Observation Stay (HOSPITAL_COMMUNITY)
Admission: RE | Admit: 2022-03-13 | Discharge: 2022-03-13 | Disposition: A | Discharge status: Home / Self Care | Payer: Medicare Other | Attending: Neurological Surgery | Admitting: Neurological Surgery

## 2022-03-13 ENCOUNTER — Encounter (HOSPITAL_COMMUNITY): Payer: Self-pay | Admitting: Neurological Surgery

## 2022-03-13 ENCOUNTER — Ambulatory Visit (HOSPITAL_COMMUNITY)
Admission: RE | Disposition: A | Discharge status: Home / Self Care | Payer: Self-pay | Source: Home / Self Care | Attending: Neurological Surgery

## 2022-03-13 ENCOUNTER — Ambulatory Visit (HOSPITAL_COMMUNITY): Payer: Medicare Other

## 2022-03-13 ENCOUNTER — Ambulatory Visit (HOSPITAL_COMMUNITY): Payer: Medicare Other | Admitting: Vascular Surgery

## 2022-03-13 ENCOUNTER — Other Ambulatory Visit: Payer: Self-pay

## 2022-03-13 ENCOUNTER — Ambulatory Visit (HOSPITAL_BASED_OUTPATIENT_CLINIC_OR_DEPARTMENT_OTHER): Payer: Medicare Other | Admitting: Vascular Surgery

## 2022-03-13 DIAGNOSIS — Z87891 Personal history of nicotine dependence: Secondary | ICD-10-CM | POA: Diagnosis not present

## 2022-03-13 DIAGNOSIS — Z7982 Long term (current) use of aspirin: Secondary | ICD-10-CM | POA: Insufficient documentation

## 2022-03-13 DIAGNOSIS — Z79899 Other long term (current) drug therapy: Secondary | ICD-10-CM | POA: Diagnosis not present

## 2022-03-13 DIAGNOSIS — Z96651 Presence of right artificial knee joint: Secondary | ICD-10-CM | POA: Insufficient documentation

## 2022-03-13 DIAGNOSIS — M48061 Spinal stenosis, lumbar region without neurogenic claudication: Secondary | ICD-10-CM

## 2022-03-13 DIAGNOSIS — Z951 Presence of aortocoronary bypass graft: Secondary | ICD-10-CM | POA: Diagnosis not present

## 2022-03-13 DIAGNOSIS — M5126 Other intervertebral disc displacement, lumbar region: Secondary | ICD-10-CM | POA: Insufficient documentation

## 2022-03-13 DIAGNOSIS — Z8673 Personal history of transient ischemic attack (TIA), and cerebral infarction without residual deficits: Secondary | ICD-10-CM | POA: Diagnosis not present

## 2022-03-13 DIAGNOSIS — I2510 Atherosclerotic heart disease of native coronary artery without angina pectoris: Secondary | ICD-10-CM | POA: Insufficient documentation

## 2022-03-13 DIAGNOSIS — Z9889 Other specified postprocedural states: Secondary | ICD-10-CM

## 2022-03-13 HISTORY — DX: Occlusion and stenosis of unspecified carotid artery: I65.29

## 2022-03-13 HISTORY — PX: LUMBAR LAMINECTOMY/DECOMPRESSION MICRODISCECTOMY: SHX5026

## 2022-03-13 LAB — CBC
HCT: 41.9 % (ref 39.0–52.0)
Hemoglobin: 13.2 g/dL (ref 13.0–17.0)
MCH: 32.7 pg (ref 26.0–34.0)
MCHC: 31.5 g/dL (ref 30.0–36.0)
MCV: 103.7 fL — ABNORMAL HIGH (ref 80.0–100.0)
Platelets: 157 10*3/uL (ref 150–400)
RBC: 4.04 MIL/uL — ABNORMAL LOW (ref 4.22–5.81)
RDW: 13.5 % (ref 11.5–15.5)
WBC: 4.6 10*3/uL (ref 4.0–10.5)
nRBC: 0 % (ref 0.0–0.2)

## 2022-03-13 LAB — BASIC METABOLIC PANEL
Anion gap: 8 (ref 5–15)
BUN: 19 mg/dL (ref 8–23)
CO2: 25 mmol/L (ref 22–32)
Calcium: 9 mg/dL (ref 8.9–10.3)
Chloride: 108 mmol/L (ref 98–111)
Creatinine, Ser: 0.64 mg/dL (ref 0.61–1.24)
GFR, Estimated: >60 mL/min (ref >60)
Glucose, Bld: 98 mg/dL (ref 70–99)
Potassium: 3.8 mmol/L (ref 3.5–5.1)
Sodium: 141 mmol/L (ref 135–145)

## 2022-03-13 LAB — SURGICAL PCR SCREEN
MRSA, PCR: NEGATIVE
Staphylococcus aureus: POSITIVE — AB

## 2022-03-13 SURGERY — LUMBAR LAMINECTOMY/DECOMPRESSION MICRODISCECTOMY 2 LEVELS
Anesthesia: General | Site: Back | Laterality: Bilateral

## 2022-03-13 MED ORDER — MORPHINE SULFATE (PF) 2 MG/ML IV SOLN: 2.0000 mg | INTRAVENOUS | Status: DC | PRN

## 2022-03-13 MED ORDER — BUPIVACAINE HCL (PF) 0.25 % IJ SOLN
INTRAMUSCULAR | Status: AC
Start: 2022-03-13 — End: ?
  Filled 2022-03-13: qty 30

## 2022-03-13 MED ORDER — PROPOFOL 10 MG/ML IV BOLUS
INTRAVENOUS | Status: AC
  Filled 2022-03-13: qty 20

## 2022-03-13 MED ORDER — CEFAZOLIN SODIUM-DEXTROSE 2-4 GM/100ML-% IV SOLN
2.0000 g | Freq: Three times a day (TID) | INTRAVENOUS | Status: DC
  Administered 2022-03-13: 2 g via INTRAVENOUS
  Filled 2022-03-13: qty 100

## 2022-03-13 MED ORDER — PROPOFOL 10 MG/ML IV BOLUS
INTRAVENOUS | Status: DC | PRN
  Administered 2022-03-13: 150 mg via INTRAVENOUS

## 2022-03-13 MED ORDER — ROCURONIUM BROMIDE 10 MG/ML (PF) SYRINGE
PREFILLED_SYRINGE | INTRAVENOUS | Status: AC
  Filled 2022-03-13: qty 10

## 2022-03-13 MED ORDER — SUGAMMADEX SODIUM 200 MG/2ML IV SOLN
INTRAVENOUS | Status: DC | PRN
  Administered 2022-03-13: 200 mg via INTRAVENOUS

## 2022-03-13 MED ORDER — LIDOCAINE 2% (20 MG/ML) 5 ML SYRINGE
INTRAMUSCULAR | Status: DC | PRN
  Administered 2022-03-13: 60 mg via INTRAVENOUS

## 2022-03-13 MED ORDER — SENNA 8.6 MG PO TABS
1.0000 | ORAL_TABLET | Freq: Two times a day (BID) | ORAL | Status: DC
  Administered 2022-03-13: 8.6 mg via ORAL
  Filled 2022-03-13: qty 1

## 2022-03-13 MED ORDER — SERTRALINE HCL 50 MG PO TABS: 100.0000 mg | ORAL_TABLET | Freq: Every day | ORAL | Status: DC

## 2022-03-13 MED ORDER — PHENYLEPHRINE 80 MCG/ML (10ML) SYRINGE FOR IV PUSH (FOR BLOOD PRESSURE SUPPORT)
PREFILLED_SYRINGE | INTRAVENOUS | Status: DC | PRN
  Administered 2022-03-13 (×3): 160 ug via INTRAVENOUS

## 2022-03-13 MED ORDER — TRIAMCINOLONE ACETONIDE 55 MCG/ACT NA AERO
1.0000 | INHALATION_SPRAY | Freq: Every morning | NASAL | Status: DC
  Filled 2022-03-13: qty 10.8

## 2022-03-13 MED ORDER — ORAL CARE MOUTH RINSE: 15.0000 mL | Freq: Once | OROMUCOSAL | Status: AC

## 2022-03-13 MED ORDER — THROMBIN (RECOMBINANT) 5000 UNITS EX SOLR
CUTANEOUS | Status: DC | PRN
  Administered 2022-03-13: 10 mL via TOPICAL

## 2022-03-13 MED ORDER — ONDANSETRON HCL 4 MG/2ML IJ SOLN
INTRAMUSCULAR | Status: AC
  Filled 2022-03-13: qty 2

## 2022-03-13 MED ORDER — ONDANSETRON HCL 4 MG/2ML IJ SOLN: 4.0000 mg | Freq: Once | INTRAMUSCULAR | Status: DC | PRN

## 2022-03-13 MED ORDER — ONDANSETRON HCL 4 MG PO TABS: 4.0000 mg | ORAL_TABLET | Freq: Four times a day (QID) | ORAL | Status: DC | PRN

## 2022-03-13 MED ORDER — CEFAZOLIN SODIUM-DEXTROSE 2-4 GM/100ML-% IV SOLN
2.0000 g | INTRAVENOUS | Status: AC
  Administered 2022-03-13: 2 g via INTRAVENOUS
  Filled 2022-03-13: qty 100

## 2022-03-13 MED ORDER — SUCCINYLCHOLINE CHLORIDE 200 MG/10ML IV SOSY
PREFILLED_SYRINGE | INTRAVENOUS | Status: AC
Start: 2022-03-13 — End: ?
  Filled 2022-03-13: qty 10

## 2022-03-13 MED ORDER — PHENYLEPHRINE HCL-NACL 20-0.9 MG/250ML-% IV SOLN
INTRAVENOUS | Status: DC | PRN
  Administered 2022-03-13: 40 ug/min via INTRAVENOUS

## 2022-03-13 MED ORDER — SENNOSIDES-DOCUSATE SODIUM 8.6-50 MG PO TABS: 2.0000 | ORAL_TABLET | Freq: Every day | ORAL | Status: DC

## 2022-03-13 MED ORDER — FENTANYL CITRATE (PF) 250 MCG/5ML IJ SOLN
INTRAMUSCULAR | Status: DC | PRN
  Administered 2022-03-13: 100 ug via INTRAVENOUS

## 2022-03-13 MED ORDER — MENTHOL 3 MG MT LOZG: 1.0000 | LOZENGE | OROMUCOSAL | Status: DC | PRN

## 2022-03-13 MED ORDER — ALLOPURINOL 300 MG PO TABS: 300.0000 mg | ORAL_TABLET | Freq: Every morning | ORAL | Status: DC

## 2022-03-13 MED ORDER — MONTELUKAST SODIUM 10 MG PO TABS: 10.0000 mg | ORAL_TABLET | Freq: Every day | ORAL | Status: DC

## 2022-03-13 MED ORDER — DEXAMETHASONE 4 MG PO TABS: 4.0000 mg | ORAL_TABLET | Freq: Four times a day (QID) | ORAL | Status: DC

## 2022-03-13 MED ORDER — DEXAMETHASONE SODIUM PHOSPHATE 10 MG/ML IJ SOLN
INTRAMUSCULAR | Status: AC
  Filled 2022-03-13: qty 1

## 2022-03-13 MED ORDER — FINASTERIDE 5 MG PO TABS
5.0000 mg | ORAL_TABLET | Freq: Every day | ORAL | Status: DC
  Filled 2022-03-13: qty 1

## 2022-03-13 MED ORDER — METHOCARBAMOL 500 MG PO TABS: 500.0000 mg | ORAL_TABLET | Freq: Four times a day (QID) | ORAL | Status: DC | PRN

## 2022-03-13 MED ORDER — ACETAMINOPHEN 500 MG PO TABS: 1000.0000 mg | ORAL_TABLET | Freq: Four times a day (QID) | ORAL | Status: DC

## 2022-03-13 MED ORDER — LIDOCAINE 2% (20 MG/ML) 5 ML SYRINGE
INTRAMUSCULAR | Status: AC
  Filled 2022-03-13: qty 5

## 2022-03-13 MED ORDER — THROMBIN 5000 UNITS EX SOLR
CUTANEOUS | Status: AC
  Filled 2022-03-13: qty 15000

## 2022-03-13 MED ORDER — ROCURONIUM BROMIDE 10 MG/ML (PF) SYRINGE
PREFILLED_SYRINGE | INTRAVENOUS | Status: DC | PRN
  Administered 2022-03-13: 60 mg via INTRAVENOUS
  Administered 2022-03-13: 10 mg via INTRAVENOUS

## 2022-03-13 MED ORDER — OXYCODONE HCL 5 MG PO TABS
5.0000 mg | ORAL_TABLET | ORAL | Status: DC | PRN
  Administered 2022-03-13: 5 mg via ORAL
  Filled 2022-03-13: qty 1

## 2022-03-13 MED ORDER — OXYCODONE HCL 5 MG/5ML PO SOLN: 5.0000 mg | Freq: Once | ORAL | Status: DC | PRN

## 2022-03-13 MED ORDER — SODIUM CHLORIDE 0.9 % IV SOLN: 250.0000 mL | INTRAVENOUS | Status: DC

## 2022-03-13 MED ORDER — PHENOL 1.4 % MT LIQD: 1.0000 | OROMUCOSAL | Status: DC | PRN

## 2022-03-13 MED ORDER — FENTANYL CITRATE (PF) 100 MCG/2ML IJ SOLN: 25.0000 ug | INTRAMUSCULAR | Status: DC | PRN

## 2022-03-13 MED ORDER — ZINC SULFATE 220 (50 ZN) MG PO CAPS
220.0000 mg | ORAL_CAPSULE | Freq: Every day | ORAL | Status: DC
  Filled 2022-03-13: qty 1

## 2022-03-13 MED ORDER — 0.9 % SODIUM CHLORIDE (POUR BTL) OPTIME
TOPICAL | Status: DC | PRN
  Administered 2022-03-13: 1000 mL

## 2022-03-13 MED ORDER — CHLORHEXIDINE GLUCONATE 0.12 % MT SOLN
15.0000 mL | Freq: Once | OROMUCOSAL | Status: AC
  Administered 2022-03-13: 15 mL via OROMUCOSAL
  Filled 2022-03-13: qty 15

## 2022-03-13 MED ORDER — ONDANSETRON HCL 4 MG/2ML IJ SOLN: 4.0000 mg | Freq: Four times a day (QID) | INTRAMUSCULAR | Status: DC | PRN

## 2022-03-13 MED ORDER — TAMSULOSIN HCL 0.4 MG PO CAPS: 0.4000 mg | ORAL_CAPSULE | Freq: Two times a day (BID) | ORAL | Status: DC

## 2022-03-13 MED ORDER — SODIUM CHLORIDE 0.9% FLUSH: 3.0000 mL | INTRAVENOUS | Status: DC | PRN

## 2022-03-13 MED ORDER — CHLORHEXIDINE GLUCONATE CLOTH 2 % EX PADS: 6.0000 | MEDICATED_PAD | Freq: Once | CUTANEOUS | Status: DC

## 2022-03-13 MED ORDER — CELECOXIB 200 MG PO CAPS
200.0000 mg | ORAL_CAPSULE | Freq: Two times a day (BID) | ORAL | Status: DC
  Administered 2022-03-13: 200 mg via ORAL
  Filled 2022-03-13: qty 1

## 2022-03-13 MED ORDER — ACETAMINOPHEN 500 MG PO TABS
1000.0000 mg | ORAL_TABLET | Freq: Once | ORAL | Status: AC
Start: 2022-03-13 — End: 2022-03-13
  Administered 2022-03-13: 1000 mg via ORAL
  Filled 2022-03-13: qty 2

## 2022-03-13 MED ORDER — POTASSIUM CHLORIDE IN NACL 20-0.9 MEQ/L-% IV SOLN: INTRAVENOUS | Status: DC

## 2022-03-13 MED ORDER — SODIUM CHLORIDE 0.9% FLUSH: 3.0000 mL | Freq: Two times a day (BID) | INTRAVENOUS | Status: DC

## 2022-03-13 MED ORDER — LACTATED RINGERS IV SOLN: INTRAVENOUS | Status: DC

## 2022-03-13 MED ORDER — POTASSIUM CITRATE ER 10 MEQ (1080 MG) PO TBCR
10.0000 meq | EXTENDED_RELEASE_TABLET | Freq: Two times a day (BID) | ORAL | Status: DC
Start: 2022-03-13 — End: 2022-03-13
  Filled 2022-03-13 (×2): qty 1

## 2022-03-13 MED ORDER — GABAPENTIN 300 MG PO CAPS
300.0000 mg | ORAL_CAPSULE | ORAL | Status: AC
  Administered 2022-03-13: 300 mg via ORAL
  Filled 2022-03-13: qty 1

## 2022-03-13 MED ORDER — OXYCODONE HCL 5 MG PO TABS: 5.0000 mg | ORAL_TABLET | Freq: Once | ORAL | Status: DC | PRN

## 2022-03-13 MED ORDER — FENTANYL CITRATE (PF) 250 MCG/5ML IJ SOLN
INTRAMUSCULAR | Status: AC
  Filled 2022-03-13: qty 5

## 2022-03-13 MED ORDER — BUPIVACAINE HCL (PF) 0.25 % IJ SOLN
INTRAMUSCULAR | Status: DC | PRN
  Administered 2022-03-13: 2 mL
  Administered 2022-03-13: 10 mL

## 2022-03-13 MED ORDER — DEXAMETHASONE SODIUM PHOSPHATE 10 MG/ML IJ SOLN
INTRAMUSCULAR | Status: DC | PRN
  Administered 2022-03-13: 10 mg via INTRAVENOUS

## 2022-03-13 MED ORDER — DEXAMETHASONE SODIUM PHOSPHATE 4 MG/ML IJ SOLN: 4.0000 mg | Freq: Four times a day (QID) | INTRAMUSCULAR | Status: DC

## 2022-03-13 MED ORDER — ONDANSETRON HCL 4 MG/2ML IJ SOLN
INTRAMUSCULAR | Status: DC | PRN
  Administered 2022-03-13: 4 mg via INTRAVENOUS

## 2022-03-13 MED ORDER — EPHEDRINE SULFATE-NACL 50-0.9 MG/10ML-% IV SOSY
PREFILLED_SYRINGE | INTRAVENOUS | Status: DC | PRN
  Administered 2022-03-13 (×4): 5 mg via INTRAVENOUS

## 2022-03-13 MED ORDER — METHOCARBAMOL 1000 MG/10ML IJ SOLN: 500.0000 mg | Freq: Four times a day (QID) | INTRAVENOUS | Status: DC | PRN

## 2022-03-13 MED ORDER — THROMBIN 5000 UNITS EX SOLR
OROMUCOSAL | Status: DC | PRN
  Administered 2022-03-13: 5 mL via TOPICAL

## 2022-03-13 SURGICAL SUPPLY — 42 items
BAG COUNTER SPONGE SURGICOUNT (BAG) ×1 IMPLANT
BAND RUBBER #18 3X1/16 STRL (MISCELLANEOUS) ×2 IMPLANT
BENZOIN TINCTURE PRP APPL 2/3 (GAUZE/BANDAGES/DRESSINGS) ×1 IMPLANT
BUR CARBIDE MATCH 3.0 (BURR) ×1 IMPLANT
CANISTER SUCT 3000ML PPV (MISCELLANEOUS) ×1 IMPLANT
DERMABOND ADVANCED (GAUZE/BANDAGES/DRESSINGS) ×1
DERMABOND ADVANCED .7 DNX12 (GAUZE/BANDAGES/DRESSINGS) IMPLANT
DRAPE LAPAROTOMY 100X72X124 (DRAPES) ×1 IMPLANT
DRAPE MICROSCOPE LEICA (MISCELLANEOUS) ×1 IMPLANT
DRAPE SURG 17X23 STRL (DRAPES) ×1 IMPLANT
DRSG OPSITE 4X5.5 SM (GAUZE/BANDAGES/DRESSINGS) IMPLANT
DURAPREP 26ML APPLICATOR (WOUND CARE) ×1 IMPLANT
ELECT REM PT RETURN 9FT ADLT (ELECTROSURGICAL) ×1
ELECTRODE REM PT RTRN 9FT ADLT (ELECTROSURGICAL) ×1 IMPLANT
GAUZE 4X4 16PLY ~~LOC~~+RFID DBL (SPONGE) IMPLANT
GLOVE BIO SURGEON STRL SZ7 (GLOVE) IMPLANT
GLOVE BIO SURGEON STRL SZ8 (GLOVE) ×1 IMPLANT
GLOVE BIOGEL PI IND STRL 7.0 (GLOVE) IMPLANT
GLOVE BIOGEL PI INDICATOR 7.0 (GLOVE)
GOWN STRL REUS W/ TWL LRG LVL3 (GOWN DISPOSABLE) IMPLANT
GOWN STRL REUS W/ TWL XL LVL3 (GOWN DISPOSABLE) ×1 IMPLANT
GOWN STRL REUS W/TWL 2XL LVL3 (GOWN DISPOSABLE) IMPLANT
GOWN STRL REUS W/TWL LRG LVL3 (GOWN DISPOSABLE)
GOWN STRL REUS W/TWL XL LVL3 (GOWN DISPOSABLE) ×1
HEMOSTAT POWDER KIT SURGIFOAM (HEMOSTASIS) ×1 IMPLANT
KIT BASIN OR (CUSTOM PROCEDURE TRAY) ×1 IMPLANT
KIT TURNOVER KIT B (KITS) ×1 IMPLANT
NDL HYPO 25X1 1.5 SAFETY (NEEDLE) ×1 IMPLANT
NDL SPNL 20GX3.5 QUINCKE YW (NEEDLE) IMPLANT
NEEDLE HYPO 25X1 1.5 SAFETY (NEEDLE) ×1 IMPLANT
NEEDLE SPNL 20GX3.5 QUINCKE YW (NEEDLE) ×1 IMPLANT
NS IRRIG 1000ML POUR BTL (IV SOLUTION) ×1 IMPLANT
PACK LAMINECTOMY NEURO (CUSTOM PROCEDURE TRAY) ×1 IMPLANT
PAD ARMBOARD 7.5X6 YLW CONV (MISCELLANEOUS) ×3 IMPLANT
STRIP CLOSURE SKIN 1/2X4 (GAUZE/BANDAGES/DRESSINGS) ×1 IMPLANT
SUT VIC AB 0 CT1 18XCR BRD8 (SUTURE) ×1 IMPLANT
SUT VIC AB 0 CT1 8-18 (SUTURE) ×1
SUT VIC AB 2-0 CP2 18 (SUTURE) ×1 IMPLANT
SUT VIC AB 3-0 SH 8-18 (SUTURE) ×1 IMPLANT
TOWEL GREEN STERILE (TOWEL DISPOSABLE) ×1 IMPLANT
TOWEL GREEN STERILE FF (TOWEL DISPOSABLE) ×1 IMPLANT
WATER STERILE IRR 1000ML POUR (IV SOLUTION) ×1 IMPLANT

## 2022-03-13 NOTE — Plan of Care (Signed)
Pt doing well. Pt and wife given D/C instructions with verbal understanding. Pt's incision is clean and dry with no sign of infection. Pt's IV was removed prior to D/C. Pt D/C'd home via wheelchair per MD order. Pt is stable @ D/C and has no other needs at this time. Holli Humbles, RN

## 2022-03-13 NOTE — Discharge Summary (Signed)
Physician Discharge Summary  Patient ID: Nicholas Wise MRN: [id number] DOB/AGE: 18-Jan-1946 76 y.o.  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 The patient tolerated the procedure well and was taken to the recovery room and then to the floor in stable condition. The hospital course was routine. There were no complications. The wound remained clean dry and intact. Pt had appropriate back soreness. No complaints of leg pain or new N/T/W. The patient remained afebrile with stable vital signs, and tolerated a regular diet. The patient continued to increase activities, and pain was well controlled with oral pain medications.   Consults: None  Significant Diagnostic Studies:  Results for orders placed or performed during the hospital encounter of 03/13/22  Surgical pcr screen   Specimen: Nasal Mucosa; Nasal Swab  Result Value Ref Range   MRSA, PCR NEGATIVE NEGATIVE   Staphylococcus aureus POSITIVE (A) NEGATIVE  Basic metabolic panel per protocol  Result Value Ref Range   Sodium 141 135 - 145 mmol/L   Potassium 3.8 3.5 - 5.1 mmol/L   Chloride 108 98 - 111 mmol/L   CO2 25 22 - 32 mmol/L   Glucose, Bld 98 70 - 99 mg/dL   BUN 19 8 - 23 mg/dL   Creatinine, Ser 0.64 0.61 - 1.24 mg/dL   Calcium 9.0 8.9 - 10.3 mg/dL   GFR, Estimated >60 >60 mL/min   Anion gap 8 5 - 15  CBC per protocol  Result Value Ref Range   WBC 4.6 4.0 - 10.5 K/uL   RBC 4.04 (L) 4.22 - 5.81 MIL/uL   Hemoglobin 13.2 13.0 - 17.0 g/dL   HCT 41.9 39.0 - 52.0 %   MCV 103.7 (H) 80.0 - 100.0 fL   MCH 32.7 26.0 - 34.0 pg   MCHC 31.5 30.0 - 36.0 g/dL   RDW 13.5 11.5 - 15.5 %    Platelets 157 150 - 400 K/uL   nRBC 0.0 0.0 - 0.2 %    DG Lumbar Spine 2-3 Views  Result Date: 03/13/2022 CLINICAL DATA:  Lumbar microdiskectomy. EXAM: LUMBAR SPINE - 2-3 VIEW COMPARISON:  Lumbar spine MRI 03/05/2022 FINDINGS: 1109 hours. Same numbering scheme as on the MRI shows a localization needle overlying the posterior back, superimposed on the L1 spinous process. Fusion hardware again noted L4-5. IMPRESSION: Intraoperative localization. Electronically Signed   By: Misty Stanley M.D.   On: 03/13/2022 13:23   MR Lumbar Spine W Wo Contrast  Result Date: 03/05/2022 CLINICAL DATA:  Lumbar radiculopathy. Low back pain with right leg pain EXAM: MRI LUMBAR SPINE WITHOUT AND WITH CONTRAST TECHNIQUE: Multiplanar and multiecho pulse sequences of the lumbar spine were obtained without and with intravenous contrast. CONTRAST:  91m MULTIHANCE GADOBENATE DIMEGLUMINE 529 MG/ML IV SOLN COMPARISON:  Lumbar MRI 01/27/2021 FINDINGS: Segmentation:  5 lumbar vertebra.  Lowest disc space L5-S1. Alignment: Mild retrolisthesis L1-2, L2-3, L3-4. Mild dextroscoliosis Vertebrae:  Negative for fracture or mass.  PLIF L4-5 Conus medullaris and cauda equina: Conus extends to the T12-L1 level. Conus and cauda equina appear normal. Paraspinal and other soft tissues: Negative for paraspinous mass or adenopathy. Disc levels: T12-L1: Negative L1-2: Mild retrolisthesis. Diffuse disc bulging and bilateral facet  hypertrophy. Moderate central canal stenosis. Extruded disc fragment on the right with downgoing disc fragment. Right L2 nerve root impingement. Bilateral subarticular stenosis right greater than left. L2-3: Disc degeneration asymmetric to the left with disc space narrowing and spurring. Moderate facet hypertrophy bilaterally. Mild to moderate central canal stenosis with mild progression. Moderate left subarticular and foraminal stenosis. Mild right subarticular stenosis L3-4: Moderate to advanced disc degeneration with disc  space narrowing and endplate spurring. Bilateral facet hypertrophy. Mild central canal stenosis. Moderate left subarticular stenosis. L4-5: Pedicle screw and interbody fusion.  Negative for stenosis L5-S1: Moderate disc degeneration, asymmetric to the left. Disc space narrowing and spurring with bilateral facet hypertrophy. Mild subarticular stenosis bilaterally. IMPRESSION: 1. Interval development of extruded disc fragment L1-2 on the right with downgoing disc material and right L2 nerve root impingement 2. Multilevel spondylosis. Other levels are similar to the prior MRI 2022. Electronically Signed   By: Franchot Gallo M.D.   On: 03/05/2022 08:56    Antibiotics:  Anti-infectives (From admission, onward)    Start     Dose/Rate Route Frequency Ordered Stop   03/13/22 1500  ceFAZolin (ANCEF) IVPB 2g/100 mL premix        2 g 200 mL/hr over 30 Minutes Intravenous Every 8 hours 03/13/22 1404 03/14/22 0659   03/13/22 0815  ceFAZolin (ANCEF) IVPB 2g/100 mL premix        2 g 200 mL/hr over 30 Minutes Intravenous On call to O.R. 03/13/22 0801 03/13/22 1056       Discharge Exam: Blood pressure 125/68, pulse 74, temperature 97.8 F (36.6 C), resp. rate 18, height '5\' 9"'$  (1.753 m), weight 73.9 kg, SpO2 99 %. Neurologic: Grossly normal Ambulating and voiding well incision cdi   Discharge Medications:   Allergies as of 03/13/2022       Reactions   Bee Pollen Cough        Medication List     TAKE these medications    acetaminophen 500 MG tablet Commonly known as: TYLENOL Take 1,000 mg by mouth in the morning and at bedtime.   allopurinol 300 MG tablet Commonly known as: ZYLOPRIM Take 300 mg by mouth in the morning.   ALPRAZolam 0.5 MG tablet Commonly known as: XANAX Take 0.25 mg by mouth at bedtime.   aspirin EC 325 MG tablet Take 1 tablet (325 mg total) by mouth daily. What changed: when to take this   atorvastatin 40 MG tablet Commonly known as: LIPITOR Take 40 mg by mouth  every evening.   fexofenadine 180 MG tablet Commonly known as: ALLEGRA Take 180 mg by mouth daily.   finasteride 5 MG tablet Commonly known as: PROSCAR Take 5 mg by mouth daily.   fluorouracil 5 % cream Commonly known as: EFUDEX Apply 1 application topically daily as needed (sun damage).   lidocaine 4 % Place 1 patch onto the skin daily.   metoprolol tartrate 25 MG tablet Commonly known as: LOPRESSOR Take 1 tablet (25 mg total) by mouth 2 (two) times daily.   montelukast 10 MG tablet Commonly known as: SINGULAIR Take 10 mg by mouth at bedtime.   potassium citrate 10 MEQ (1080 MG) SR tablet Commonly known as: UROCIT-K Take 10 mEq by mouth 2 (two) times daily.   senna-docusate 8.6-50 MG tablet Commonly known as: Senokot-S Take 2 tablets by mouth at bedtime.   sertraline 100 MG tablet Commonly known as: ZOLOFT Take 100 mg by mouth at bedtime.   Simply Saline 0.9 % Aers Generic  drug: Saline Place 1 spray into the nose in the morning and at bedtime.   SYSTANE OP Place 1 drop into both eyes in the morning and at bedtime.   tamsulosin 0.4 MG Caps capsule Commonly known as: FLOMAX Take 0.4 mg by mouth 2 (two) times daily.   triamcinolone 55 MCG/ACT Aero nasal inhaler Commonly known as: NASACORT Place 1 spray into the nose in the morning.   zinc gluconate 50 MG tablet Take 50 mg by mouth daily.        Disposition: home   Final Dx: laminectomy L1-2, L2-3 with microdiskectomy L1-2  Discharge Instructions      Remove dressing in 72 hours   Complete by: As directed    Call MD for:  difficulty breathing, headache or visual disturbances   Complete by: As directed    Call MD for:  hives   Complete by: As directed    Call MD for:  persistant nausea and vomiting   Complete by: As directed    Call MD for:  redness, tenderness, or signs of infection (pain, swelling, redness, odor or green/yellow discharge around incision site)   Complete by: As directed    Call  MD for:  severe uncontrolled pain   Complete by: As directed    Call MD for:  temperature >100.4   Complete by: As directed    Diet - low sodium heart healthy   Complete by: As directed    Driving Restrictions   Complete by: As directed    No lifting more than 8 lbs   Increase activity slowly   Complete by: As directed    Lifting restrictions   Complete by: As directed    No lifting more than 8 lbs          Signed: Ocie Cornfield Jackson Fetters 03/13/2022, 4:15 PM

## 2022-03-13 NOTE — Anesthesia Procedure Notes (Signed)
Procedure Name: Intubation Date/Time: 03/13/2022 10:55 AM  Performed by: Lowella Dell, CRNAPre-anesthesia Checklist: Patient identified, Emergency Drugs available, Suction available and Patient being monitored Patient Re-evaluated:Patient Re-evaluated prior to induction Oxygen Delivery Method: Circle System Utilized Preoxygenation: Pre-oxygenation with 100% oxygen Induction Type: IV induction Ventilation: Mask ventilation without difficulty Laryngoscope Size: Mac and 4 Grade View: Grade I Tube type: Oral Tube size: 7.5 mm Number of attempts: 2 (ETT migrated out of cords with balloon inflation, second attempt made, was successful) Airway Equipment and Method: Stylet and Oral airway Placement Confirmation: ETT inserted through vocal cords under direct vision, positive ETCO2 and breath sounds checked- equal and bilateral Secured at: 23 cm Tube secured with: Tape Dental Injury: Teeth and Oropharynx as per pre-operative assessment  Future Recommendations: Recommend- induction with short-acting agent, and alternative techniques readily available

## 2022-03-13 NOTE — H&P (Signed)
Subjective: Patient is a 76 y.o. male admitted for LL for stenosis. Onset of symptoms was several months ago, gradually worsening since that time.  The pain is rated severe, and is located at the across the lower back and radiates to RLE. The pain is described as aching and occurs all day. The symptoms have been progressive. Symptoms are exacerbated by exercise, standing, and walking for more than a few minutes. MRI or CT showed stenosis with HNP   Past Medical History:  Diagnosis Date   Allergy    Anxiety    Aortic stenosis    Arthritis    Blood transfusion without reported diagnosis    WITH cabg X 2 04/2021   Carotid artery stenosis    0/93/81 [place]: 82-99% LICA, known 371% RICA   Complication of anesthesia    bladder spasms after neck surgery. sent home with a catheter   Coronary artery disease    Depression    Heart murmur    Hemorrhoids    Hiatal hernia    High cholesterol    History of kidney stones    passed x 1   Hx of adenomatous polyp of colon 01/31/2017   Numbness and tingling in left hand    Stroke ([place]) 11/09/2013   Tia    TIA (transient ischemic attack)    no residual effects   Urinary frequency     Past Surgical History:  Procedure Laterality Date   ANTERIOR CERVICAL DECOMP/DISCECTOMY FUSION N/A 04/11/2014   Procedure: ANTERIOR CERVICAL DECOMPRESSION/DISCECTOMY FUSION CERVICAL THREE-FOUR,CERVICAL FOUR-FIVE,CERVICAL FIVE-SIX;  Surgeon: Erline Levine, MD;  Location: [place] [facility];  Service: Neurosurgery;  Laterality: N/A;   ANTERIOR LAT LUMBAR FUSION Left 02/20/2018   Procedure: Left Lumbar Four-Five Anterolateral lumbar interbody fusion;  Surgeon: Erline Levine, MD;  Location: [facility];  Service: Neurosurgery;  Laterality: Left;   AORTIC VALVE REPLACEMENT N/A 04/27/2021   Procedure: AORTIC VALVE REPLACEMENT (AVR) USING INSPIRIS 25MM AORTIC VALVE;  Surgeon: Gaye Pollack, MD;  Location: [place] [facility] [facility];  Service: Open Heart Surgery;  Laterality: N/A;   BACK SURGERY      COLONOSCOPY     CORONARY ARTERY BYPASS GRAFT N/A 04/27/2021   Procedure: CORONARY ARTERY BYPASS GRAFTING (CABG) X  3 , USING LEFT INTERNAL MAMMARY ARTERY AND RIGHT ENDOSCOPIC GREATER SAPHENOUS VEIN CONDUITS;  Surgeon: Gaye Pollack, MD;  Location: [facility];  Service: Open Heart Surgery;  Laterality: N/A;   ENDOVEIN HARVEST OF GREATER SAPHENOUS VEIN Right 04/27/2021   Procedure: ENDOVEIN HARVEST OF GREATER SAPHENOUS VEIN;  Surgeon: Gaye Pollack, MD;  Location: [place];  Service: Open Heart Surgery;  Laterality: Right;   KNEE SURGERY Right    LUMBAR PERCUTANEOUS PEDICLE SCREW 1 LEVEL N/A 02/20/2018   Procedure: Lumbar Percutaneous Pedicle Screw Placment Lumbar four-five;  Surgeon: Erline Levine, MD;  Location: [facility];  Service: Neurosurgery;  Laterality: N/A;   POLYPECTOMY     RIGHT HEART CATH AND CORONARY ANGIOGRAPHY N/A 03/27/2021   Procedure: RIGHT HEART CATH AND CORONARY ANGIOGRAPHY;  Surgeon: Jettie Booze, MD;  Location: [place] [facility];  Service: Cardiovascular;  Laterality: N/A;   TEE WITHOUT CARDIOVERSION N/A 04/27/2021   Procedure: TRANSESOPHAGEAL ECHOCARDIOGRAM (TEE);  Surgeon: Gaye Pollack, MD;  Location: [place];  Service: Open Heart Surgery;  Laterality: N/A;   TONSILLECTOMY     TOTAL KNEE ARTHROPLASTY Right 06/08/2019   Procedure: RIGHT TOTAL KNEE ARTHROPLASTY;  Surgeon: Melrose Nakayama, MD;  Location: [facility];  Service: Orthopedics;  Laterality: Right;  XI ROBOTIC ASSISTED INGUINAL HERNIA REPAIR WITH MESH Bilateral 11/21/2020   Procedure: ROBOTIC BILATERAL INGUINAL HERNIA REPAIR WITH MESH;  Surgeon: Ralene Ok, MD;  Location: [facility];  Service: General;  Laterality: Bilateral;    Prior to Admission medications   Medication Sig Start Date End Date Taking? Authorizing Provider  acetaminophen (TYLENOL) 500 MG tablet Take 1,000 mg by mouth in the morning and at bedtime.   Yes [provider]  allopurinol (ZYLOPRIM) 300 MG tablet Take 300 mg by mouth in the  morning.   Yes [provider]  ALPRAZolam Duanne Moron) 0.5 MG tablet Take 0.25 mg by mouth at bedtime.   Yes [provider]  atorvastatin (LIPITOR) 40 MG tablet Take 40 mg by mouth every evening.    Yes [provider]  fexofenadine (ALLEGRA) 180 MG tablet Take 180 mg by mouth daily.   Yes [provider]  finasteride (PROSCAR) 5 MG tablet Take 5 mg by mouth daily.   Yes [provider]  lidocaine 4 % Place 1 patch onto the skin daily.   Yes [provider]  montelukast (SINGULAIR) 10 MG tablet Take 10 mg by mouth at bedtime. 08/15/21  Yes [provider]  Polyethyl Glycol-Propyl Glycol (SYSTANE OP) Place 1 drop into both eyes in the morning and at bedtime.   Yes [provider]  potassium citrate (UROCIT-K) 10 MEQ (1080 MG) SR tablet Take 10 mEq by mouth 2 (two) times daily.   Yes [provider]  Saline (SIMPLY SALINE) 0.9 % AERS Place 1 spray into the nose in the morning and at bedtime.   Yes [provider]  senna-docusate (SENOKOT-S) 8.6-50 MG tablet Take 2 tablets by mouth at bedtime.   Yes [provider]  sertraline (ZOLOFT) 100 MG tablet Take 100 mg by mouth at bedtime.   Yes [provider]  tamsulosin (FLOMAX) 0.4 MG CAPS capsule Take 0.4 mg by mouth 2 (two) times daily.   Yes [provider]  triamcinolone (NASACORT) 55 MCG/ACT AERO nasal inhaler Place 1 spray into the nose in the morning.   Yes [provider]  zinc gluconate 50 MG tablet Take 50 mg by mouth daily.   Yes [provider]  aspirin EC 325 MG EC tablet Take 1 tablet (325 mg total) by mouth daily. Patient taking differently: Take 325 mg by mouth at bedtime. 05/03/21   Elgie Collard, PA-C  fluorouracil (EFUDEX) 5 % cream Apply 1 application topically daily as needed (sun damage). 11/16/20   [provider]  metoprolol tartrate (LOPRESSOR) 25 MG tablet Take 1 tablet (25 mg total) by mouth 2  (two) times daily. Patient not taking: Reported on 03/11/2022 05/21/21 05/21/22  Richardson Dopp T, PA-C   Allergies  Allergen Reactions   Bee Pollen Cough    Social History   Tobacco Use   Smoking status: Former    Types: Cigarettes   Smokeless tobacco: Never  Substance Use Topics   Alcohol use: Not Currently    Family History  Problem Relation Age of Onset   Diabetes Father    Heart disease Father    Colon cancer Neg Hx    Colon polyps Neg Hx    Esophageal cancer Neg Hx    Rectal cancer Neg Hx    Stomach cancer Neg Hx      Review of Systems  Positive ROS: neg  All other systems have been reviewed and were otherwise negative with the exception of those mentioned  in the HPI and as above.  Objective: Vital signs in last 24 hours: Temp:  [97.9 F (36.6 C)] 97.9 F (36.6 C) (08/30 0754) Pulse Rate:  [83] 83 (08/30 0754) Resp:  [18] 18 (08/30 0754) BP: (143)/(88) 143/88 (08/30 0754) SpO2:  [96 %] 96 % (08/30 0754) Weight:  [73.9 kg] 73.9 kg (08/30 0754)  General Appearance: Alert, cooperative, no distress, appears stated age [place]: Normocephalic, without obvious abnormality, atraumatic Eyes: PERRL, conjunctiva/corneas clear, EOM's intact    Neck: Supple, symmetrical, trachea midline Back: Symmetric, no curvature, ROM normal, no CVA tenderness Lungs:  respirations unlabored Heart: Regular rate and rhythm Abdomen: Soft, non-tender Extremities: Extremities normal, atraumatic, no cyanosis or edema Pulses: 2+ and symmetric all extremities Skin: Skin color, texture, turgor normal, no rashes or lesions  NEUROLOGIC:   Mental status: Alert and oriented x4,  no aphasia, good attention span, fund of knowledge, and memory Motor Exam - grossly normal Sensory Exam - grossly normal Reflexes: 1+ Coordination - grossly normal Gait - grossly normal Balance - grossly normal Cranial Nerves: I: smell Not tested  II: visual acuity  OS: nl    OD: nl  II: visual fields Full to  confrontation  II: pupils Equal, round, reactive to light  III,VII: ptosis None  III,IV,VI: extraocular muscles  Full ROM  V: mastication Normal  V: facial light touch sensation  Normal  V,VII: corneal reflex  Present  VII: facial muscle function - upper  Normal  VII: facial muscle function - lower Normal  VIII: hearing Not tested  IX: soft palate elevation  Normal  IX,X: gag reflex Present  XI: trapezius strength  5/5  XI: sternocleidomastoid strength 5/5  XI: neck flexion strength  5/5  XII: tongue strength  Normal    Data Review Lab Results  Component Value Date   WBC 4.6 03/13/2022   HGB 13.2 03/13/2022   HCT 41.9 03/13/2022   MCV 103.7 (H) 03/13/2022   PLT 157 03/13/2022   Lab Results  Component Value Date   NA 141 03/13/2022   K 3.8 03/13/2022   CL 108 03/13/2022   CO2 25 03/13/2022   BUN 19 03/13/2022   CREATININE 0.64 03/13/2022   GLUCOSE 98 03/13/2022   Lab Results  Component Value Date   INR 1.7 (H) 04/27/2021    Assessment/Plan:  Estimated body mass index is 24.07 kg/m as calculated from the following:   Height as of this encounter: '5\' 9"'$  (1.753 m).   Weight as of this encounter: 73.9 kg. Patient admitted for LL L1-2 L2-3 with diskectomy. Patient has failed a reasonable attempt at conservative therapy.  I explained the condition and procedure to the patient and answered any questions.  Patient wishes to proceed with procedure as planned. Understands risks/ benefits and typical outcomes of procedure.   Eustace Moore 03/13/2022 10:27 AM

## 2022-03-13 NOTE — Transfer of Care (Signed)
Immediate Anesthesia Transfer of Care Note  Patient: Nicholas Wise  Procedure(s) Performed: Lumbar One-Two, Lumbar Two-Three Decompressive lumbar laminectomy, medial facetectomy and foraminotomies with Right Lumbar One-Two Discetomy (Bilateral: Back)  Patient Location: PACU  Anesthesia Type:General  Level of Consciousness: awake and patient cooperative  Airway & Oxygen Therapy: Patient Spontanous Breathing  Post-op Assessment: Report given to RN, Post -op Vital signs reviewed and stable and Patient moving all extremities X 4  Post vital signs: Reviewed and stable  Last Vitals:  Vitals Value Taken Time  BP 120/69 03/13/22 1301  Temp    Pulse 82 03/13/22 1304  Resp 11 03/13/22 1304  SpO2 99 % 03/13/22 1304  Vitals shown include unvalidated device data.  Last Pain:  Vitals:   03/13/22 0854  TempSrc:   PainSc: 8       Patients Stated Pain Goal: 2 (23/30/07 6226)  Complications: No notable events documented.

## 2022-03-13 NOTE — Anesthesia Postprocedure Evaluation (Signed)
Anesthesia Post Note  Patient: Nicholas Wise  Procedure(s) Performed: Lumbar One-Two, Lumbar Two-Three Decompressive lumbar laminectomy, medial facetectomy and foraminotomies with Right Lumbar One-Two Discetomy (Bilateral: Back)     Patient location during evaluation: PACU Anesthesia Type: General Level of consciousness: awake and alert Pain management: pain level controlled Vital Signs Assessment: post-procedure vital signs reviewed and stable Respiratory status: spontaneous breathing, nonlabored ventilation and respiratory function stable Cardiovascular status: stable and blood pressure returned to baseline Anesthetic complications: no   No notable events documented.  Last Vitals:  Vitals:   03/13/22 1345 03/13/22 1400  BP: 125/70 125/68  Pulse: 77 74  Resp: 15 18  Temp: 36.6 C 36.6 C  SpO2: 97% 99%    Last Pain:  Vitals:   03/13/22 1345  TempSrc:   PainSc: 0-No pain                 Audry Pili

## 2022-03-13 NOTE — Op Note (Addendum)
03/13/2022  12:42 PM  PATIENT:  Nicholas Wise  76 y.o. male  PRE-OPERATIVE DIAGNOSIS: Lumbar spinal stenosis L1-2, L2 3 with right L1-2 disc herniation, back pain and right leg pain  POST-OPERATIVE DIAGNOSIS:  same  PROCEDURE: Decompressive lumbar laminectomy, medial facetectomy and foraminotomies L1-2 and L2-3 followed by discectomy L1-2 on the right  SURGEON:  Sherley Bounds, MD  ASSISTANTS: Glenford Peers FNP  ANESTHESIA:   General  EBL: Less than 50 ml  Total I/O In: 1000 [I.V.:1000] Out: 200 [Blood:200]  BLOOD ADMINISTERED: none  DRAINS: None  SPECIMEN:  none  INDICATION FOR PROCEDURE: This patient presented with back pain and right leg pain of about 4 months duration. Imaging showed multilevel spondylosis and degenerative disc disease with spinal stenosis at L1-2 and L2-3 within the right-sided disc herniation at L1-2 and an inferior free fragment. The patient tried conservative measures without relief. Pain was debilitating. Recommended decompressive laminectomy and microdiscectomy. Patient understood the risks, benefits, and alternatives and potential outcomes and wished to proceed.  PROCEDURE DETAILS: The patient was taken to the operating room and after induction of adequate generalized endotracheal anesthesia, the patient was rolled into the prone position on the Wilson frame and all pressure points were padded. The lumbar region was cleaned and then prepped with DuraPrep and draped in the usual sterile fashion. 5 cc of local anesthesia was injected and then a dorsal midline incision was made and carried down to the lumbo sacral fascia. The fascia was opened and the paraspinous musculature was taken down in a subperiosteal fashion to expose L1-2 and L2-3 bilaterally. Intraoperative x-ray confirmed my level, and then I moved the spinous process of L1 and L2 and used a combination of the high-speed drill and the Kerrison punches to perform a hemilaminectomy, medial facetectomy,  and foraminotomy at L1-2 and L2-3 bilaterally. The underlying yellow ligament was opened and removed in a piecemeal fashion to expose the underlying dura and exiting nerve root at both sides bilaterally. I undercut the lateral recess and dissected down until I was medial to and distal to the pedicle. The L2 and L3 nerve root was well decompressed bilaterally.  We further drilled up under the lateral recess at L1-2 on the right and found the L1 nerve root and skeletonized the pedicle.  We then retracted the dura medially and found a subannular disc herniation as well as an inferior free fragment in the shoulder of the nerve root.  We used a nerve hook to sweep disc fragments from under the nerve root distal to the disc space.  We found the annular tear in the bottom of the disc base and was able to squeeze more disc through that and then performed a thorough discectomy.  I then palpated with a coronary dilator along the nerve root and into the foramen to assure adequate decompression. I felt no more compression of the nerve root. I irrigated with saline solution containing bacitracin. Achieved hemostasis with bipolar cautery, lined the dura with Gelfoam, and then closed the fascia with 0 Vicryl. I closed the subcutaneous tissues with 2-0 Vicryl and the subcuticular tissues with 3-0 Vicryl. The skin was then closed with benzoin and Steri-Strips. The drapes were removed, a sterile dressing was applied.  My nurse practitioner was involved in the exposure, safe retraction of the neural elements, the disc work and the closure. the patient was awakened from general anesthesia and transferred to the recovery room in stable condition. At the end of the procedure all sponge, needle  and instrument counts were correct.    PLAN OF CARE: Admit for overnight observation  PATIENT DISPOSITION:  PACU - hemodynamically stable.   Delay start of Pharmacological VTE agent (>24hrs) due to surgical blood loss or risk of bleeding:   yes

## 2022-03-14 ENCOUNTER — Encounter (HOSPITAL_COMMUNITY): Payer: Self-pay | Admitting: Neurological Surgery

## 2022-03-14 MED FILL — Thrombin For Soln 5000 Unit: CUTANEOUS | Qty: 2 | Status: AC

## 2022-04-23 NOTE — Progress Notes (Signed)
Office Visit    Patient Name: Nicholas Wise Date of Encounter: 04/23/2022  Primary Care Provider:  Josem Kaufmann, MD Primary Cardiologist:  Nicholas Grooms, MD Primary Electrophysiologist: None  Chief Complaint    Nicholas Wise is a 76 y.o. 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  Past Medical History    Past Medical History:  Diagnosis Date   Allergy    Anxiety    Aortic stenosis    Arthritis    Blood transfusion without reported diagnosis    WITH cabg X 2 04/2021   Carotid artery stenosis    [place] 421-19%LICA, known 1417%RICA   Complication of anesthesia    bladder spasms after neck surgery. sent home with a catheter   Coronary artery disease    Depression    Heart murmur    Hemorrhoids    Hiatal hernia    High cholesterol    History of kidney stones    passed x 1   Hx of adenomatous polyp of colon 01/31/2017   Numbness and tingling in left hand    Stroke ([facility] 11/09/2013   [place]    TIA (transient ischemic attack)    no residual effects   Urinary frequency    Past Surgical History:  Procedure Laterality Date   ANTERIOR CERVICAL DECOMP/DISCECTOMY FUSION N/A 04/11/2014   Procedure: ANTERIOR CERVICAL DECOMPRESSION/DISCECTOMY FUSION CERVICAL THREE-FOUR,CERVICAL FOUR-FIVE,CERVICAL FIVE-SIX;  Surgeon: Nicholas Levine MD;  [facility];  Service: Neurosurgery;  Laterality: N/A;   ANTERIOR LAT LUMBAR FUSION Left 02/20/2018   Procedure: Left Lumbar Four-Five Anterolateral lumbar interbody fusion;  Surgeon: Nicholas Levine MD;  [place] [facility]  Service: Neurosurgery;  Laterality: Left;   AORTIC VALVE REPLACEMENT N/A  04/27/2021   Procedure: AORTIC VALVE REPLACEMENT (AVR) USING INSPIRIS 25MM AORTIC VALVE;  Surgeon: Nicholas Pollack MD;  [facility];  Service: Open Heart Surgery;  Laterality: N/A;   BACK SURGERY     COLONOSCOPY     CORONARY ARTERY BYPASS GRAFT N/A 04/27/2021   Procedure: CORONARY ARTERY BYPASS GRAFTING (CABG) X  3 , USING LEFT INTERNAL MAMMARY ARTERY AND RIGHT ENDOSCOPIC GREATER SAPHENOUS VEIN CONDUITS;  Surgeon: Nicholas Pollack MD;  [place] [facility]  Service: Open Heart Surgery;  Laterality: N/A;   ENDOVEIN HARVEST OF GREATER SAPHENOUS VEIN Right 04/27/2021   Procedure: ENDOVEIN HARVEST OF GREATER SAPHENOUS VEIN;  Surgeon: Nicholas Pollack MD;  [place] [facility]  Service: Open Heart Surgery;  Laterality: Right;   KNEE SURGERY Right    LUMBAR LAMINECTOMY/DECOMPRESSION MICRODISCECTOMY Bilateral 03/13/2022   Procedure: Lumbar One-Two, Lumbar Two-Three Decompressive lumbar laminectomy, medial facetectomy and foraminotomies with Right Lumbar One-Two Discetomy;  Surgeon: Nicholas Moore MD;  [facility] [facility]  Service: Neurosurgery;  Laterality: Bilateral;   LUMBAR PERCUTANEOUS PEDICLE SCREW 1 LEVEL N/A 02/20/2018   Procedure: Lumbar Percutaneous Pedicle Screw Placment Lumbar four-five;  Surgeon: Nicholas Levine MD;  [place] [facility]  Service: Neurosurgery;  Laterality: N/A;   POLYPECTOMY     RIGHT HEART CATH AND CORONARY ANGIOGRAPHY N/A 03/27/2021   Procedure: RIGHT HEART CATH AND CORONARY ANGIOGRAPHY;  Surgeon: Nicholas Booze MD;  [facility];  Service: Cardiovascular;  Laterality: N/A;   TEE WITHOUT CARDIOVERSION N/A 04/27/2021   Procedure: TRANSESOPHAGEAL ECHOCARDIOGRAM (TEE);  Surgeon: Nicholas Pollack, MD;  Location: [place];  Service: Open Heart Surgery;  Laterality: N/A;   TONSILLECTOMY     TOTAL KNEE ARTHROPLASTY Right 06/08/2019   Procedure: RIGHT TOTAL KNEE ARTHROPLASTY;  Surgeon: Nicholas Nakayama, MD;  [facility];  Service: Orthopedics;  Laterality: Right;    XI ROBOTIC ASSISTED INGUINAL HERNIA REPAIR WITH MESH Bilateral 11/21/2020   Procedure: ROBOTIC BILATERAL INGUINAL HERNIA REPAIR WITH MESH;  Surgeon: Nicholas Ok, MD;  Location: [place];  Service: General;  Laterality: Bilateral;    Allergies  Allergies  Allergen Reactions   Bee Pollen Cough    History of Present Illness    Nicholas Wise  is a 76 year old male with the above mention past medical history who presents today for 43-monthfollow-up of coronary artery disease.  [name] recently seen by Dr. VIrish Lackin 2019 for evaluation of heart murmur and preop clearance.  2D echo was completed with an EF of 60-65%, no RWMA, grade 1 DD, with moderate aortic stenosis.  Patient did well with his surgeries and subsequently had surveillance for aortic stenosis.  2D echo was completed in 2022 that revealed severe aortic stenosis.  Patient was also noted to have some lightheadedness and dyspnea on exertion.  Patient had work-up for replacement and R/LHC was performed on 03/27/2021 that revealed 90% mid RCA lesion, with 50% first diagonal and severe aortic stenosis.  Patient was referred to [facility] for CABG and AVR. He was last seen by Dr. VIrish Lackon 08/31/2021 for postprocedure follow-up.  Patient had normal postoperative course that was complicated with orthostatic hypotension.  He was given midodrine for BP control.  Patient was off midodrine during his visit with no medication changes made during visit.  He recently underwent lumbar microdiscectomy procedure that was successful.  Nicholas Wise today with his wife for follow-up visit.  Since last being seen in the office patient reports that he is doing much better and blood pressures are remained normal without any orthostatic concerns.  Today patient's blood pressures are 118/68 with heart rate of 84 bpm.  He is currently tolerating his medications without any adverse reactions or effects.  He has discontinued his metoprolol over the last few months.   We discussed the pathophysiology of carotid enterectomy and also orthostatic hypotension during our visit.  Is currently healing from microdiscectomy surgery that he states was successful.  Patient had all questions answered to his satisfaction.  Patient denies chest pain, palpitations, dyspnea, PND, orthopnea, nausea, vomiting, dizziness, syncope, edema, weight gain, or early satiety.   Home Medications    Current Outpatient Medications  Medication Sig Dispense Refill   acetaminophen (TYLENOL) 500 MG tablet Take 1,000 mg by mouth in the morning and at bedtime.     allopurinol (ZYLOPRIM) 300 MG tablet Take 300 mg by mouth in the morning.     ALPRAZolam (XANAX) 0.5 MG tablet Take 0.25 mg by mouth at bedtime.     aspirin EC 325 MG EC tablet Take 1 tablet (325 mg total) by mouth daily. (Patient taking differently: Take 325 mg by mouth at bedtime.) 30 tablet 0   atorvastatin (LIPITOR) 40 MG tablet Take 40 mg by mouth every evening.      fexofenadine (ALLEGRA) 180 MG tablet Take 180 mg by mouth daily.     finasteride (PROSCAR) 5 MG tablet Take 5  mg by mouth daily.     fluorouracil (EFUDEX) 5 % cream Apply 1 application topically daily as needed (sun damage).     lidocaine 4 % Place 1 patch onto the skin daily.     metoprolol tartrate (LOPRESSOR) 25 MG tablet Take 1 tablet (25 mg total) by mouth 2 (two) times daily. (Patient not taking: Reported on 03/11/2022) 180 tablet 3   montelukast (SINGULAIR) 10 MG tablet Take 10 mg by mouth at bedtime.     Polyethyl Glycol-Propyl Glycol (SYSTANE OP) Place 1 drop into both eyes in the morning and at bedtime.     potassium citrate (UROCIT-K) 10 MEQ (1080 MG) SR tablet Take 10 mEq by mouth 2 (two) times daily.     Saline (SIMPLY SALINE) 0.9 % AERS Place 1 spray into the nose in the morning and at bedtime.     senna-docusate (SENOKOT-S) 8.6-50 MG tablet Take 2 tablets by mouth at bedtime.     sertraline (ZOLOFT) 100 MG tablet Take 100 mg by mouth at bedtime.      tamsulosin (FLOMAX) 0.4 MG CAPS capsule Take 0.4 mg by mouth 2 (two) times daily.     triamcinolone (NASACORT) 55 MCG/ACT AERO nasal inhaler Place 1 spray into the nose in the morning.     zinc gluconate 50 MG tablet Take 50 mg by mouth daily.     No current facility-administered medications for this visit.     Review of Systems  Please see the history of present illness.    (+) Postsurgical back pain  All other systems reviewed and are otherwise negative except as noted above.  Physical Exam    Wt Readings from Last 3 Encounters:  03/13/22 163 lb (73.9 kg)  02/11/22 164 lb (74.4 kg)  01/14/22 164 lb (74.4 kg)   VO:ZDGUY were no vitals filed for this visit.,There is no height or weight on file to calculate BMI.  Constitutional:      Appearance: Healthy appearance. Not in distress.  Neck:     Vascular: JVD normal.  Pulmonary:     Effort: Pulmonary effort is normal.     Breath sounds: No wheezing. No rales. Diminished in the bases Cardiovascular:     Normal rate. Regular rhythm. Normal S1. Normal S2.      Murmurs: There is no murmur.  Edema:    Peripheral edema absent.  Abdominal:     Palpations: Abdomen is soft non tender. There is no hepatomegaly.  Skin:    General: Skin is warm and dry.  Neurological:     General: No focal deficit present.     Mental Status: Alert and oriented to person, place and time.     Cranial Nerves: Cranial nerves are intact.  EKG/LABS/Other Studies Reviewed    ECG personally reviewed by me today -sinus rhythm with RBBB and rate of 84 bpm with no acute changes noted and TWI in leads III similar to previous EKG.  Lab Results  Component Value Date   WBC 4.6 03/13/2022   HGB 13.2 03/13/2022   HCT 41.9 03/13/2022   MCV 103.7 (H) 03/13/2022   PLT 157 03/13/2022   Lab Results  Component Value Date   CREATININE 0.64 03/13/2022   BUN 19 03/13/2022   NA 141 03/13/2022   K 3.8 03/13/2022   CL 108 03/13/2022   CO2 25 03/13/2022   Lab  Results  Component Value Date   ALT 19 04/25/2021   AST 28 04/25/2021   ALKPHOS 63 04/25/2021  BILITOT 0.6 04/25/2021   Lab Results  Component Value Date   CHOL 86 04/29/2021   HDL 21 (L) 04/29/2021   LDLCALC 44 04/29/2021   TRIG 107 04/29/2021   CHOLHDL 4.1 04/29/2021    Lab Results  Component Value Date   HGBA1C 5.7 (H) 04/25/2021    Assessment & Plan    1. CAD and Aortic Stenosis: -s/p CABG and AVR repair with bioprosthetic valve. -Today Nicholas Wise reports that he is feeling well with no complaints of shortness of breath or chest pain. -SBE prophylaxis was discussed and advised for dental procedures -Continue GDMT with atorvastatin 40 mg and metoprolol to tartrate 25 mg once daily  2.  Hyperlipidemia: -Patient LDLs were 44 on 04/2021 -Continue atorvastatin 40 mg  3.  Aortic root dilation: -Last 2D echo completed 05/2021 with dilation of 40 mm -Patient will need surveillance echo or MRI in the future  4.  Carotid artery disease: -[place] 10/22: R ICA 100; L ICA 60-79 -Continue GDMT with ASA 81 mg and atorvastatin 40 mg -Patient will have surveillance carotid Doppler completed in May 2024   Disposition: Follow-up with Nicholas Grooms, MD or APP in 6 months    Medication Adjustments/Labs and Tests Ordered: Current medicines are reviewed at length with the patient today.  Concerns regarding medicines are outlined above.   Signed, Mable Fill, Marissa Nestle, NP 04/23/2022, 7:05 PM [facility]  Note:  This document was prepared using Dragon voice recognition software and may include unintentional dictation errors.

## 2022-04-25 ENCOUNTER — Ambulatory Visit: Payer: Medicare Other | Attending: Interventional Cardiology | Admitting: Nurse Practitioner

## 2022-04-25 ENCOUNTER — Encounter: Payer: Self-pay | Admitting: Nurse Practitioner

## 2022-04-25 VITALS — BP 118/68 | HR 84 | Ht 69.0 in | Wt 168.0 lb

## 2022-04-25 DIAGNOSIS — I6521 Occlusion and stenosis of right carotid artery: Secondary | ICD-10-CM

## 2022-04-25 DIAGNOSIS — I77810 Thoracic aortic ectasia: Secondary | ICD-10-CM | POA: Insufficient documentation

## 2022-04-25 DIAGNOSIS — Z953 Presence of xenogenic heart valve: Secondary | ICD-10-CM | POA: Diagnosis present

## 2022-04-25 DIAGNOSIS — E782 Mixed hyperlipidemia: Secondary | ICD-10-CM | POA: Insufficient documentation

## 2022-04-25 DIAGNOSIS — Z951 Presence of aortocoronary bypass graft: Secondary | ICD-10-CM

## 2022-04-25 MED ORDER — METOPROLOL TARTRATE 25 MG PO TABS: 25.0000 mg | ORAL_TABLET | Freq: Every day | ORAL | 3 refills | Status: DC

## 2022-04-25 NOTE — Patient Instructions (Signed)
Medication Instructions:  Restart Metoprolol Tartrate 25 mg daily   *If you need a refill on your cardiac medications before your next appointment, please call your pharmacy*   Lab Work: None ordered   If you have labs (blood work) drawn today and your tests are completely normal, you will receive your results only by: [place] (if you have MyChart) OR A paper copy in the mail If you have any lab test that is abnormal or we need to change your treatment, we will call you to review the results.   Testing/Procedures: None ordered    Follow-Up: At [facility], you and your health needs are our priority.  As part of our continuing mission to provide you with exceptional heart care, we have created designated Provider Care Teams.  These Care Teams include your primary Cardiologist (physician) and Advanced Practice Providers (APPs -  Physician Assistants and Nurse Practitioners) who all work together to provide you with the care you need, when you need it.  We recommend signing up for the patient portal called "MyChart".  Sign up information is provided on this After Visit Summary.  MyChart is used to connect with patients for Virtual Visits (Telemedicine).  Patients are able to view lab/test results, encounter notes, upcoming appointments, etc.  Non-urgent messages can be sent to your provider as well.   To learn more about what you can do with MyChart, go to [email or web address].    Your next appointment:   6 month(s)  The format for your next appointment:   In Person  Provider:   Larae Grooms, MD     Other Instructions   Important Information About Sugar

## 2022-04-26 ENCOUNTER — Ambulatory Visit: Payer: Medicare Other | Admitting: Interventional Cardiology

## 2022-04-26 NOTE — Addendum Note (Signed)
Addended by: Janan Halter F on: 04/26/2022 04:09 PM   Modules accepted: Orders

## 2022-06-29 IMAGING — CR DG CHEST 2V
2 series · 2 of 2 positions shown · non-contrast
Comparison: 06/04/2019

CLINICAL DATA: 74-year-old male with preoperative chest x-ray

EXAM:
CHEST - 2 VIEW

[w chest pa]
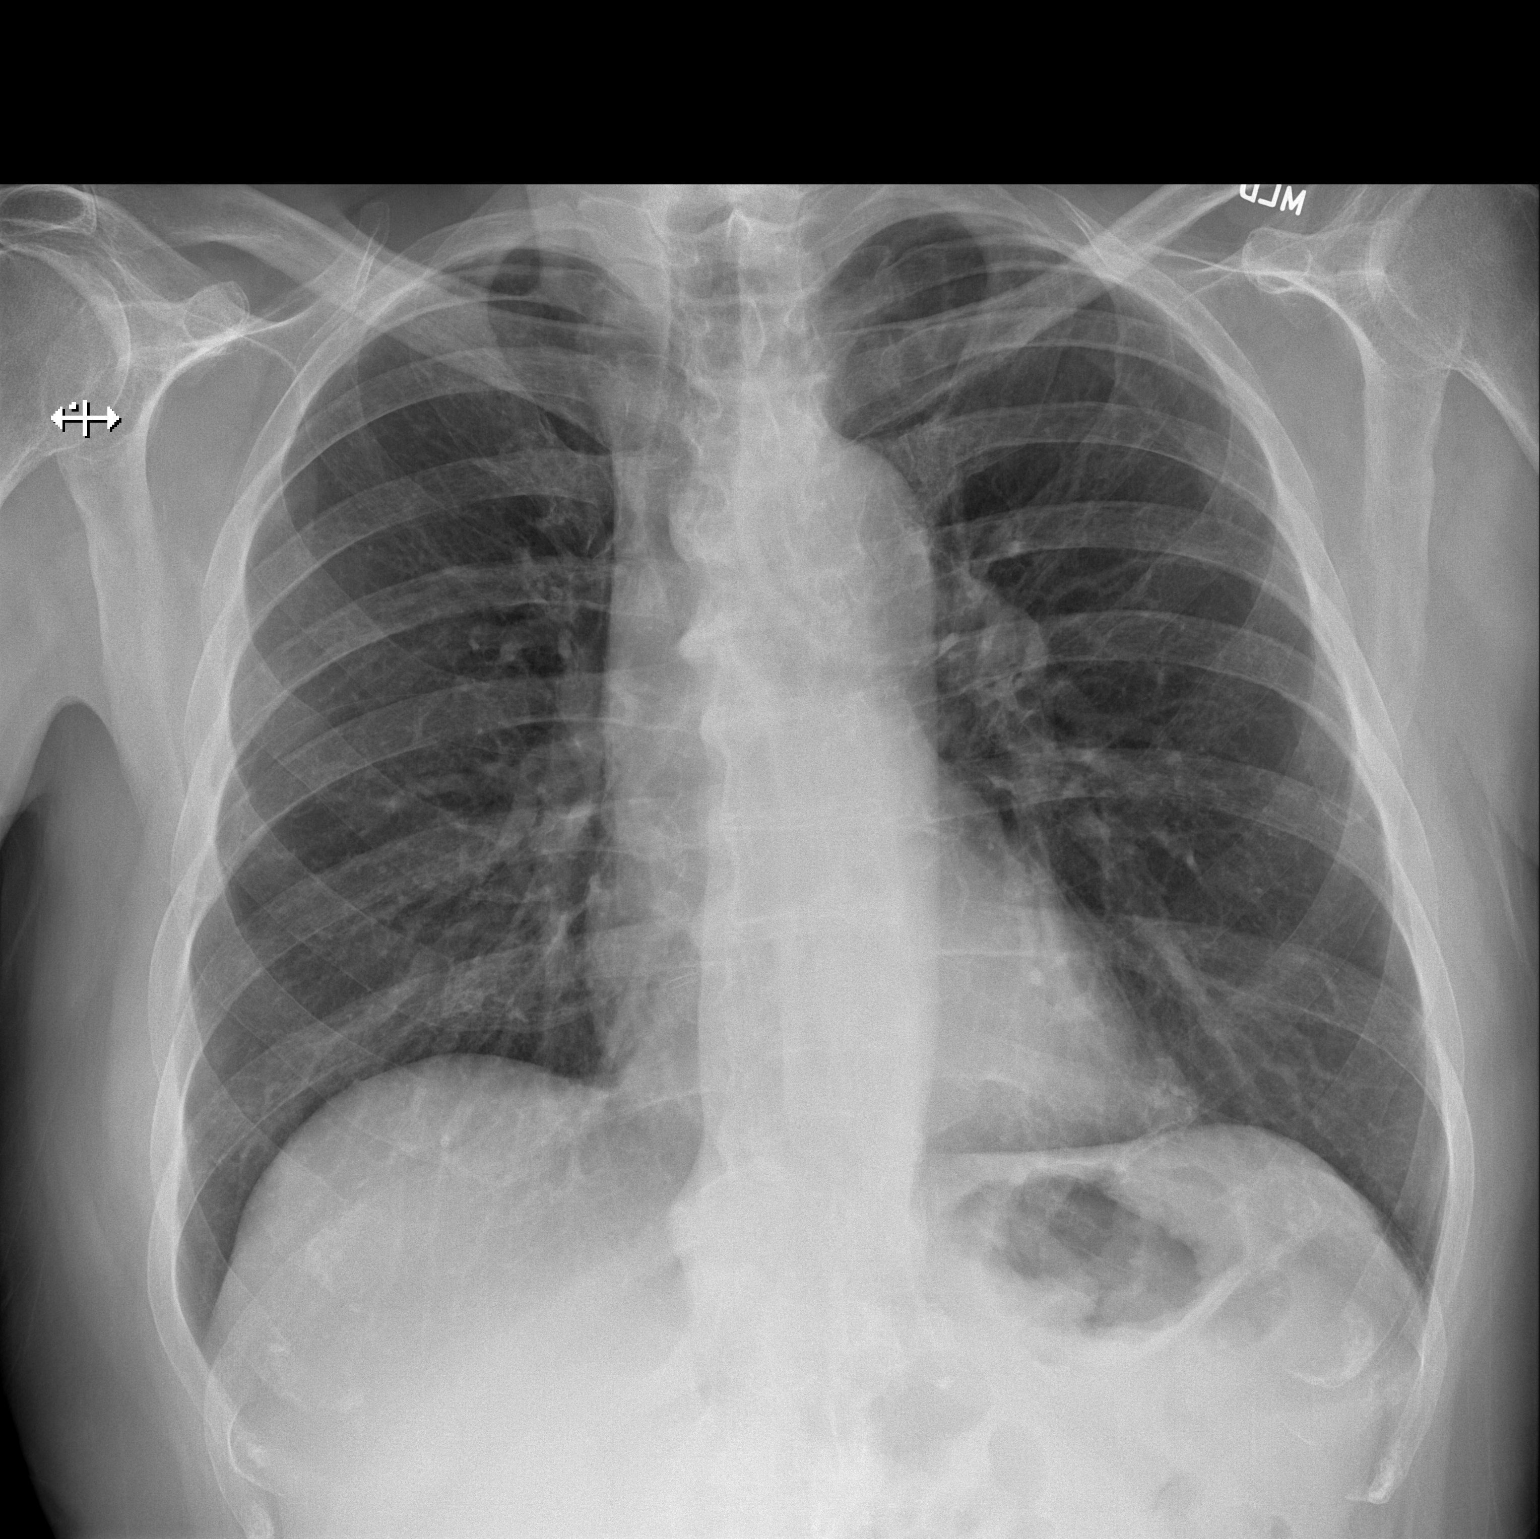

[w chest lat]
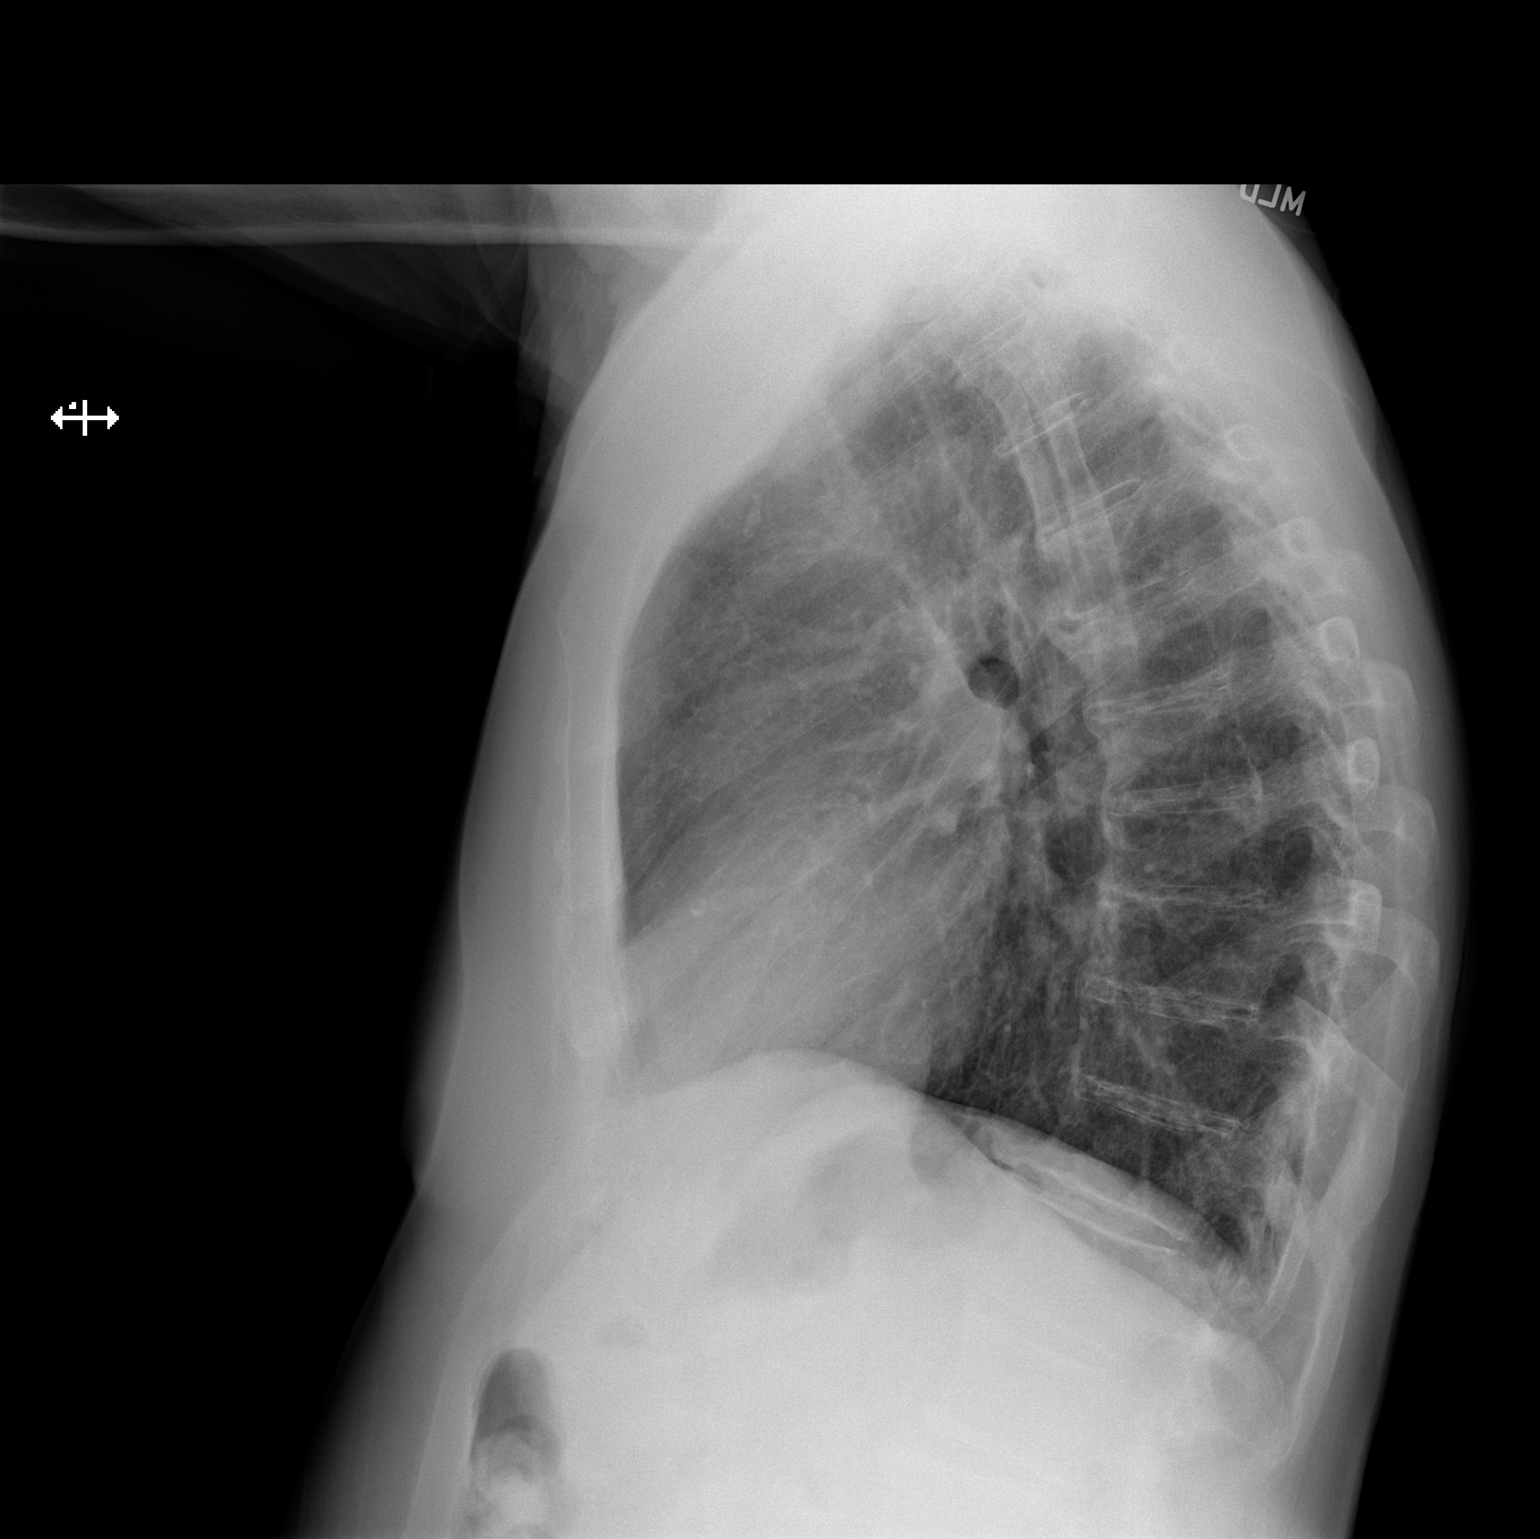

[2 of 2 positions shown; findings below may reference images not displayed]

FINDINGS: Cardiomediastinal silhouette unchanged in size and contour. No
evidence of central vascular congestion. No interlobular septal
thickening.

No pneumothorax or pleural effusion. Coarsened interstitial
markings, with no confluent airspace disease.

No acute displaced fracture. Degenerative changes of the spine.

Surgical changes of the cervical region.
IMPRESSION: Negative for acute cardiopulmonary disease

## 2022-07-01 IMAGING — DX DG CHEST 1V PORT
1 series · 1 of 1 positions shown · non-contrast
Comparison: Portable exam 6666 hours compared to 04/25/2021

CLINICAL DATA: Pneumothorax

EXAM:
PORTABLE CHEST 1 VIEW

[chest ap]
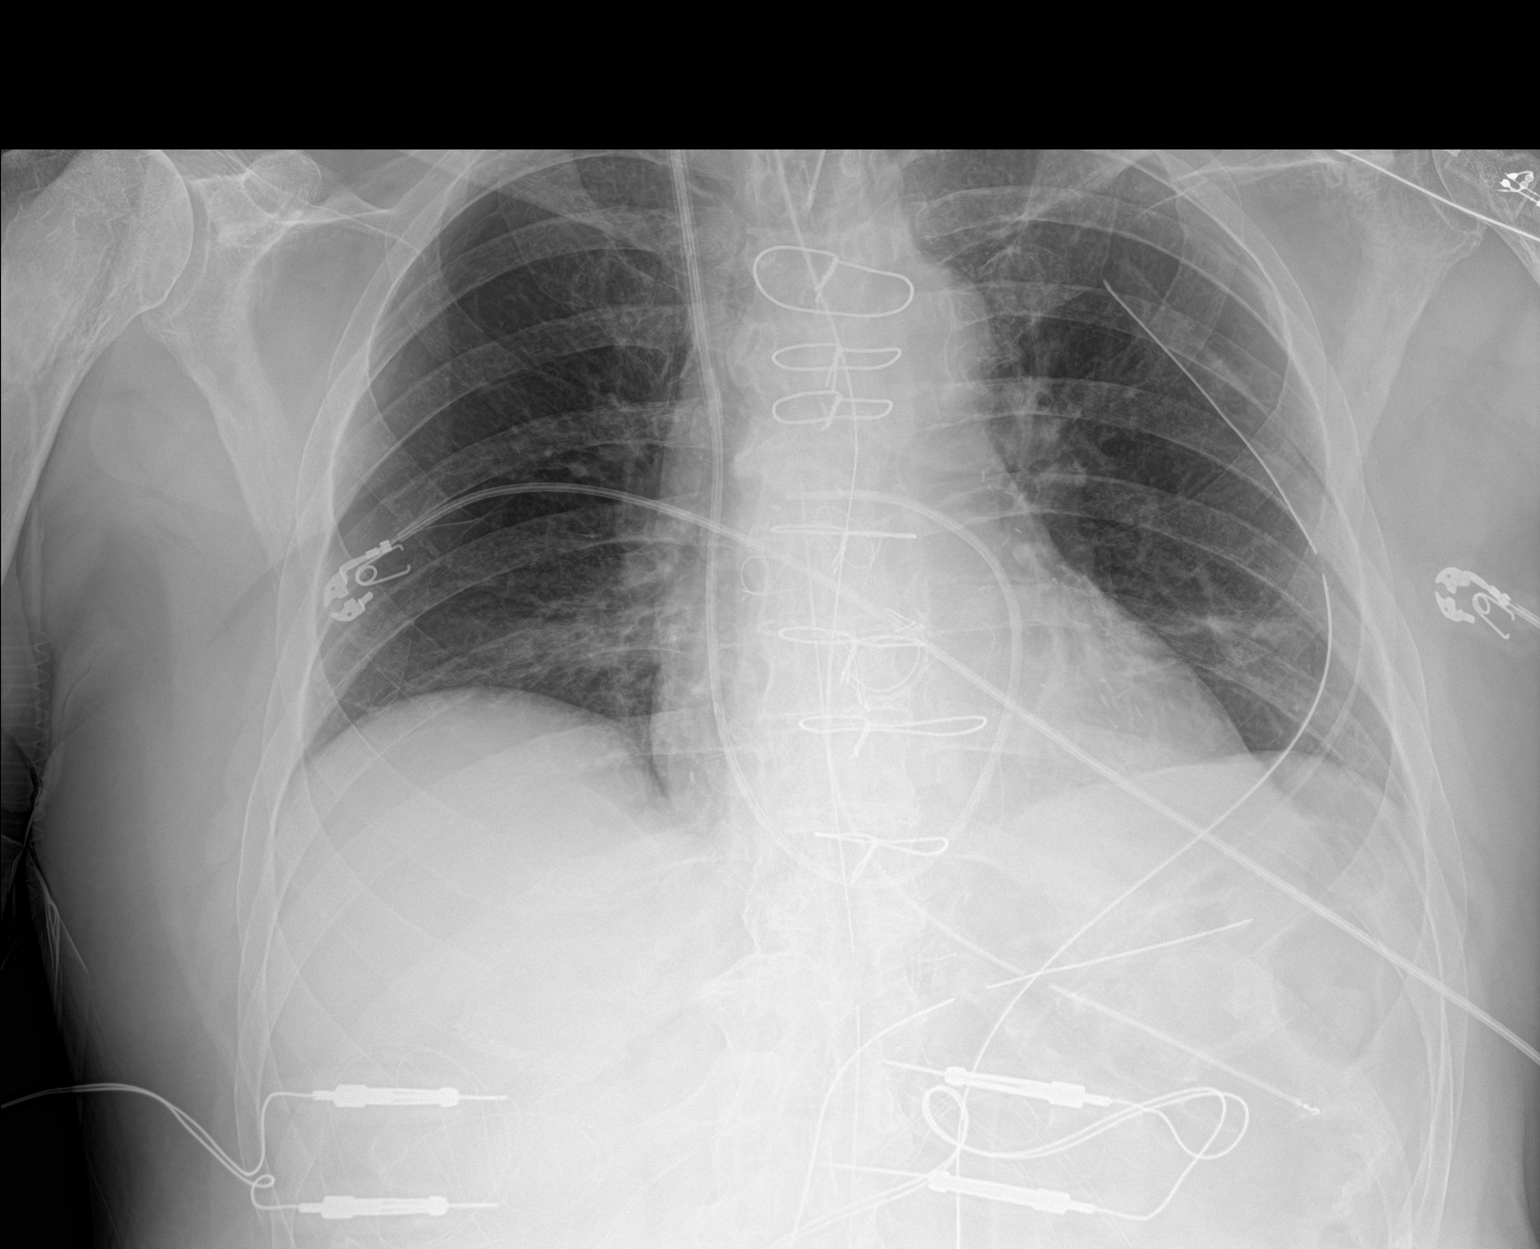

[1 of 1 positions shown; findings below may reference images not displayed]

FINDINGS: Tip of endotracheal tube projects 5.8 cm above carina.

Nasogastric tube extends into stomach.

RIGHT jugular Swan-Ganz catheter with tip projecting over RIGHT
pulmonary artery near hilum.

Mediastinal drains and LEFT thoracostomy tube present.

Normal heart size with postsurgical changes of CABG and AVR.

Epicardial pacing wires noted.

Small LEFT apex pneumothorax.

Additional small RIGHT apex pneumothorax identified as well.

Minimal bibasilar atelectasis.
IMPRESSION: Interval postsurgical changes with bibasilar atelectasis.

BILATERAL small pneumothoraces larger on RIGHT; patient has a LEFT
thoracostomy tube but not a RIGHT thoracostomy tube.

Critical Value/emergent results were called by telephone at the time
of interpretation on 04/27/2021 at [DATE] to [REDACTED], who
verbally acknowledged these results.

## 2022-07-02 IMAGING — DX DG CHEST 1V PORT
1 series · 1 of 1 positions shown · non-contrast
Comparison: 1 day prior

CLINICAL DATA: Shortness of breath

EXAM:
PORTABLE CHEST 1 VIEW

[chest]
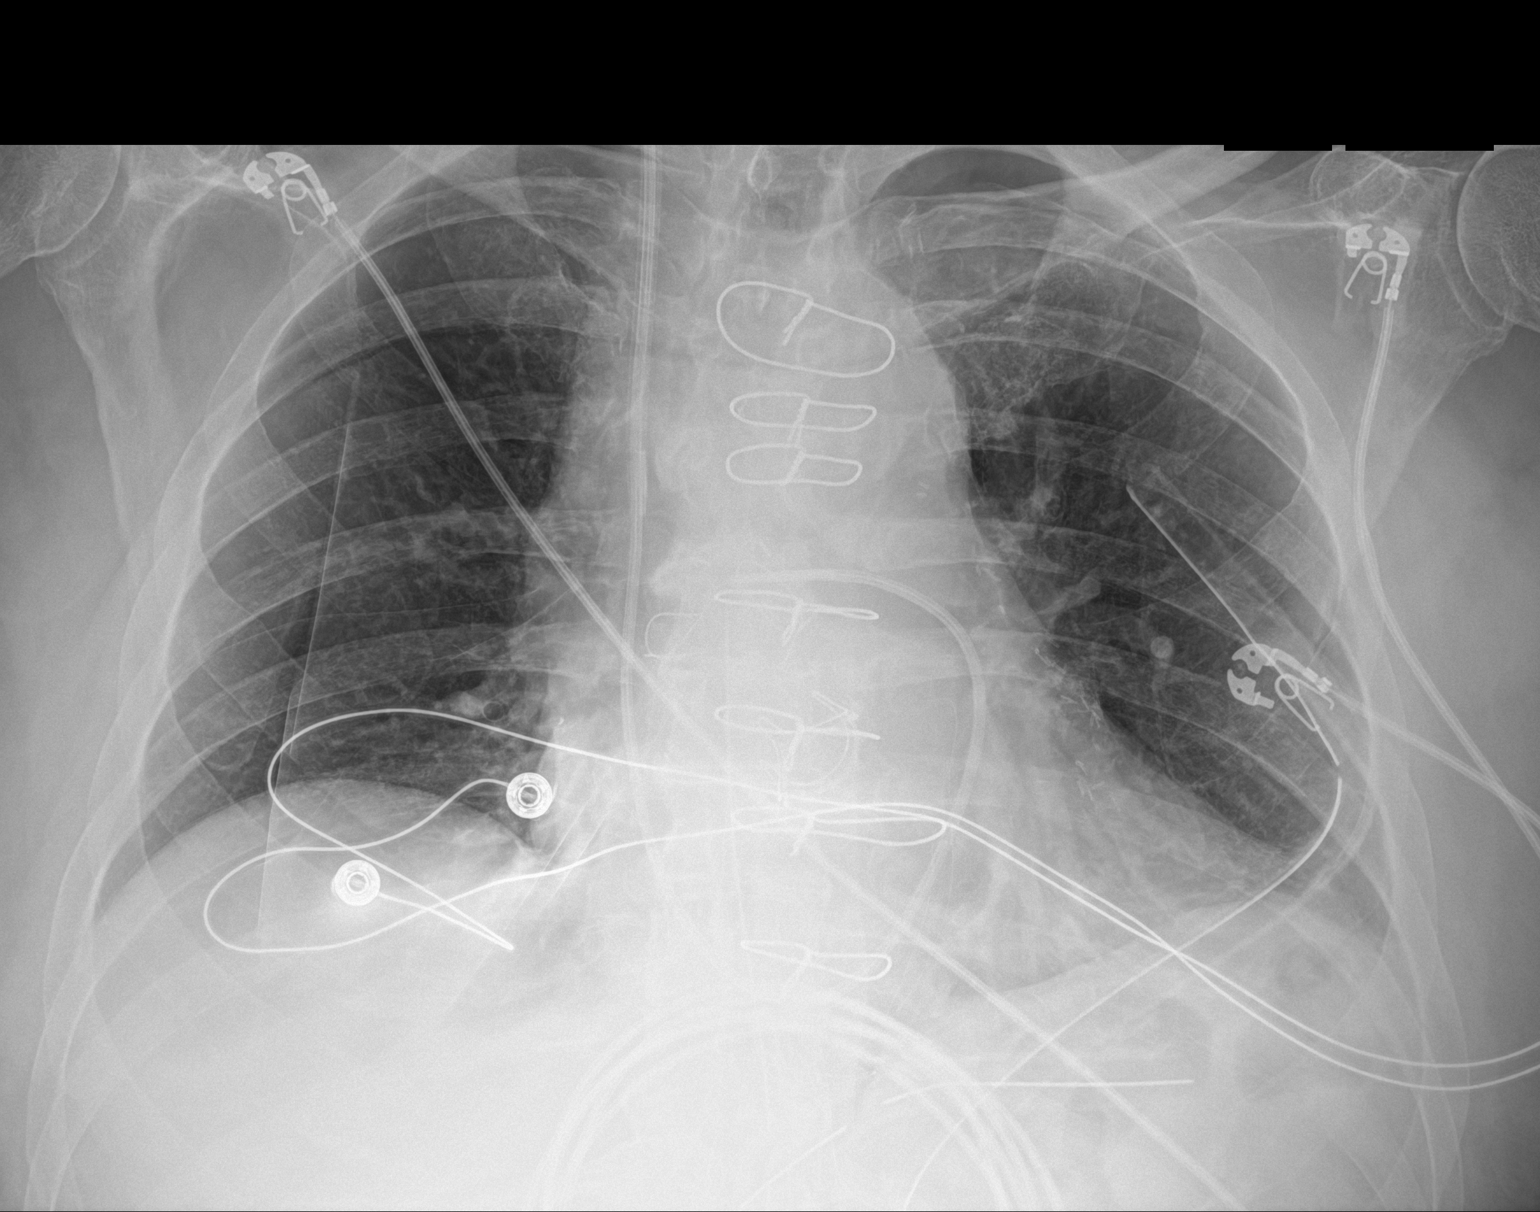

[1 of 1 positions shown; findings below may reference images not displayed]

FINDINGS: Extubation. Removal of nasogastric tube. Right IJ Swan-Ganz catheter
tip at outflow tract. Mediastinal drain and left-sided chest tubes.
Midline trachea. Mild cardiomegaly. Prior median sternotomy. No
pleural fluid. 10% left apical pneumothorax is increased, with
visceral pleural line 1.6 cm from chest wall.

Improvement in right apical pneumothorax, between 5 and 10%.
Visceral pleural line 1.0 cm from chest wall. Mild left base
subsegmental atelectasis. Improved right base aeration.
IMPRESSION: Increased left and decreased right small apical pneumothoraces, with
left-sided chest tubes in place.

Extubation and removal of nasogastric tube.

## 2022-07-31 LAB — LAB REPORT - SCANNED: EGFR: 90

## 2022-10-15 NOTE — Progress Notes (Unsigned)
Cardiology Office Note   Date:  10/16/2022   ID:  Nicholas Wise, DOB 1945/09/28, MRN [id number]  PCP:  Josem Kaufmann, MD    No chief complaint on file.  CAD  Wt Readings from Last 3 Encounters:  10/16/22 162 lb (73.5 kg)  04/25/22 168 lb (76.2 kg)  03/13/22 163 lb (73.9 kg)       History of Present Illness: Nicholas Wise is a 77 y.o. male  with:  Aortic stenosis Coronary artery disease S/p CABG + bioprosthetic AVR 04/2021 Carotid artery disease [place] 10/22: R ICA 100; L ICA 60-79 History of TIA in 2015 History of multiple back surgeries S/p R TKR in 05/2019 Hyperlipidemia Dilated ascending aorta Echo 6/22: 40 mm   Records show: He had "worsening aortic stenosis now in the severe range.  He was referred for cardiac catheterization which demonstrated three-vessel CAD in 2022.  He was referred to cardiothoracic surgery and underwent CABG x3 (LIMA-LAD, SVG-Dx, SVG-PDA) and bioprosthetic aortic valve replacement with Dr. Cyndia Bent.  He was admitted 10/14-10/20.  His postoperative course was notable for orthostatic hypotension, right and left pneumothoraces, anemia requiring transfusion and thrombocytopenia.  He was briefly placed on Midodrine to control his low blood pressures.  Chest tube placement was attempted on the right but this was unsuccessful.  His pneumothoraces were followed conservatively with serial chest x-rays.  His platelet count eventually improved.  "    Walking and working in the yard.    Denies : Chest pain. Dizziness. Leg edema. Nitroglycerin use. Orthopnea. Palpitations. Paroxysmal nocturnal dyspnea. Shortness of breath. Syncope.    Not much sustained walking  Past Medical History:  Diagnosis Date   Allergy    Anxiety    Aortic stenosis    Arthritis    Blood transfusion without reported diagnosis    WITH cabg X 2 04/2021   Carotid artery stenosis    [id number] [place]: [id number] LICA, known [id number] RICA   Complication of anesthesia    bladder spasms after neck  surgery. sent home with a catheter   Coronary artery disease    Depression    Heart murmur    Hemorrhoids    Hiatal hernia    High cholesterol    History of kidney stones    passed x 1   Hx of adenomatous polyp of colon 01/31/2017   Numbness and tingling in left hand    Stroke 11/09/2013   Tia    TIA (transient ischemic attack)    no residual effects   Urinary frequency     Past Surgical History:  Procedure Laterality Date   ANTERIOR CERVICAL DECOMP/DISCECTOMY FUSION N/A 04/11/2014   Procedure: ANTERIOR CERVICAL DECOMPRESSION/DISCECTOMY FUSION CERVICAL THREE-FOUR,CERVICAL FOUR-FIVE,CERVICAL FIVE-SIX;  Surgeon: Erline Levine, MD;  Location: [facility];  Service: Neurosurgery;  Laterality: N/A;   ANTERIOR LAT LUMBAR FUSION Left 02/20/2018   Procedure: Left Lumbar Four-Five Anterolateral lumbar interbody fusion;  Surgeon: Erline Levine, MD;  Location: [facility];  Service: Neurosurgery;  Laterality: Left;   AORTIC VALVE REPLACEMENT N/A 04/27/2021   Procedure: AORTIC VALVE REPLACEMENT (AVR) USING INSPIRIS 25MM AORTIC VALVE;  Surgeon: Gaye Pollack, MD;  Location: [facility];  Service: Open Heart Surgery;  Laterality: N/A;   BACK SURGERY     COLONOSCOPY     CORONARY ARTERY BYPASS GRAFT N/A 04/27/2021   Procedure: CORONARY ARTERY BYPASS GRAFTING (CABG) X  3 , USING LEFT INTERNAL MAMMARY ARTERY AND RIGHT ENDOSCOPIC GREATER SAPHENOUS VEIN CONDUITS;  Surgeon: Cyndia Bent,  Fernande Boyden, MD;  Location: [facility];  Service: Open Heart Surgery;  Laterality: N/A;   ENDOVEIN HARVEST OF GREATER SAPHENOUS VEIN Right 04/27/2021   Procedure: ENDOVEIN HARVEST OF GREATER SAPHENOUS VEIN;  Surgeon: Gaye Pollack, MD;  Location: [facility];  Service: Open Heart Surgery;  Laterality: Right;   KNEE SURGERY Right    LUMBAR LAMINECTOMY/DECOMPRESSION MICRODISCECTOMY Bilateral 03/13/2022   Procedure: Lumbar One-Two, Lumbar Two-Three Decompressive lumbar laminectomy, medial facetectomy and foraminotomies with Right Lumbar One-Two  Discetomy;  Surgeon: Eustace Moore, MD;  Location: [facility];  Service: Neurosurgery;  Laterality: Bilateral;   LUMBAR PERCUTANEOUS PEDICLE SCREW 1 LEVEL N/A 02/20/2018   Procedure: Lumbar Percutaneous Pedicle Screw Placment Lumbar four-five;  Surgeon: Erline Levine, MD;  Location: [facility];  Service: Neurosurgery;  Laterality: N/A;   POLYPECTOMY     RIGHT HEART CATH AND CORONARY ANGIOGRAPHY N/A 03/27/2021   Procedure: RIGHT HEART CATH AND CORONARY ANGIOGRAPHY;  Surgeon: Jettie Booze, MD;  Location: [facility];  Service: Cardiovascular;  Laterality: N/A;   TEE WITHOUT CARDIOVERSION N/A 04/27/2021   Procedure: TRANSESOPHAGEAL ECHOCARDIOGRAM (TEE);  Surgeon: Gaye Pollack, MD;  Location: [facility];  Service: Open Heart Surgery;  Laterality: N/A;   TONSILLECTOMY     TOTAL KNEE ARTHROPLASTY Right 06/08/2019   Procedure: RIGHT TOTAL KNEE ARTHROPLASTY;  Surgeon: Melrose Nakayama, MD;  Location: [facility];  Service: Orthopedics;  Laterality: Right;   XI ROBOTIC ASSISTED INGUINAL HERNIA REPAIR WITH MESH Bilateral 11/21/2020   Procedure: ROBOTIC BILATERAL INGUINAL HERNIA REPAIR WITH MESH;  Surgeon: Ralene Ok, MD;  Location: [facility];  Service: General;  Laterality: Bilateral;     Current Outpatient Medications  Medication Sig Dispense Refill   acetaminophen (TYLENOL) 500 MG tablet Take 1,000 mg by mouth in the morning and at bedtime.     allopurinol (ZYLOPRIM) 300 MG tablet Take 300 mg by mouth in the morning.     ALPRAZolam (XANAX) 0.5 MG tablet Take 0.25 mg by mouth at bedtime.     amoxicillin (AMOXIL) 500 MG capsule SMARTSIG:4 Capsule(s) By Mouth Once     aspirin EC 325 MG EC tablet Take 1 tablet (325 mg total) by mouth daily. (Patient taking differently: Take 325 mg by mouth at bedtime.) 30 tablet 0   atorvastatin (LIPITOR) 40 MG tablet Take 40 mg by mouth every evening.      finasteride (PROSCAR) 5 MG tablet Take 5 mg by mouth daily.     fluorouracil (EFUDEX) 5 % cream Apply 1  application topically daily as needed (sun damage).     metoprolol tartrate (LOPRESSOR) 25 MG tablet Take 1 tablet (25 mg total) by mouth daily. 90 tablet 3   montelukast (SINGULAIR) 10 MG tablet Take 10 mg by mouth at bedtime.     Polyethyl Glycol-Propyl Glycol (SYSTANE OP) Place 1 drop into both eyes in the morning and at bedtime.     potassium citrate (UROCIT-K) 10 MEQ (1080 MG) SR tablet Take 10 mEq by mouth 2 (two) times daily.     senna-docusate (SENOKOT-S) 8.6-50 MG tablet Take 2 tablets by mouth at bedtime.     sertraline (ZOLOFT) 100 MG tablet Take 100 mg by mouth at bedtime.     tamsulosin (FLOMAX) 0.4 MG CAPS capsule Take 0.4 mg by mouth 2 (two) times daily.     zinc gluconate 50 MG tablet Take 50 mg by mouth daily.     triamcinolone (NASACORT) 55 MCG/ACT AERO nasal inhaler Place 1 spray into the nose  in the morning. (Patient not taking: Reported on 10/16/2022)     No current facility-administered medications for this visit.    Allergies:   Bee pollen    Social History:  The patient  reports that he has quit smoking. His smoking use included cigarettes. He has never used smokeless tobacco. He reports that he does not currently use alcohol. He reports that he does not use drugs.   Family History:  The patient's family history includes Diabetes in his father; Heart disease in his father.    ROS:  Please see the history of present illness.   Otherwise, review of systems are positive for less activity.   All other systems are reviewed and negative.    PHYSICAL EXAM: VS:  BP (!) 152/72   Pulse 66   Ht 5\' 9"  (1.753 m)   Wt 162 lb (73.5 kg)   SpO2 96%   BMI 23.92 kg/m  , BMI Body mass index is 23.92 kg/m. GEN: Well nourished, well developed, in no acute distress HEENT: normal Neck: no JVD, carotid bruits, or masses Cardiac: RRR; 1/6 early systolic murmur, no rubs, or gallops,no edema  Respiratory:  clear to auscultation bilaterally, normal work of breathing GI: soft,  nontender, nondistended, + BS MS: no deformity or atrophy Skin: warm and dry, no rash Neuro:  Strength and sensation are intact Psych: euthymic mood, full affect   EKG:   The ekg ordered 10/23 demonstrates NSR, RBBB, no ST changes   Recent Labs: 03/13/2022: BUN 19; Creatinine, Ser 0.64; Hemoglobin 13.2; Platelets 157; Potassium 3.8; Sodium 141   Lipid Panel    Component Value Date/Time   CHOL 86 04/29/2021 0445   TRIG 107 04/29/2021 0445   HDL 21 (L) 04/29/2021 0445   CHOLHDL 4.1 04/29/2021 0445   VLDL 21 04/29/2021 0445   LDLCALC 44 04/29/2021 0445     Other studies Reviewed: Additional studies/ records that were reviewed today with results demonstrating: labs reviewed; Hbg normal in 8/23.   ASSESSMENT AND PLAN:  CAD: no angina.  Continue aggressive secondary prevention.  Walking has decreased due to hip pain.  No falls.  Status post AVR: SBE prophylaxis. Uses amoxicillin.  Hyperlipidemia: January 2024 total cholesterol 142 triglycerides 187 HDL 33 LDL 72. COntinue atorvastatin.  PAD: Occlusion of right carotid.  Left carotid moderate disease.  Needs regular duplex.  Repeat duplex in May 2024. PACs: No symptoms.  Was on metoprolol BID but this was dropped to once a day.   Home BP readings have been well controlled.  110-130s/60s Had been on iron for anemia in the past.  Hbg normalized in 2023.   Current medicines are reviewed at length with the patient today.  The patient concerns regarding his medicines were addressed.  The following changes have been made:  No change  Labs/ tests ordered today include:   Orders Placed This Encounter  Procedures   VAS US CAROTID    Recommend 150 minutes/week of aerobic exercise Low fat, low carb, high fiber diet recommended  Disposition:   FU in 1 year    Signed, Larae Grooms, MD  10/16/2022 3:25 PM    [facility] [address], [place], [place]  [address] Phone: [phone]; Fax: [phone]

## 2022-10-16 ENCOUNTER — Ambulatory Visit: Payer: Medicare Other | Attending: Interventional Cardiology | Admitting: Interventional Cardiology

## 2022-10-16 ENCOUNTER — Encounter: Payer: Self-pay | Admitting: Interventional Cardiology

## 2022-10-16 VITALS — BP 152/72 | HR 66 | Ht 69.0 in | Wt 162.0 lb

## 2022-10-16 DIAGNOSIS — Z953 Presence of xenogenic heart valve: Secondary | ICD-10-CM | POA: Insufficient documentation

## 2022-10-16 DIAGNOSIS — E782 Mixed hyperlipidemia: Secondary | ICD-10-CM | POA: Diagnosis present

## 2022-10-16 DIAGNOSIS — I6521 Occlusion and stenosis of right carotid artery: Secondary | ICD-10-CM | POA: Diagnosis present

## 2022-10-16 DIAGNOSIS — Z951 Presence of aortocoronary bypass graft: Secondary | ICD-10-CM | POA: Insufficient documentation

## 2022-10-16 DIAGNOSIS — I77810 Thoracic aortic ectasia: Secondary | ICD-10-CM | POA: Insufficient documentation

## 2022-10-16 DIAGNOSIS — I25118 Atherosclerotic heart disease of native coronary artery with other forms of angina pectoris: Secondary | ICD-10-CM | POA: Diagnosis present

## 2022-10-16 NOTE — Patient Instructions (Signed)
Medication Instructions:  Your physician recommends that you continue on your current medications as directed. Please refer to the Current Medication list given to you today.  *If you need a refill on your cardiac medications before your next appointment, please call your pharmacy*   Lab Work: none If you have labs (blood work) drawn today and your tests are completely normal, you will receive your results only by: [place] (if you have MyChart) OR A paper copy in the mail If you have any lab test that is abnormal or we need to change your treatment, we will call you to review the results.   Testing/Procedures: Your physician has requested that you have a carotid duplex. This test is an ultrasound of the carotid arteries in your neck. It looks at blood flow through these arteries that supply the brain with blood. Allow one hour for this exam. There are no restrictions or special instructions. To be done in [facility] May 2024   Follow-Up: At [facility], you and your health needs are our priority.  As part of our continuing mission to provide you with exceptional heart care, we have created designated Provider Care Teams.  These Care Teams include your primary Cardiologist (physician) and Advanced Practice Providers (APPs -  Physician Assistants and Nurse Practitioners) who all work together to provide you with the care you need, when you need it.  We recommend signing up for the patient portal called "MyChart".  Sign up information is provided on this After Visit Summary.  MyChart is used to connect with patients for Virtual Visits (Telemedicine).  Patients are able to view lab/test results, encounter notes, upcoming appointments, etc.  Non-urgent messages can be sent to your provider as well.   To learn more about what you can do with MyChart, go to [email or web address].    Your next appointment:   12 month(s)  Provider:   Larae Grooms, MD     Other Instructions

## 2022-12-02 ENCOUNTER — Ambulatory Visit (HOSPITAL_COMMUNITY)
Admission: RE | Admit: 2022-12-02 | Discharge: 2022-12-02 | Disposition: A | Discharge status: Home / Self Care | Payer: Medicare Other | Source: Ambulatory Visit | Attending: Interventional Cardiology | Admitting: Interventional Cardiology

## 2022-12-02 DIAGNOSIS — I6521 Occlusion and stenosis of right carotid artery: Secondary | ICD-10-CM

## 2022-12-03 ENCOUNTER — Other Ambulatory Visit: Payer: Self-pay | Admitting: *Deleted

## 2022-12-03 DIAGNOSIS — I779 Disorder of arteries and arterioles, unspecified: Secondary | ICD-10-CM

## 2022-12-03 DIAGNOSIS — I6521 Occlusion and stenosis of right carotid artery: Secondary | ICD-10-CM

## 2023-04-29 ENCOUNTER — Telehealth: Payer: Self-pay | Admitting: *Deleted

## 2023-04-29 NOTE — Telephone Encounter (Signed)
Primary Cardiologist:Jayadeep Eldridge Dace, MD   Preoperative team, please contact this patient and set up a phone call appointment for further preoperative risk assessment. Please obtain consent and complete medication review. Thank you for your help.   Per office protocol and pending no concerning cardiac symptoms, he may hold aspirin for 5-7 days prior to procedure and should resume as soon as hemodynamically stable postoperatively. ASA listed as 325 mg  since CABG? Should be taking 81 mg asa daily, please verify with pt at time of call.    I also confirmed the patient resides in the state of [place]. As per [facility] Medical Board telemedicine laws, the patient must reside in the state in which the provider is licensed.   Levi Aland, NP-C  04/29/2023, 11:28 AM [address] Office [phone] Fax [phone]

## 2023-04-29 NOTE — Telephone Encounter (Signed)
Left message to call back to schedule a tele pre op appt.  ?

## 2023-04-29 NOTE — Telephone Encounter (Signed)
Patient returned Pre-op call. 

## 2023-04-29 NOTE — Telephone Encounter (Signed)
Patient returned Pre-op call.

## 2023-04-29 NOTE — Telephone Encounter (Signed)
Pre-operative Risk Assessment    Patient Name: AVON BRAHMBHATT  DOB: 10-Jan-1946 MRN: [id number]    DATE OF LAST VISIT: 10/16/22 DR. VARANASI DATE OF NEXT VISIT: NONE   Request for Surgical Clearance    Procedure:   LEFT TOTAL KNEE ARTHROPLASTY  Date of Surgery:  Clearance TBD                                 Surgeon:  DR. Marcene Corning Surgeon's Group or Practice Name:  [facility] Phone number:  [phone] Fax number:  [phone] ATTN: REBECCA LANG   Type of Clearance Requested:   - Medical  - Pharmacy:  Hold Aspirin     Type of Anesthesia:  Spinal   Additional requests/questions:    Elpidio Anis   04/29/2023, 9:29 AM

## 2023-04-30 ENCOUNTER — Other Ambulatory Visit: Payer: Self-pay

## 2023-04-30 MED ORDER — METOPROLOL TARTRATE 25 MG PO TABS: 25.0000 mg | ORAL_TABLET | Freq: Every day | ORAL | 3 refills | Status: DC

## 2023-05-01 ENCOUNTER — Telehealth: Payer: Self-pay | Admitting: *Deleted

## 2023-05-01 ENCOUNTER — Telehealth: Payer: Self-pay | Admitting: Interventional Cardiology

## 2023-05-01 NOTE — Telephone Encounter (Signed)
Pt has been scheduled a tele pre op appt 05/13/23. States the surgeon is waiting for the clearance before he will schedule. Med rec and consent are done.

## 2023-05-01 NOTE — Telephone Encounter (Signed)
Pt has been scheduled a tele pre op appt 05/13/23. States the surgeon is waiting for the clearance before he will schedule. Med rec and consent are done.     Patient Consent for Virtual Visit        Nicholas Wise has provided verbal consent on 05/01/2023 for a virtual visit (video or telephone).   CONSENT FOR VIRTUAL VISIT FOR:  Nicholas Wise  By participating in this virtual visit I agree to the following:  I hereby voluntarily request, consent and authorize [facility] and its employed or contracted physicians, physician assistants, nurse practitioners or other licensed health care professionals (the Practitioner), to provide me with telemedicine health care services (the "Services") as deemed necessary by the treating Practitioner. I acknowledge and consent to receive the Services by the Practitioner via telemedicine. I understand that the telemedicine visit will involve communicating with the Practitioner through live audiovisual communication technology and the disclosure of certain medical information by electronic transmission. I acknowledge that I have been given the opportunity to request an in-person assessment or other available alternative prior to the telemedicine visit and am voluntarily participating in the telemedicine visit.  I understand that I have the right to withhold or withdraw my consent to the use of telemedicine in the course of my care at any time, without affecting my right to future care or treatment, and that the Practitioner or I may terminate the telemedicine visit at any time. I understand that I have the right to inspect all information obtained and/or recorded in the course of the telemedicine visit and may receive copies of available information for a reasonable fee.  I understand that some of the potential risks of receiving the Services via telemedicine include:  Delay or interruption in medical evaluation due to technological equipment failure or  disruption; Information transmitted may not be sufficient (e.g. poor resolution of images) to allow for appropriate medical decision making by the Practitioner; and/or  In rare instances, security protocols could fail, causing a breach of personal health information.  Furthermore, I acknowledge that it is my responsibility to provide information about my medical history, conditions and care that is complete and accurate to the best of my ability. I acknowledge that Practitioner's advice, recommendations, and/or decision may be based on factors not within their control, such as incomplete or inaccurate data provided by me or distortions of diagnostic images or specimens that may result from electronic transmissions. I understand that the practice of medicine is not an exact science and that Practitioner makes no warranties or guarantees regarding treatment outcomes. I acknowledge that a copy of this consent can be made available to me via my patient portal [facility] MyChart), or I can request a printed copy by calling the office of [facility].    I understand that my insurance will be billed for this visit.   I have read or had this consent read to me. I understand the contents of this consent, which adequately explains the benefits and risks of the Services being provided via telemedicine.  I have been provided ample opportunity to ask questions regarding this consent and the Services and have had my questions answered to my satisfaction. I give my informed consent for the services to be provided through the use of telemedicine in my medical care

## 2023-05-01 NOTE — Telephone Encounter (Signed)
Pt returning call regarding Tele Appt. Please advise

## 2023-05-13 ENCOUNTER — Ambulatory Visit: Payer: Medicare Other | Attending: Nurse Practitioner

## 2023-05-13 DIAGNOSIS — Z01810 Encounter for preprocedural cardiovascular examination: Secondary | ICD-10-CM | POA: Diagnosis not present

## 2023-05-13 NOTE — Telephone Encounter (Signed)
Call placed to complete 923 on-call however patient not available at that time.  Message was left on patient's home phone as well as his cell phone to call us back at her earliest convenience.  Robin Searing, NP

## 2023-05-13 NOTE — Progress Notes (Signed)
Virtual Visit via Telephone Note   Because of Nicholas Wise's co-morbid illnesses, he is at least at moderate risk for complications without adequate follow up.  This format is felt to be most appropriate for this patient at this time.  The patient did not have access to video technology/had technical difficulties with video requiring transitioning to audio format only (telephone).  All issues noted in this document were discussed and addressed.  No physical exam could be performed with this format.  Please refer to the patient's chart for his consent to telehealth for [facility].  Evaluation Performed:  Preoperative cardiovascular risk assessment _____________   Date:  05/13/2023   Patient ID:  Nicholas Wise, DOB 1945/11/29, MRN [id number] Patient Location:  Home Provider location:   Office  Primary Care Provider:  Aniceto Boss, MD Primary Cardiologist:  Lance Muss, MD  Chief Complaint / Patient Profile   77 y.o. y/o male with a h/o CAD s/p CABG x 2 with AVR replacement 04/2021, carotid artery disease (100% right ICA (, 60-70% left ICA, TIA 2015, HLD, dilated ascending aorta who is pending left total knee arthroplasty and presents today for telephonic preoperative cardiovascular risk assessment.  History of Present Illness    Nicholas Wise is a 77 y.o. male who presents via audio/video conferencing for a telehealth visit today.  Pt was last seen in cardiology clinic on 10/16/2022 by Dr. Eldridge Dace.  At that time RANDY WHITUS was doing well with no complaints of angina blood pressure was well-controlled. The patient is now pending procedure as outlined above. Since his last visit, he had been walking at the mall for 2-3 miles daily until knee pain persisted.  He is overall doing well with no new complaints from a cardiac perspective.  He denies chest pain, shortness of breath, lower extremity edema, fatigue, palpitations, melena, hematuria, hemoptysis, diaphoresis,  weakness, presyncope, syncope, orthopnea, and PND.      Past Medical History    Past Medical History:  Diagnosis Date   Allergy    Anxiety    Aortic stenosis    Arthritis    Blood transfusion without reported diagnosis    WITH cabg X 2 04/2021   Carotid artery stenosis    11/28/21 [place]: 40-59% LICA, known 100% RICA   Complication of anesthesia    bladder spasms after neck surgery. sent home with a catheter   Coronary artery disease    Depression    Heart murmur    Hemorrhoids    Hiatal hernia    High cholesterol    History of kidney stones    passed x 1   Hx of adenomatous polyp of colon 01/31/2017   Numbness and tingling in left hand    Stroke (HCC) 11/09/2013   Tia    TIA (transient ischemic attack)    no residual effects   Urinary frequency    Past Surgical History:  Procedure Laterality Date   ANTERIOR CERVICAL DECOMP/DISCECTOMY FUSION N/A 04/11/2014   Procedure: ANTERIOR CERVICAL DECOMPRESSION/DISCECTOMY FUSION CERVICAL THREE-FOUR,CERVICAL FOUR-FIVE,CERVICAL FIVE-SIX;  Surgeon: Maeola Harman, MD;  Location: [facility];  Service: Neurosurgery;  Laterality: N/A;   ANTERIOR LAT LUMBAR FUSION Left 02/20/2018   Procedure: Left Lumbar Four-Five Anterolateral lumbar interbody fusion;  Surgeon: Maeola Harman, MD;  Location: [facility] OR;  Service: Neurosurgery;  Laterality: Left;   AORTIC VALVE REPLACEMENT N/A 04/27/2021   Procedure: AORTIC VALVE REPLACEMENT (AVR) USING INSPIRIS AORTIC VALVE;  Surgeon: Alleen Borne, MD;  Location: [facility] OR;  Service: Open Heart Surgery;  Laterality: N/A;   BACK SURGERY     COLONOSCOPY     CORONARY ARTERY BYPASS GRAFT N/A 04/27/2021   Procedure: CORONARY ARTERY BYPASS GRAFTING (CABG) X  3 , USING LEFT INTERNAL MAMMARY ARTERY AND RIGHT ENDOSCOPIC GREATER SAPHENOUS VEIN CONDUITS;  Surgeon: Alleen Borne, MD;  Location: [facility] OR;  Service: Open Heart Surgery;  Laterality: N/A;   ENDOVEIN HARVEST OF GREATER SAPHENOUS VEIN Right 04/27/2021    Procedure: ENDOVEIN HARVEST OF GREATER SAPHENOUS VEIN;  Surgeon: Alleen Borne, MD;  Location: [facility] OR;  Service: Open Heart Surgery;  Laterality: Right;   KNEE SURGERY Right    LUMBAR LAMINECTOMY/DECOMPRESSION MICRODISCECTOMY Bilateral 03/13/2022   Procedure: Lumbar One-Two, Lumbar Two-Three Decompressive lumbar laminectomy, medial facetectomy and foraminotomies with Right Lumbar One-Two Discetomy;  Surgeon: Tia Alert, MD;  Location: [facility] OR;  Service: Neurosurgery;  Laterality: Bilateral;   LUMBAR PERCUTANEOUS PEDICLE SCREW 1 LEVEL N/A 02/20/2018   Procedure: Lumbar Percutaneous Pedicle Screw Placment Lumbar four-five;  Surgeon: Maeola Harman, MD;  Location: [facility] OR;  Service: Neurosurgery;  Laterality: N/A;   POLYPECTOMY     RIGHT HEART CATH AND CORONARY ANGIOGRAPHY N/A 03/27/2021   Procedure: RIGHT HEART CATH AND CORONARY ANGIOGRAPHY;  Surgeon: Corky Crafts, MD;  Location: [facility] INVASIVE CV LAB;  Service: Cardiovascular;  Laterality: N/A;   TEE WITHOUT CARDIOVERSION N/A 04/27/2021   Procedure: TRANSESOPHAGEAL ECHOCARDIOGRAM (TEE);  Surgeon: Alleen Borne, MD;  Location: [facility] OR;  Service: Open Heart Surgery;  Laterality: N/A;   TONSILLECTOMY     TOTAL KNEE ARTHROPLASTY Right 06/08/2019   Procedure: RIGHT TOTAL KNEE ARTHROPLASTY;  Surgeon: Marcene Corning, MD;  Location: [facility] ORS;  Service: Orthopedics;  Laterality: Right;   XI ROBOTIC ASSISTED INGUINAL HERNIA REPAIR WITH MESH Bilateral 11/21/2020   Procedure: ROBOTIC BILATERAL INGUINAL HERNIA REPAIR WITH MESH;  Surgeon: Axel Filler, MD;  Location: [facility] OR;  Service: General;  Laterality: Bilateral;    Allergies  Allergies  Allergen Reactions   Bee Pollen Cough    Home Medications    Prior to Admission medications   Medication Sig Start Date End Date Taking? Authorizing Provider  acetaminophen (TYLENOL) 500 MG tablet Take 1,000 mg by mouth in the morning and at bedtime.    [provider]  allopurinol (ZYLOPRIM) 300 MG  tablet Take 300 mg by mouth in the morning.    [provider]  ALPRAZolam Prudy Feeler) 0.5 MG tablet Take 0.25 mg by mouth at bedtime.    [provider]  amoxicillin (AMOXIL) 500 MG capsule SMARTSIG:4 Capsule(s) By Mouth Once 04/30/22   [provider]  aspirin EC 325 MG EC tablet Take 1 tablet (325 mg total) by mouth daily. Patient taking differently: Take 325 mg by mouth at bedtime. 05/03/21   Sharlene Dory, PA-C  atorvastatin (LIPITOR) 40 MG tablet Take 40 mg by mouth every evening.     [provider]  finasteride (PROSCAR) 5 MG tablet Take 5 mg by mouth daily.    [provider]  fluorouracil (EFUDEX) 5 % cream Apply 1 application topically daily as needed (sun damage). 11/16/20   [provider]  metoprolol tartrate (LOPRESSOR) 25 MG tablet Take 1 tablet (25 mg total) by mouth daily. 04/30/23 04/29/24  Gaston Islam., NP  montelukast (SINGULAIR) 10 MG tablet Take 10 mg by mouth at bedtime. 08/15/21   [provider]  Polyethyl Glycol-Propyl Glycol (SYSTANE OP) Place 1 drop into both  eyes in the morning and at bedtime.    [provider]  potassium citrate (UROCIT-K) 10 MEQ (1080 MG) SR tablet Take 10 mEq by mouth 2 (two) times daily.    [provider]  senna-docusate (SENOKOT-S) 8.6-50 MG tablet Take 2 tablets by mouth at bedtime.    [provider]  sertraline (ZOLOFT) 100 MG tablet Take 100 mg by mouth at bedtime.    [provider]  tamsulosin (FLOMAX) 0.4 MG CAPS capsule Take 0.4 mg by mouth 2 (two) times daily.    [provider]  triamcinolone (NASACORT) 55 MCG/ACT AERO nasal inhaler Place 1 spray into the nose in the morning.    [provider]  zinc gluconate 50 MG tablet Take 50 mg by mouth daily.    [provider]    Physical Exam    Vital Signs:  Nalan Capizzi Craney does not have vital signs available for review today.125/72  Given telephonic nature of  communication, physical exam is limited. AAOx3. NAD. Normal affect.  Speech and respirations are unlabored.  Accessory Clinical Findings    None  Assessment & Plan    1.  Preoperative Cardiovascular Risk Assessment: -Patient's RCRI score is 6.6%  The patient affirms he has been doing well without any new cardiac symptoms. They are able to achieve 6 METS without cardiac limitations. Therefore, based on ACC/AHA guidelines, the patient would be at acceptable risk for the planned procedure without further cardiovascular testing. The patient was advised that if he develops new symptoms prior to surgery to contact our office to arrange for a follow-up visit, and he verbalized understanding.   The patient was advised that if he develops new symptoms prior to surgery to contact our office to arrange for a follow-up visit, and he verbalized understanding.  Patient can hold ASA 81 mg 7 days prior to procedure and should restart postprocedure when surgically safe.  A copy of this note will be routed to requesting surgeon.  Time:   Today, I have spent 8 minutes with the patient with telehealth technology discussing medical history, symptoms, and management plan.     Napoleon Form, Leodis Rains, NP  05/13/2023, 6:58 AM

## 2023-05-16 ENCOUNTER — Other Ambulatory Visit: Payer: Self-pay | Admitting: Orthopaedic Surgery

## 2023-05-27 NOTE — Patient Instructions (Signed)
SURGICAL WAITING ROOM VISITATION  Patients having surgery or a procedure may have no more than 2 support people in the waiting area - these visitors may rotate.    Children under the age of 56 must have an adult with them who is not the patient.  Due to an increase in RSV and influenza rates and associated hospitalizations, children ages 65 and under may not visit patients in [facility] hospitals.  If the patient needs to stay at the hospital during part of their recovery, the visitor guidelines for inpatient rooms apply. Pre-op nurse will coordinate an appropriate time for 1 support person to accompany patient in pre-op.  This support person may not rotate.    Please refer to the [facility] website for the visitor guidelines for Inpatients (after your surgery is over and you are in a regular room).    Your procedure is scheduled on: 06/03/23   Report to [facility] Main Entrance    Report to admitting at 7:05 AM   Call this number if you have problems the morning of surgery [phone]   Do not eat food :After Midnight.   After Midnight you may have the following liquids until 6:35 AM DAY OF SURGERY  Water Non-Citrus Juices (without pulp, NO RED-Apple, White grape, White cranberry) Black Coffee (NO MILK/CREAM OR CREAMERS, sugar ok)  Clear Tea (NO MILK/CREAM OR CREAMERS, sugar ok) regular and decaf                             Plain Jell-O (NO RED)                                           Fruit ices (not with fruit pulp, NO RED)                                     Popsicles (NO RED)                                                               Sports drinks like Gatorade (NO RED)                 The day of surgery:  Drink ONE (1) Pre-Surgery Clear Ensure at 6:35 AM the morning of surgery. Drink in one sitting. Do not sip.  This drink was given to you during your hospital  pre-op appointment visit. Nothing else to drink after completing the  Pre-Surgery Clear  Ensure.          If you have questions, please contact your surgeon's office.   FOLLOW BOWEL PREP AND ANY ADDITIONAL PRE OP INSTRUCTIONS YOU RECEIVED FROM YOUR SURGEON'S OFFICE!!!     Oral Hygiene is also important to reduce your risk of infection.                                    Remember - BRUSH YOUR TEETH THE MORNING OF SURGERY WITH YOUR REGULAR TOOTHPASTE  DENTURES WILL  BE REMOVED PRIOR TO SURGERY PLEASE DO NOT APPLY "Poly grip" OR ADHESIVES!!!   Stop all vitamins and herbal supplements 7 days before surgery.   Take these medicines the morning of surgery with A SIP OF WATER: Tylenol, Allopurinol, Metoprolol, Tamsulosin                               You may not have any metal on your body including jewelry, and body piercing             Do not wear lotions, powders, cologne, or deodorant              Men may shave face and neck.   Do not bring valuables to the hospital. [facility] IS NOT             RESPONSIBLE   FOR VALUABLES.   Contacts, glasses, dentures or bridgework may not be worn into surgery.  DO NOT BRING YOUR HOME MEDICATIONS TO THE HOSPITAL. PHARMACY WILL DISPENSE MEDICATIONS LISTED ON YOUR MEDICATION LIST TO YOU DURING YOUR ADMISSION IN THE HOSPITAL!    Patients discharged on the day of surgery will not be allowed to drive home.  Someone NEEDS to stay with you for the first 24 hours after anesthesia.              Please read over the following fact sheets you were given: IF YOU HAVE QUESTIONS ABOUT YOUR PRE-OP INSTRUCTIONS PLEASE CALL [phone] Wise    If you received a COVID test during your pre-op visit  it is requested that you wear a mask when out in public, stay away from anyone that may not be feeling well and notify your surgeon if you develop symptoms. If you test positive for Covid or have been in contact with anyone that has tested positive in the last 10 days please notify you surgeon.      Pre-operative 5 CHG Bath Instructions   You can  play a key role in reducing the risk of infection after surgery. Your skin needs to be as free of germs as possible. You can reduce the number of germs on your skin by washing with CHG (chlorhexidine gluconate) soap before surgery. CHG is an antiseptic soap that kills germs and continues to kill germs even after washing.   DO NOT use if you have an allergy to chlorhexidine/CHG or antibacterial soaps. If your skin becomes reddened or irritated, stop using the CHG and notify one of our RNs at [phone].   Please shower with the CHG soap starting 4 days before surgery using the following schedule:     Please keep in mind the following:  DO NOT shave, including legs and underarms, starting the day of your first shower.   You may shave your face at any point before/day of surgery.  Place clean sheets on your bed the day you start using CHG soap. Use a clean washcloth (not used since being washed) for each shower. DO NOT sleep with pets once you start using the CHG.   CHG Shower Instructions:  If you choose to wash your hair and private area, wash first with your normal shampoo/soap.  After you use shampoo/soap, rinse your hair and body thoroughly to remove shampoo/soap residue.  Turn the water OFF and apply about 3 tablespoons (45 ml) of CHG soap to a CLEAN washcloth.  Apply CHG soap ONLY FROM YOUR NECK DOWN TO YOUR TOES (washing  for 3-5 minutes)  DO NOT use CHG soap on face, private areas, open wounds, or sores.  Pay special attention to the area where your surgery is being performed.  If you are having back surgery, having someone wash your back for you may be helpful. Wait 2 minutes after CHG soap is applied, then you may rinse off the CHG soap.  Pat dry with a clean towel  Put on clean clothes/pajamas   If you choose to wear lotion, please use ONLY the CHG-compatible lotions on the back of this paper.     Additional instructions for the day of surgery: DO NOT APPLY any lotions,  deodorants, cologne, or perfumes.   Put on clean/comfortable clothes.  Brush your teeth.  Ask your nurse before applying any prescription medications to the skin.      CHG Compatible Lotions   Aveeno Moisturizing lotion  Cetaphil Moisturizing Cream  Cetaphil Moisturizing Lotion  Clairol Herbal Essence Moisturizing Lotion, Dry Skin  Clairol Herbal Essence Moisturizing Lotion, Extra Dry Skin  Clairol Herbal Essence Moisturizing Lotion, Normal Skin  Curel Age Defying Therapeutic Moisturizing Lotion with Alpha Hydroxy  Curel Extreme Care Body Lotion  Curel Soothing Hands Moisturizing Hand Lotion  Curel Therapeutic Moisturizing Cream, Fragrance-Free  Curel Therapeutic Moisturizing Lotion, Fragrance-Free  Curel Therapeutic Moisturizing Lotion, Original Formula  Eucerin Daily Replenishing Lotion  Eucerin Dry Skin Therapy Plus Alpha Hydroxy Crme  Eucerin Dry Skin Therapy Plus Alpha Hydroxy Lotion  Eucerin Original Crme  Eucerin Original Lotion  Eucerin Plus Crme Eucerin Plus Lotion  Eucerin TriLipid Replenishing Lotion  Keri Anti-Bacterial Hand Lotion  Keri Deep Conditioning Original Lotion Dry Skin Formula Softly Scented  Keri Deep Conditioning Original Lotion, Fragrance Free Sensitive Skin Formula  Keri Lotion Fast Absorbing Fragrance Free Sensitive Skin Formula  Keri Lotion Fast Absorbing Softly Scented Dry Skin Formula  Keri Original Lotion  Keri Skin Renewal Lotion Keri Silky Smooth Lotion  Keri Silky Smooth Sensitive Skin Lotion  Nivea Body Creamy Conditioning Oil  Nivea Body Extra Enriched Lotion  Nivea Body Original Lotion  Nivea Body Sheer Moisturizing Lotion Nivea Crme  Nivea Skin Firming Lotion  NutraDerm 30 Skin Lotion  NutraDerm Skin Lotion  NutraDerm Therapeutic Skin Cream  NutraDerm Therapeutic Skin Lotion  ProShield Protective Hand Cream  Provon moisturizing lotion   Incentive Spirometer  An incentive spirometer is a tool that can help keep your lungs  clear and active. This tool measures how well you are filling your lungs with each breath. Taking long deep breaths may help reverse or decrease the chance of developing breathing (pulmonary) problems (especially infection) following: A long period of time when you are unable to move or be active. BEFORE THE PROCEDURE  If the spirometer includes an indicator to show your best effort, your nurse or respiratory therapist will set it to a desired goal. If possible, sit up straight or lean slightly forward. Try not to slouch. Hold the incentive spirometer in an upright position. INSTRUCTIONS FOR USE  Sit on the edge of your bed if possible, or sit up as far as you can in bed or on a chair. Hold the incentive spirometer in an upright position. Breathe out normally. Place the mouthpiece in your mouth and seal your lips tightly around it. Breathe in slowly and as deeply as possible, raising the piston or the ball toward the top of the column. Hold your breath for 3-5 seconds or for as long as possible. Allow the piston or ball to fall to  the bottom of the column. Remove the mouthpiece from your mouth and breathe out normally. Rest for a few seconds and repeat Steps 1 through 7 at least 10 times every 1-2 hours when you are awake. Take your time and take a few normal breaths between deep breaths. The spirometer may include an indicator to show your best effort. Use the indicator as a goal to work toward during each repetition. After each set of 10 deep breaths, practice coughing to be sure your lungs are clear. If you have an incision (the cut made at the time of surgery), support your incision when coughing by placing a pillow or rolled up towels firmly against it. Once you are able to get out of bed, walk around indoors and cough well. You may stop using the incentive spirometer when instructed by your caregiver.  RISKS AND COMPLICATIONS Take your time so you do not get dizzy or light-headed. If you  are in pain, you may need to take or ask for pain medication before doing incentive spirometry. It is harder to take a deep breath if you are having pain. AFTER USE Rest and breathe slowly and easily. It can be helpful to keep track of a log of your progress. Your caregiver can provide you with a simple table to help with this. If you are using the spirometer at home, follow these instructions: SEEK MEDICAL CARE IF:  You are having difficultly using the spirometer. You have trouble using the spirometer as often as instructed. Your pain medication is not giving enough relief while using the spirometer. You develop fever of 100.5 F (38.1 C) or higher. SEEK IMMEDIATE MEDICAL CARE IF:  You cough up bloody sputum that had not been present before. You develop fever of 102 F (38.9 C) or greater. You develop worsening pain at or near the incision site. MAKE SURE YOU:  Understand these instructions. Will watch your condition. Will get help right away if you are not doing well or get worse. Document Released: 11/11/2006 Document Revised: 09/23/2011 Document Reviewed: 01/12/2007 [facility] Patient Information 2014 [place], [place].   ________________________________________________________________________

## 2023-05-27 NOTE — Progress Notes (Signed)
COVID Vaccine Completed:  Date of COVID positive in last 90 days:  PCP - Salvatore Decent, MD Cardiologist - Lance Muss, MD  Cardiac clearance by Ander Slade, NP 05/13/23 in Epic  Chest x-ray -  EKG -  Stress Test -  ECHO - 06/11/21 Epic Cardiac Cath - 03/27/21 Epic Pacemaker/ICD device last checked: Spinal Cord Stimulator:  Bowel Prep -   Sleep Study -  CPAP -   Fasting Blood Sugar -  Checks Blood Sugar _____ times a day  Last dose of GLP1 agonist-  N/A GLP1 instructions:  Hold 7 days before surgery    Last dose of SGLT-2 inhibitors-  N/A SGLT-2 instructions:  Hold 3 days before surgery    Blood Thinner Instructions:  Time Aspirin Instructions: ASA 81, hold 7 days Last Dose:  Activity level:  Can go up a flight of stairs and perform activities of daily living without stopping and without symptoms of chest pain or shortness of breath.  Able to exercise without symptoms  Unable to go up a flight of stairs without symptoms of     Anesthesia review: CAD, severe aortic stenosis, aortic dilation, CABG x3, heart murmur, TIA  Patient denies shortness of breath, fever, cough and chest pain at PAT appointment  Patient verbalized understanding of instructions that were given to them at the PAT appointment. Patient was also instructed that they will need to review over the PAT instructions again at home before surgery.

## 2023-05-28 ENCOUNTER — Encounter (HOSPITAL_COMMUNITY): Payer: Self-pay

## 2023-05-28 ENCOUNTER — Encounter (HOSPITAL_COMMUNITY)
Admission: RE | Admit: 2023-05-28 | Discharge: 2023-05-28 | Disposition: A | Discharge status: Home / Self Care | Payer: Medicare Other | Source: Ambulatory Visit | Attending: Orthopaedic Surgery

## 2023-05-28 ENCOUNTER — Other Ambulatory Visit: Payer: Self-pay

## 2023-05-28 VITALS — BP 124/79 | HR 60 | Temp 97.8°F | Resp 14 | Ht 70.0 in | Wt 165.0 lb

## 2023-05-28 DIAGNOSIS — R011 Cardiac murmur, unspecified: Secondary | ICD-10-CM | POA: Diagnosis not present

## 2023-05-28 DIAGNOSIS — Z951 Presence of aortocoronary bypass graft: Secondary | ICD-10-CM

## 2023-05-28 DIAGNOSIS — Z01818 Encounter for other preprocedural examination: Secondary | ICD-10-CM | POA: Diagnosis present

## 2023-05-28 HISTORY — DX: Presence of prosthetic heart valve: Z95.2

## 2023-05-28 LAB — CBC
HCT: 41.4 % (ref 39.0–52.0)
Hemoglobin: 13.4 g/dL (ref 13.0–17.0)
MCH: 33.3 pg (ref 26.0–34.0)
MCHC: 32.4 g/dL (ref 30.0–36.0)
MCV: 102.7 fL — ABNORMAL HIGH (ref 80.0–100.0)
Platelets: 170 10*3/uL (ref 150–400)
RBC: 4.03 MIL/uL — ABNORMAL LOW (ref 4.22–5.81)
RDW: 13.3 % (ref 11.5–15.5)
WBC: 5.5 10*3/uL (ref 4.0–10.5)
nRBC: 0 % (ref 0.0–0.2)

## 2023-05-28 LAB — BASIC METABOLIC PANEL
Anion gap: 10 (ref 5–15)
BUN: 24 mg/dL — ABNORMAL HIGH (ref 8–23)
CO2: 27 mmol/L (ref 22–32)
Calcium: 9.5 mg/dL (ref 8.9–10.3)
Chloride: 100 mmol/L (ref 98–111)
Creatinine, Ser: 0.68 mg/dL (ref 0.61–1.24)
GFR, Estimated: >60 mL/min (ref >60)
Glucose, Bld: 101 mg/dL — ABNORMAL HIGH (ref 70–99)
Potassium: 4.7 mmol/L (ref 3.5–5.1)
Sodium: 137 mmol/L (ref 135–145)

## 2023-05-28 LAB — SURGICAL PCR SCREEN
MRSA, PCR: NEGATIVE
Staphylococcus aureus: POSITIVE — AB

## 2023-05-28 NOTE — Progress Notes (Signed)
[facility] results routed to Dr. Dalldorf 

## 2023-05-29 ENCOUNTER — Encounter (HOSPITAL_COMMUNITY): Payer: Self-pay

## 2023-05-29 NOTE — Anesthesia Preprocedure Evaluation (Addendum)
Anesthesia Evaluation  Patient identified by MRN, date of birth, ID band Patient awake    Reviewed: Allergy & Precautions, NPO status , Patient's Chart, lab work & pertinent test results  Airway Mallampati: I       Dental no notable dental hx.    Pulmonary former smoker   Pulmonary exam normal        Cardiovascular + CAD  Normal cardiovascular exam+ Valvular Problems/Murmurs      Neuro/Psych  PSYCHIATRIC DISORDERS Anxiety Depression    TIA Neuromuscular disease    GI/Hepatic Neg liver ROS,,,  Endo/Other  negative endocrine ROS    Renal/GU negative Renal ROS     Musculoskeletal   Abdominal Normal abdominal exam  (+)   Peds  Hematology negative hematology ROS (+)   Anesthesia Other Findings  Carotid US 12/02/22:   Summary: Right Carotid: Evidence consistent with a total occlusion of the right ICA.   Left Carotid: Velocities in the left ICA are consistent with a 40-59% stenosis.               The CCA appears occluded.   Vertebrals:  Bilateral vertebral arteries demonstrate antegrade flow. Subclavians: Normal flow hemodynamics were seen in bilateral subclavian              arteries.   *See table(s) above for measurements and observations. Suggest follow up study in 12 months.   Echo 06/11/2021:   IMPRESSIONS      1. Left ventricular ejection fraction, by estimation, is 60 to 65%. The left ventricle has normal function. The left ventricle has no regional wall motion abnormalities. There is mild left ventricular hypertrophy. Left ventricular diastolic parameters are indeterminate.  2. Right ventricular systolic function is mildly reduced. The right ventricular size is normal. Tricuspid regurgitation signal is inadequate for assessing PA pressure.  3. A small pericardial effusion is present. The pericardial effusion is posterior to the left ventricle.  4. The mitral valve is normal in structure. Trivial  mitral valve regurgitation. No evidence of mitral stenosis.  5. The interatrial septum is aneurysmal  6. There is a 25 mm Edwards Inspiris Resilia valve present in the aortic position     Aortic valve regurgitation is not visualized. Echo findings are consistent with normal structure and function of the aortic valve prosthesis. Aortic valve mean gradient measures 7.0 mmHg.       Reproductive/Obstetrics                             Anesthesia Physical Anesthesia Plan  ASA: 3  Anesthesia Plan: Spinal   Post-op Pain Management: Regional block*   Induction:   PONV Risk Score and Plan: 1 and Ondansetron, Dexamethasone, Treatment may vary due to age or medical condition and Propofol infusion  Airway Management Planned: Natural Airway and Simple Face Mask  Additional Equipment: None  Intra-op Plan:   Post-operative Plan:   Informed Consent: I have reviewed the patients History and Physical, chart, labs and discussed the procedure including the risks, benefits and alternatives for the proposed anesthesia with the patient or authorized representative who has indicated his/her understanding and acceptance.       Plan Discussed with: CRNA  Anesthesia Plan Comments: (See PAT note from 11/13 by Sherlie Ban PA-C )        Anesthesia Quick Evaluation

## 2023-05-29 NOTE — Progress Notes (Signed)
Case: [id number] Date/Time: 06/03/23 4403   Procedure: LEFT TOTAL KNEE ARTHROPLASTY (Left: Knee)   Anesthesia type: Spinal   Pre-op diagnosis: LEFT KNEE DEGENERATIVE JOINT DISEASE   Location: [facility]   Surgeons: Marcene Corning, MD       DISCUSSION: Nicholas Wise is a 77 yo male who presents to PAT prior to surgery above. PMH significant for former smoking, CAD s/p CABG (04/2021), carotid artery stenosis, AS s/p AVR (04/2021), TAA, TIA (2015), hiatal hernia, arthritis, cervical DDD s/p ACDF (2015), s/p lumbar fusion (2019), anxiety, depression.  Prior complication from anesthesia includes: "bladder spasms after neck surgery. Sent home with a catheter"  Patient follows with Cardiology for hx of CAD s/p CABG, AS s/p AVR in 2022, carotid artery disease. Last seen in clinic on 10/16/22 by Dr. Eldridge Dace. Noted to be doing well. Had a repeat carotid US which showed total occlusion of ICA on the right and 40-59% stenosis of the left ICA. Cleared via telephone visit on 10/29:  "Preoperative Cardiovascular Risk Assessment: -Patient's RCRI score is 6.6%   The patient affirms he has been doing well without any new cardiac symptoms. They are able to achieve 6 METS without cardiac limitations. Therefore, based on ACC/AHA guidelines, the patient would be at acceptable risk for the planned procedure without further cardiovascular testing. The patient was advised that if he develops new symptoms prior to surgery to contact our office to arrange for a follow-up visit, and he verbalized understanding.    Patient can hold ASA 81 mg 7 days prior to procedure and should restart postprocedure when surgically safe."  VS: BP 124/79   Pulse 60   Temp 36.6 C (Oral)   Resp 14   Ht 5\' 10"  (1.778 m)   Wt 74.8 kg   SpO2 96%   BMI 23.68 kg/m   PROVIDERS: Aniceto Boss, MD Cardiologist - Lance Muss, MD  LABS: Labs reviewed: Acceptable for surgery. (all labs ordered are listed, but only abnormal  results are displayed)  Labs Reviewed  SURGICAL PCR SCREEN - Abnormal; Notable for the following components:      Result Value   Staphylococcus aureus POSITIVE (*)    All other components within normal limits  BASIC METABOLIC PANEL - Abnormal; Notable for the following components:   Glucose, Bld 101 (*)    BUN 24 (*)    All other components within normal limits  CBC - Abnormal; Notable for the following components:   RBC 4.03 (*)    MCV 102.7 (*)    All other components within normal limits     IMAGES:   EKG:  Sinus bradycardia, rate 56 Right bundle branch block   CV: Carotid US 12/02/22:  Summary: Right Carotid: Evidence consistent with a total occlusion of the right ICA.  Left Carotid: Velocities in the left ICA are consistent with a 40-59% stenosis.               The CCA appears occluded.  Vertebrals:  Bilateral vertebral arteries demonstrate antegrade flow. Subclavians: Normal flow hemodynamics were seen in bilateral subclavian              arteries.  *See table(s) above for measurements and observations. Suggest follow up study in 12 months.  Echo 06/11/2021:  IMPRESSIONS    1. Left ventricular ejection fraction, by estimation, is 60 to 65%. The left ventricle has normal function. The left ventricle has no regional wall motion abnormalities. There is mild left ventricular hypertrophy.  Left ventricular diastolic parameters are indeterminate.  2. Right ventricular systolic function is mildly reduced. The right ventricular size is normal. Tricuspid regurgitation signal is inadequate for assessing PA pressure.  3. A small pericardial effusion is present. The pericardial effusion is posterior to the left ventricle.  4. The mitral valve is normal in structure. Trivial mitral valve regurgitation. No evidence of mitral stenosis.  5. The interatrial septum is aneurysmal  6. There is a 25 mm Edwards Inspiris Resilia valve present in the aortic position      Aortic valve regurgitation is not visualized. Echo findings are consistent with normal structure and function of the aortic valve prosthesis. Aortic valve mean gradient measures 7.0 mmHg.  Past Medical History:  Diagnosis Date   Allergy    Anxiety    Aortic stenosis    Aortic valve replaced    Arthritis    Blood transfusion without reported diagnosis    WITH cabg X 2 04/2021   Carotid artery stenosis    11/28/21 [place]: 40-59% LICA, known 100% RICA   Complication of anesthesia    bladder spasms after neck surgery. sent home with a catheter   Coronary artery disease    Depression    Heart murmur    Hemorrhoids    Hiatal hernia    High cholesterol    History of kidney stones    passed x 1   Hx of adenomatous polyp of colon 01/31/2017   Numbness and tingling in left hand    Stroke (HCC) 11/09/2013   Tia    TIA (transient ischemic attack)    no residual effects   Urinary frequency     Past Surgical History:  Procedure Laterality Date   ANTERIOR CERVICAL DECOMP/DISCECTOMY FUSION N/A 04/11/2014   Procedure: ANTERIOR CERVICAL DECOMPRESSION/DISCECTOMY FUSION CERVICAL THREE-FOUR,CERVICAL FOUR-FIVE,CERVICAL FIVE-SIX;  Surgeon: Maeola Harman, MD;  Location: [facility];  Service: Neurosurgery;  Laterality: N/A;   ANTERIOR LAT LUMBAR FUSION Left 02/20/2018   Procedure: Left Lumbar Four-Five Anterolateral lumbar interbody fusion;  Surgeon: Maeola Harman, MD;  Location[facility] OR;  Service: Neurosurgery;  Laterality: Left;   AORTIC VALVE REPLACEMENT N/A 04/27/2021   Procedure: AORTIC VALVE REPLACEMENT (AVR) USING INSPIRIS AORTIC VALVE;  Surgeon: Alleen Borne, MD;  Location: [facility] OR;  Service: Open Heart Surgery;  Laterality: N/A;   BACK SURGERY     COLONOSCOPY     CORONARY ARTERY BYPASS GRAFT N/A 04/27/2021   Procedure: CORONARY ARTERY BYPASS GRAFTING (CABG) X  3 , USING LEFT INTERNAL MAMMARY ARTERY AND RIGHT ENDOSCOPIC GREATER SAPHENOUS VEIN CONDUITS;  Surgeon: Alleen Borne, MD;   Location: [facility] OR;  Service: Open Heart Surgery;  Laterality: N/A;   ENDOVEIN HARVEST OF GREATER SAPHENOUS VEIN Right 04/27/2021   Procedure: ENDOVEIN HARVEST OF GREATER SAPHENOUS VEIN;  Surgeon: Alleen Borne, MD;  Location: [facility] OR;  Service: Open Heart Surgery;  Laterality: Right;   KNEE SURGERY Right    LUMBAR LAMINECTOMY/DECOMPRESSION MICRODISCECTOMY Bilateral 03/13/2022   Procedure: Lumbar One-Two, Lumbar Two-Three Decompressive lumbar laminectomy, medial facetectomy and foraminotomies with Right Lumbar One-Two Discetomy;  Surgeon: Tia Alert, MD;  Location: [facility] OR;  Service: Neurosurgery;  Laterality: Bilateral;   LUMBAR PERCUTANEOUS PEDICLE SCREW 1 LEVEL N/A 02/20/2018   Procedure: Lumbar Percutaneous Pedicle Screw Placment Lumbar four-five;  Surgeon: Maeola Harman, MD;  Location: [facility] OR;  Service: Neurosurgery;  Laterality: N/A;   POLYPECTOMY     RIGHT HEART CATH AND CORONARY ANGIOGRAPHY N/A 03/27/2021   Procedure: RIGHT HEART  CATH AND CORONARY ANGIOGRAPHY;  Surgeon: Corky Crafts, MD;  Location: [facility] INVASIVE CV LAB;  Service: Cardiovascular;  Laterality: N/A;   TEE WITHOUT CARDIOVERSION N/A 04/27/2021   Procedure: TRANSESOPHAGEAL ECHOCARDIOGRAM (TEE);  Surgeon: Alleen Borne, MD;  Location: [facility] OR;  Service: Open Heart Surgery;  Laterality: N/A;   TONSILLECTOMY     TOTAL KNEE ARTHROPLASTY Right 06/08/2019   Procedure: RIGHT TOTAL KNEE ARTHROPLASTY;  Surgeon: Marcene Corning, MD;  Location: [facility];  Service: Orthopedics;  Laterality: Right;   XI ROBOTIC ASSISTED INGUINAL HERNIA REPAIR WITH MESH Bilateral 11/21/2020   Procedure: ROBOTIC BILATERAL INGUINAL HERNIA REPAIR WITH MESH;  Surgeon: Axel Filler, MD;  Location: [facility] OR;  Service: General;  Laterality: Bilateral;    MEDICATIONS:  acetaminophen (TYLENOL) 500 MG tablet   allopurinol (ZYLOPRIM) 300 MG tablet   ALPRAZolam (XANAX) 0.5 MG tablet   amoxicillin (AMOXIL) 500 MG capsule   aspirin EC 325 MG EC tablet    atorvastatin (LIPITOR) 40 MG tablet   finasteride (PROSCAR) 5 MG tablet   fluorouracil (EFUDEX) 5 % cream   latanoprost (XALATAN) 0.005 % ophthalmic solution   metoprolol tartrate (LOPRESSOR) 25 MG tablet   montelukast (SINGULAIR) 10 MG tablet   potassium citrate (UROCIT-K) 10 MEQ (1080 MG) SR tablet   senna-docusate (SENOKOT-S) 8.6-50 MG tablet   sertraline (ZOLOFT) 100 MG tablet   tamsulosin (FLOMAX) 0.4 MG CAPS capsule   triamcinolone (NASACORT) 55 MCG/ACT AERO nasal inhaler   zinc gluconate 50 MG tablet   No current facility-administered medications for this encounter.   Marcille Blanco [facility] Surgical Short Stay/Anesthesiology [facility] Phone [phone] 05/29/2023 11:59 AM

## 2023-05-30 NOTE — H&P (Signed)
TOTAL KNEE ADMISSION H&P  Patient is being admitted for left total knee arthroplasty.  Subjective:  Chief Complaint:left knee pain.  HPI: Nicholas Wise, 77 y.o. male, has a history of pain and functional disability in the left knee due to arthritis and has failed non-surgical conservative treatments for greater than 12 weeks to includeNSAID's and/or analgesics, corticosteriod injections, flexibility and strengthening excercises, supervised PT with diminished ADL's post treatment, use of assistive devices, weight reduction as appropriate, and activity modification.  Onset of symptoms was gradual, starting 5 years ago with gradually worsening course since that time. The patient noted no past surgery on the left knee(s).  Patient currently rates pain in the left knee(s) at 10 out of 10 with activity. Patient has night pain, worsening of pain with activity and weight bearing, pain that interferes with activities of daily living, crepitus, and joint swelling.  Patient has evidence of subchondral cysts, subchondral sclerosis, periarticular osteophytes, and joint space narrowing by imaging studies. There is no active infection.  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   S/P CABG x 3 04/27/2021   S/P aortic valve replacement with bioprosthetic valve 04/27/2021   Coronary artery disease 04/27/2021   Severe aortic stenosis    Primary localized osteoarthritis of right knee 06/08/2019   Primary osteoarthritis of right knee 06/08/2019   Lumbar scoliosis 02/20/2018   Heart murmur 01/05/2018   Hx of adenomatous polyp of colon 01/31/2017   Antiplatelet or antithrombotic long-term use 01/10/2017   Cervical myelopathy with cervical radiculopathy (HCC) 04/11/2014    Cervical spondylosis with myelopathy 04/11/2014   Past Medical History:  Diagnosis Date   Allergy    Anxiety    Aortic stenosis    Aortic valve replaced    Arthritis    Blood transfusion without reported diagnosis    WITH cabg X 2 04/2021   Carotid artery stenosis    11/28/21 [place]: 40-59% LICA, known 100% RICA   Complication of anesthesia    bladder spasms after neck surgery. sent home with a catheter   Coronary artery disease    Depression    Heart murmur    Hemorrhoids    Hiatal hernia    High cholesterol    History of kidney stones    passed x 1   Hx of adenomatous polyp of colon 01/31/2017   Numbness and tingling in left hand    TIA (transient ischemic attack)    2015; no residual effects   Urinary frequency     Past Surgical History:  Procedure Laterality Date   ANTERIOR CERVICAL DECOMP/DISCECTOMY FUSION N/A 04/11/2014   Procedure: ANTERIOR CERVICAL DECOMPRESSION/DISCECTOMY FUSION CERVICAL THREE-FOUR,CERVICAL FOUR-FIVE,CERVICAL FIVE-SIX;  Surgeon: Maeola Harman, MD;  Location: [facility];  Service: Neurosurgery;  Laterality: N/A;   ANTERIOR LAT LUMBAR FUSION Left 02/20/2018   Procedure: Left Lumbar Four-Five Anterolateral lumbar interbody fusion;  Surgeon: Maeola Harman, MD;  Location: [facility] OR;  Service: Neurosurgery;  Laterality: Left;   AORTIC VALVE REPLACEMENT N/A 04/27/2021   Procedure: AORTIC VALVE REPLACEMENT (AVR) USING INSPIRIS AORTIC VALVE;  Surgeon: Alleen Borne, MD;  Location: [facility] OR;  Service: Open Heart Surgery;  Laterality: N/A;   BACK SURGERY     COLONOSCOPY     CORONARY ARTERY BYPASS GRAFT N/A  04/27/2021   Procedure: CORONARY ARTERY BYPASS GRAFTING (CABG) X  3 , USING LEFT INTERNAL MAMMARY ARTERY AND RIGHT ENDOSCOPIC GREATER SAPHENOUS VEIN CONDUITS;  Surgeon: Alleen Borne, MD;  Location: [facility] OR;  Service: Open Heart Surgery;  Laterality: N/A;   ENDOVEIN HARVEST OF GREATER SAPHENOUS VEIN Right 04/27/2021   Procedure: ENDOVEIN HARVEST OF GREATER  SAPHENOUS VEIN;  Surgeon: Alleen Borne, MD;  Location: [facility] OR;  Service: Open Heart Surgery;  Laterality: Right;   KNEE SURGERY Right    LUMBAR LAMINECTOMY/DECOMPRESSION MICRODISCECTOMY Bilateral 03/13/2022   Procedure: Lumbar One-Two, Lumbar Two-Three Decompressive lumbar laminectomy, medial facetectomy and foraminotomies with Right Lumbar One-Two Discetomy;  Surgeon: Tia Alert, MD;  Location: [facility] OR;  Service: Neurosurgery;  Laterality: Bilateral;   LUMBAR PERCUTANEOUS PEDICLE SCREW 1 LEVEL N/A 02/20/2018   Procedure: Lumbar Percutaneous Pedicle Screw Placment Lumbar four-five;  Surgeon: Maeola Harman, MD;  Location: [facility] OR;  Service: Neurosurgery;  Laterality: N/A;   POLYPECTOMY     RIGHT HEART CATH AND CORONARY ANGIOGRAPHY N/A 03/27/2021   Procedure: RIGHT HEART CATH AND CORONARY ANGIOGRAPHY;  Surgeon: Corky Crafts, MD;  Location: [facility] INVASIVE CV LAB;  Service: Cardiovascular;  Laterality: N/A;   TEE WITHOUT CARDIOVERSION N/A 04/27/2021   Procedure: TRANSESOPHAGEAL ECHOCARDIOGRAM (TEE);  Surgeon: Alleen Borne, MD;  Location: [facility] OR;  Service: Open Heart Surgery;  Laterality: N/A;   TONSILLECTOMY     TOTAL KNEE ARTHROPLASTY Right 06/08/2019   Procedure: RIGHT TOTAL KNEE ARTHROPLASTY;  Surgeon: Marcene Corning, MD;  Location: [facility]S;  Service: Orthopedics;  Laterality: Right;   XI ROBOTIC ASSISTED INGUINAL HERNIA REPAIR WITH MESH Bilateral 11/21/2020   Procedure: ROBOTIC BILATERAL INGUINAL HERNIA REPAIR WITH MESH;  Surgeon: Axel Filler, MD;  Location: [facility] OR;  Service: General;  Laterality: Bilateral;    No current facility-administered medications for this encounter.   Current Outpatient Medications  Medication Sig Dispense Refill Last Dose   acetaminophen (TYLENOL) 500 MG tablet Take 1,000 mg by mouth in the morning and at bedtime.      allopurinol (ZYLOPRIM) 300 MG tablet Take 300 mg by mouth in the morning.      ALPRAZolam (XANAX) 0.5 MG tablet Take 0.5 mg by mouth daily  as needed for anxiety.      amoxicillin (AMOXIL) 500 MG capsule SMARTSIG:4 Capsule(s) By Mouth Once      aspirin EC 325 MG EC tablet Take 1 tablet (325 mg total) by mouth daily. (Patient taking differently: Take 81 mg by mouth every evening.) 30 tablet 0    atorvastatin (LIPITOR) 40 MG tablet Take 40 mg by mouth every evening.       finasteride (PROSCAR) 5 MG tablet Take 5 mg by mouth at bedtime.      fluorouracil (EFUDEX) 5 % cream Apply 1 application topically daily as needed (sun damage).      latanoprost (XALATAN) 0.005 % ophthalmic solution Place 1 drop into both eyes at bedtime.      metoprolol tartrate (LOPRESSOR) 25 MG tablet Take 1 tablet (25 mg total) by mouth daily. 90 tablet 3    montelukast (SINGULAIR) 10 MG tablet Take 10 mg by mouth at bedtime.      potassium citrate (UROCIT-K) 10 MEQ (1080 MG) SR tablet Take 10 mEq by mouth 2 (two) times daily.      senna-docusate (SENOKOT-S) 8.6-50 MG tablet Take 2 tablets by mouth at bedtime.      sertraline (ZOLOFT) 100 MG tablet Take 100 mg by mouth every  evening.      tamsulosin (FLOMAX) 0.4 MG CAPS capsule Take 0.4 mg by mouth daily after breakfast.      triamcinolone (NASACORT) 55 MCG/ACT AERO nasal inhaler Place 1 spray into the nose in the morning.      zinc gluconate 50 MG tablet Take 50 mg by mouth daily.      Allergies  Allergen Reactions   Bee Pollen Cough    Social History   Tobacco Use   Smoking status: Former    Types: Cigarettes   Smokeless tobacco: Never  Substance Use Topics   Alcohol use: Not Currently    Family History  Problem Relation Age of Onset   Diabetes Father    Heart disease Father    Colon cancer Neg Hx    Colon polyps Neg Hx    Esophageal cancer Neg Hx    Rectal cancer Neg Hx    Stomach cancer Neg Hx      Review of Systems  Objective:  Physical Exam  Vital signs in last 24 hours:    Labs:   Estimated body mass index is 23.68 kg/m as calculated from the following:   Height as of  05/28/23: 5\' 10"  (1.778 m).   Weight as of 05/28/23: 74.8 kg.   Imaging Review Plain radiographs demonstrate severe degenerative joint disease of the left knee(s). The overall alignment isneutral. The bone quality appears to be good for age and reported activity level.      Assessment/Plan:  End stage primary arthritis, left knee   The patient history, physical examination, clinical judgment of the provider and imaging studies are consistent with end stage degenerative joint disease of the left knee(s) and total knee arthroplasty is deemed medically necessary. The treatment options including medical management, injection therapy arthroscopy and arthroplasty were discussed at length. The risks and benefits of total knee arthroplasty were presented and reviewed. The risks due to aseptic loosening, infection, stiffness, patella tracking problems, thromboembolic complications and other imponderables were discussed. The patient acknowledged the explanation, agreed to proceed with the plan and consent was signed. Patient is being admitted for inpatient treatment for surgery, pain control, PT, OT, prophylactic antibiotics, VTE prophylaxis, progressive ambulation and ADL's and discharge planning. The patient is planning to be discharged home with home health services  Patient's anticipated LOS is less than 2 midnights, meeting these requirements: - Younger than 92 - Lives within 1 hour of care - Has a competent adult at home to recover with post-op recover - NO history of  - Chronic pain requiring opiods  - Diabetes  - Coronary Artery Disease  - Heart failure  - Heart attack  - Stroke  - DVT/VTE  - Cardiac arrhythmia  - Respiratory Failure/COPD  - Renal failure  - Anemia  - Advanced Liver disease

## 2023-06-02 MED ORDER — SODIUM CHLORIDE 0.9 % IV SOLN
2000.0000 mg | INTRAVENOUS | Status: DC
  Filled 2023-06-02: qty 20

## 2023-06-03 ENCOUNTER — Ambulatory Visit (HOSPITAL_COMMUNITY): Payer: Medicare Other | Admitting: Anesthesiology

## 2023-06-03 ENCOUNTER — Ambulatory Visit (HOSPITAL_COMMUNITY)
Admission: RE | Admit: 2023-06-03 | Discharge: 2023-06-03 | Disposition: A | Discharge status: Home / Self Care | Payer: Medicare Other | Source: Ambulatory Visit | Attending: Orthopaedic Surgery | Admitting: Orthopaedic Surgery

## 2023-06-03 ENCOUNTER — Other Ambulatory Visit (HOSPITAL_COMMUNITY): Payer: Self-pay

## 2023-06-03 ENCOUNTER — Encounter (HOSPITAL_COMMUNITY): Payer: Self-pay | Admitting: Orthopaedic Surgery

## 2023-06-03 ENCOUNTER — Other Ambulatory Visit: Payer: Self-pay

## 2023-06-03 ENCOUNTER — Encounter (HOSPITAL_COMMUNITY)
Admission: RE | Disposition: A | Discharge status: Home / Self Care | Payer: Self-pay | Source: Ambulatory Visit | Attending: Orthopaedic Surgery

## 2023-06-03 ENCOUNTER — Ambulatory Visit (HOSPITAL_COMMUNITY): Payer: Medicare Other | Admitting: Medical

## 2023-06-03 DIAGNOSIS — F32A Depression, unspecified: Secondary | ICD-10-CM | POA: Insufficient documentation

## 2023-06-03 DIAGNOSIS — Z87891 Personal history of nicotine dependence: Secondary | ICD-10-CM | POA: Diagnosis not present

## 2023-06-03 DIAGNOSIS — R011 Cardiac murmur, unspecified: Secondary | ICD-10-CM | POA: Diagnosis not present

## 2023-06-03 DIAGNOSIS — I2510 Atherosclerotic heart disease of native coronary artery without angina pectoris: Secondary | ICD-10-CM | POA: Diagnosis not present

## 2023-06-03 DIAGNOSIS — F419 Anxiety disorder, unspecified: Secondary | ICD-10-CM | POA: Insufficient documentation

## 2023-06-03 DIAGNOSIS — M1712 Unilateral primary osteoarthritis, left knee: Secondary | ICD-10-CM | POA: Diagnosis present

## 2023-06-03 DIAGNOSIS — Z8673 Personal history of transient ischemic attack (TIA), and cerebral infarction without residual deficits: Secondary | ICD-10-CM | POA: Insufficient documentation

## 2023-06-03 DIAGNOSIS — G709 Myoneural disorder, unspecified: Secondary | ICD-10-CM | POA: Insufficient documentation

## 2023-06-03 HISTORY — PX: TOTAL KNEE ARTHROPLASTY: SHX125

## 2023-06-03 SURGERY — ARTHROPLASTY, KNEE, TOTAL
Anesthesia: Spinal | Site: Knee | Laterality: Left

## 2023-06-03 MED ORDER — TIZANIDINE HCL 4 MG PO TABS
4.0000 mg | ORAL_TABLET | Freq: Four times a day (QID) | ORAL | 1 refills | Status: DC | PRN
  Filled 2023-06-03: qty 40, 10d supply, fill #0

## 2023-06-03 MED ORDER — BUPIVACAINE-EPINEPHRINE (PF) 0.5% -1:200000 IJ SOLN
INTRAMUSCULAR | Status: AC
  Filled 2023-06-03: qty 30

## 2023-06-03 MED ORDER — ACETAMINOPHEN 10 MG/ML IV SOLN: 1000.0000 mg | Freq: Once | INTRAVENOUS | Status: DC | PRN

## 2023-06-03 MED ORDER — TRANEXAMIC ACID-NACL 1000-0.7 MG/100ML-% IV SOLN
1000.0000 mg | INTRAVENOUS | Status: AC
  Administered 2023-06-03: 1000 mg via INTRAVENOUS
  Filled 2023-06-03: qty 100

## 2023-06-03 MED ORDER — EPHEDRINE 5 MG/ML INJ
INTRAVENOUS | Status: AC
  Filled 2023-06-03: qty 20

## 2023-06-03 MED ORDER — LACTATED RINGERS IV BOLUS
250.0000 mL | Freq: Once | INTRAVENOUS | Status: AC
  Administered 2023-06-03: 250 mL via INTRAVENOUS

## 2023-06-03 MED ORDER — EPHEDRINE SULFATE (PRESSORS) 50 MG/ML IJ SOLN
INTRAMUSCULAR | Status: DC | PRN
  Administered 2023-06-03: 10 mg via INTRAVENOUS
  Administered 2023-06-03 (×2): 5 mg via INTRAVENOUS
  Administered 2023-06-03: 10 mg via INTRAVENOUS
  Administered 2023-06-03: 5 mg via INTRAVENOUS
  Administered 2023-06-03: 10 mg via INTRAVENOUS
  Administered 2023-06-03: 5 mg via INTRAVENOUS

## 2023-06-03 MED ORDER — EPHEDRINE 5 MG/ML INJ
INTRAVENOUS | Status: AC
  Filled 2023-06-03: qty 10

## 2023-06-03 MED ORDER — PROPOFOL 10 MG/ML IV BOLUS
INTRAVENOUS | Status: DC | PRN
Start: 2023-06-03 — End: 2023-06-03
  Administered 2023-06-03: 150 mg via INTRAVENOUS

## 2023-06-03 MED ORDER — POVIDONE-IODINE 10 % EX SWAB: 2.0000 | Freq: Once | CUTANEOUS | Status: DC

## 2023-06-03 MED ORDER — SODIUM CHLORIDE (PF) 0.9 % IJ SOLN
INTRAMUSCULAR | Status: DC | PRN
  Administered 2023-06-03: 30 mL

## 2023-06-03 MED ORDER — TRANEXAMIC ACID-NACL 1000-0.7 MG/100ML-% IV SOLN
1000.0000 mg | Freq: Once | INTRAVENOUS | Status: AC
  Administered 2023-06-03: 1000 mg via INTRAVENOUS

## 2023-06-03 MED ORDER — PHENYLEPHRINE HCL (PRESSORS) 10 MG/ML IV SOLN
INTRAVENOUS | Status: AC
  Filled 2023-06-03: qty 1

## 2023-06-03 MED ORDER — FENTANYL CITRATE (PF) 100 MCG/2ML IJ SOLN
INTRAMUSCULAR | Status: DC | PRN
  Administered 2023-06-03: 50 ug via INTRAVENOUS

## 2023-06-03 MED ORDER — DEXAMETHASONE SODIUM PHOSPHATE 10 MG/ML IJ SOLN
INTRAMUSCULAR | Status: AC
  Filled 2023-06-03: qty 1

## 2023-06-03 MED ORDER — LACTATED RINGERS IV BOLUS: 250.0000 mL | Freq: Once | INTRAVENOUS | Status: DC

## 2023-06-03 MED ORDER — FENTANYL CITRATE PF 50 MCG/ML IJ SOSY
50.0000 ug | PREFILLED_SYRINGE | INTRAMUSCULAR | Status: DC
  Administered 2023-06-03 (×2): 50 ug via INTRAVENOUS
  Filled 2023-06-03: qty 2

## 2023-06-03 MED ORDER — CEFAZOLIN SODIUM-DEXTROSE 2-4 GM/100ML-% IV SOLN
2.0000 g | INTRAVENOUS | Status: AC
  Administered 2023-06-03: 2 g via INTRAVENOUS
  Filled 2023-06-03: qty 100

## 2023-06-03 MED ORDER — LACTATED RINGERS IV SOLN: INTRAVENOUS | Status: DC

## 2023-06-03 MED ORDER — BUPIVACAINE-EPINEPHRINE 0.5% -1:200000 IJ SOLN
INTRAMUSCULAR | Status: DC | PRN
  Administered 2023-06-03: 30 mL

## 2023-06-03 MED ORDER — CHLORHEXIDINE GLUCONATE 0.12 % MT SOLN
15.0000 mL | Freq: Once | OROMUCOSAL | Status: AC
Start: 2023-06-03 — End: 2023-06-03
  Administered 2023-06-03: 15 mL via OROMUCOSAL

## 2023-06-03 MED ORDER — FENTANYL CITRATE (PF) 100 MCG/2ML IJ SOLN
INTRAMUSCULAR | Status: AC
  Filled 2023-06-03: qty 2

## 2023-06-03 MED ORDER — STERILE WATER FOR IRRIGATION IR SOLN
Status: DC | PRN
  Administered 2023-06-03: 1000 mL

## 2023-06-03 MED ORDER — HYDROCODONE-ACETAMINOPHEN 5-325 MG PO TABS
1.0000 | ORAL_TABLET | Freq: Four times a day (QID) | ORAL | 0 refills | Status: DC | PRN
  Filled 2023-06-03: qty 40, 5d supply, fill #0

## 2023-06-03 MED ORDER — BUPIVACAINE LIPOSOME 1.3 % IJ SUSP
INTRAMUSCULAR | Status: AC
  Filled 2023-06-03: qty 20

## 2023-06-03 MED ORDER — PHENYLEPHRINE HCL-NACL 20-0.9 MG/250ML-% IV SOLN
INTRAVENOUS | Status: DC | PRN
  Administered 2023-06-03: 40 ug/min via INTRAVENOUS

## 2023-06-03 MED ORDER — METHOCARBAMOL 500 MG PO TABS: 500.0000 mg | ORAL_TABLET | Freq: Four times a day (QID) | ORAL | Status: DC | PRN

## 2023-06-03 MED ORDER — ORAL CARE MOUTH RINSE: 15.0000 mL | Freq: Once | OROMUCOSAL | Status: AC

## 2023-06-03 MED ORDER — MIDAZOLAM HCL 2 MG/2ML IJ SOLN
1.0000 mg | INTRAMUSCULAR | Status: DC
  Filled 2023-06-03: qty 2

## 2023-06-03 MED ORDER — SODIUM CHLORIDE (PF) 0.9 % IJ SOLN
INTRAMUSCULAR | Status: AC
Start: 2023-06-03 — End: ?
  Filled 2023-06-03: qty 30

## 2023-06-03 MED ORDER — BUPIVACAINE LIPOSOME 1.3 % IJ SUSP
INTRAMUSCULAR | Status: DC | PRN
  Administered 2023-06-03: 20 mL

## 2023-06-03 MED ORDER — TRANEXAMIC ACID-NACL 1000-0.7 MG/100ML-% IV SOLN
INTRAVENOUS | Status: AC
  Filled 2023-06-03: qty 100

## 2023-06-03 MED ORDER — HYDROMORPHONE HCL 1 MG/ML IJ SOLN: 0.2500 mg | INTRAMUSCULAR | Status: DC | PRN

## 2023-06-03 MED ORDER — CEFAZOLIN SODIUM-DEXTROSE 2-4 GM/100ML-% IV SOLN
2.0000 g | Freq: Four times a day (QID) | INTRAVENOUS | Status: DC
Start: 2023-06-03 — End: 2023-06-03

## 2023-06-03 MED ORDER — CLONIDINE HCL (ANALGESIA) 100 MCG/ML EP SOLN
EPIDURAL | Status: DC | PRN
  Administered 2023-06-03: 100 ug

## 2023-06-03 MED ORDER — SODIUM CHLORIDE 0.9 % IV SOLN: 12.5000 mg | INTRAVENOUS | Status: DC | PRN

## 2023-06-03 MED ORDER — SODIUM CHLORIDE 0.9% IV SOLUTION
INTRAVENOUS | Status: DC | PRN
  Administered 2023-06-03: 1000 mL

## 2023-06-03 MED ORDER — ONDANSETRON HCL 4 MG/2ML IJ SOLN
INTRAMUSCULAR | Status: DC | PRN
Start: 2023-06-03 — End: 2023-06-03
  Administered 2023-06-03: 4 mg via INTRAVENOUS

## 2023-06-03 MED ORDER — TRANEXAMIC ACID 1000 MG/10ML IV SOLN
INTRAVENOUS | Status: DC | PRN
  Administered 2023-06-03: 2000 mg via TOPICAL

## 2023-06-03 MED ORDER — 0.9 % SODIUM CHLORIDE (POUR BTL) OPTIME
TOPICAL | Status: DC | PRN
  Administered 2023-06-03: 1000 mL

## 2023-06-03 MED ORDER — DEXAMETHASONE SODIUM PHOSPHATE 10 MG/ML IJ SOLN
INTRAMUSCULAR | Status: DC | PRN
  Administered 2023-06-03 (×2): 10 mg

## 2023-06-03 MED ORDER — ASPIRIN 81 MG PO TBEC
81.0000 mg | DELAYED_RELEASE_TABLET | Freq: Two times a day (BID) | ORAL | 0 refills | Status: DC
  Filled 2023-06-03: qty 45, 23d supply, fill #0

## 2023-06-03 MED ORDER — METHOCARBAMOL 1000 MG/10ML IJ SOLN: 500.0000 mg | Freq: Four times a day (QID) | INTRAMUSCULAR | Status: DC | PRN

## 2023-06-03 MED ORDER — BUPIVACAINE LIPOSOME 1.3 % IJ SUSP: 20.0000 mL | Freq: Once | INTRAMUSCULAR | Status: DC

## 2023-06-03 MED ORDER — ONDANSETRON HCL 4 MG/2ML IJ SOLN
INTRAMUSCULAR | Status: AC
  Filled 2023-06-03: qty 2

## 2023-06-03 MED ORDER — ROPIVACAINE HCL 5 MG/ML IJ SOLN
INTRAMUSCULAR | Status: DC | PRN
  Administered 2023-06-03 (×6): 5 mL via PERINEURAL

## 2023-06-03 MED ORDER — LACTATED RINGERS IV BOLUS
500.0000 mL | Freq: Once | INTRAVENOUS | Status: AC
  Administered 2023-06-03: 500 mL via INTRAVENOUS

## 2023-06-03 SURGICAL SUPPLY — 54 items
ATTUNE MED DOME PAT 41 KNEE (Knees) IMPLANT
ATTUNE PS FEM LT SZ 6 CEM KNEE (Femur) IMPLANT
ATTUNE PSRP INSR SZ6 5 KNEE (Insert) IMPLANT
BAG COUNTER SPONGE SURGICOUNT (BAG) ×1 IMPLANT
BAG DECANTER FOR FLEXI CONT (MISCELLANEOUS) ×1 IMPLANT
BAG ZIPLOCK 12X15 (MISCELLANEOUS) ×1 IMPLANT
BASE TIBIAL ROT PLAT SZ 8 KNEE (Knees) IMPLANT
BLADE SAG 13.0X1.37X90 (BLADE) IMPLANT
BLADE SAGITTAL 25.0X1.19X90 (BLADE) ×1 IMPLANT
BLADE SAW SGTL 11.0X1.19X90.0M (BLADE) ×1 IMPLANT
BLADE SURG SZ10 CARB STEEL (BLADE) ×1 IMPLANT
BNDG ELASTIC 6INX 5YD STR LF (GAUZE/BANDAGES/DRESSINGS) ×1 IMPLANT
BOOTIES KNEE HIGH SLOAN (MISCELLANEOUS) ×1 IMPLANT
BOWL SMART MIX CTS (DISPOSABLE) ×1 IMPLANT
CEMENT HV SMART SET (Cement) ×2 IMPLANT
COVER SURGICAL LIGHT HANDLE (MISCELLANEOUS) ×1 IMPLANT
CUFF TOURN SGL QUICK 34 (TOURNIQUET CUFF) ×1
CUFF TRNQT CYL 34X4.125X (TOURNIQUET CUFF) ×1 IMPLANT
DRAPE TOP 10253 STERILE (DRAPES) ×1 IMPLANT
DRAPE U-SHAPE 47X51 STRL (DRAPES) ×1 IMPLANT
DRSG AQUACEL AG ADV 3.5X10 (GAUZE/BANDAGES/DRESSINGS) ×1 IMPLANT
DURAPREP 26ML APPLICATOR (WOUND CARE) ×2 IMPLANT
ELECT REM PT RETURN 15FT ADLT (MISCELLANEOUS) ×1 IMPLANT
GLOVE BIO SURGEON STRL SZ8 (GLOVE) ×2 IMPLANT
GLOVE BIOGEL PI IND STRL 7.0 (GLOVE) ×1 IMPLANT
GLOVE BIOGEL PI IND STRL 8 (GLOVE) ×2 IMPLANT
GLOVE SURG SYN 7.0 (GLOVE) ×1 IMPLANT
GLOVE SURG SYN 7.0 PF PI (GLOVE) ×1 IMPLANT
GOWN SRG XL LVL 4 BRTHBL STRL (GOWNS) ×1 IMPLANT
GOWN STRL NON-REIN XL LVL4 (GOWNS) ×1
GOWN STRL REUS W/ TWL XL LVL3 (GOWN DISPOSABLE) ×2 IMPLANT
GOWN STRL REUS W/TWL XL LVL3 (GOWN DISPOSABLE) ×2
HANDPIECE INTERPULSE COAX TIP (DISPOSABLE) ×1
HOLDER FOLEY CATH W/STRAP (MISCELLANEOUS) IMPLANT
HOOD PEEL AWAY T7 (MISCELLANEOUS) ×3 IMPLANT
KIT TURNOVER KIT A (KITS) IMPLANT
MANIFOLD NEPTUNE II (INSTRUMENTS) ×1 IMPLANT
NS IRRIG 1000ML POUR BTL (IV SOLUTION) ×1 IMPLANT
PACK TOTAL KNEE CUSTOM (KITS) ×1 IMPLANT
PAD ARMBOARD 7.5X6 YLW CONV (MISCELLANEOUS) ×1 IMPLANT
PIN STEINMAN FIXATION KNEE (PIN) IMPLANT
PROTECTOR NERVE ULNAR (MISCELLANEOUS) ×1 IMPLANT
SET HNDPC FAN SPRY TIP SCT (DISPOSABLE) ×1 IMPLANT
SPIKE FLUID TRANSFER (MISCELLANEOUS) ×2 IMPLANT
SUT ETHIBOND NAB CT1 #1 30IN (SUTURE) ×1 IMPLANT
SUT VIC AB 0 CT1 36 (SUTURE) ×1 IMPLANT
SUT VIC AB 2-0 CT1 TAPERPNT 27 (SUTURE) ×1 IMPLANT
SUT VICRYL AB 3-0 FS1 BRD 27IN (SUTURE) ×1 IMPLANT
SUT VLOC 180 0 24IN GS25 (SUTURE) ×1 IMPLANT
TIBIAL BASE ROT PLAT SZ 8 KNEE (Knees) ×1 IMPLANT
TRAY FOLEY MTR SLVR 16FR STAT (SET/KITS/TRAYS/PACK) IMPLANT
WATER STERILE IRR 1000ML POUR (IV SOLUTION) ×2 IMPLANT
WRAP KNEE MAXI GEL POST OP (GAUZE/BANDAGES/DRESSINGS) ×1 IMPLANT
YANKAUER SUCT BULB TIP NO VENT (SUCTIONS) ×1 IMPLANT

## 2023-06-03 NOTE — Anesthesia Procedure Notes (Signed)
Procedure Name: LMA Insertion Date/Time: 06/03/2023 10:23 AM  Performed by: Donna Bernard, CRNAPre-anesthesia Checklist: Patient identified, Emergency Drugs available, Suction available, Patient being monitored and Timeout performed Patient Re-evaluated:Patient Re-evaluated prior to induction Preoxygenation: Pre-oxygenation with 100% oxygen Induction Type: IV induction Ventilation: Mask ventilation without difficulty LMA: LMA inserted LMA Size: 5.0 Number of attempts: 1 Tube secured with: Tape Dental Injury: Teeth and Oropharynx as per pre-operative assessment

## 2023-06-03 NOTE — Op Note (Addendum)
PREOP DIAGNOSIS: DJD LEFT KNEE POSTOP DIAGNOSIS:  same PROCEDURE: LEFT TKR ANESTHESIA: General and block ATTENDING SURGEON: Velna Ochs ASSISTANT: Elodia Florence PA  INDICATIONS FOR PROCEDURE: Nicholas Wise is a 77 y.o. male who has struggled for a long time with pain due to degenerative arthritis of the left knee.  The patient has failed many conservative non-operative measures and at this point has pain which limits the ability to sleep and walk.  The patient is offered total knee replacement.  Informed operative consent was obtained after discussion of possible risks of anesthesia, infection, neurovascular injury, DVT, and death.  The importance of the post-operative rehabilitation protocol to optimize result was stressed extensively with the patient.  SUMMARY OF FINDINGS AND PROCEDURE:  Nicholas Wise was taken to the operative suite where under the above anesthesia a left knee replacement was performed.  There were advanced degenerative changes and the bone quality was excellent.  We used the DePuyAttune system and placed size 6 femur, 8 tibia, 41 mm all polyethylene patella, and a size 5 mm spacer.  Elodia Florence PA-C assisted throughout and was invaluable to the completion of the case in that he helped retract and maintain exposure while I placed the components.  He also helped close thereby minimizing OR time.  The patient was admitted for appropriate post-op care to include perioperative antibiotics and mechanical and pharmacologic measures for DVT prophylaxis.  DESCRIPTION OF PROCEDURE:  Nicholas Wise was taken to the operative suite where the above anesthesia was applied.  The patient was positioned supine and prepped and draped in normal sterile fashion.  An appropriate time out was performed.  After the administration of kefzol pre-op antibiotic the leg was elevated and exsanguinated and a tourniquet inflated.  A standard longitudinal incision was made on the anterior knee.  Dissection was  carried down to the extensor mechanism.  All appropriate anti-infective measures were used including the pre-operative antibiotic, betadine impregnated drape, and closed hooded exhaust systems for each member of the surgical team.  A medial parapatellar incision was made in the extensor mechanism and the knee cap flipped and the knee flexed.  Some residual meniscal tissues were removed along with any remaining ACL/PCL tissue.  A guide was placed on the tibia and a flat cut was made on it's superior surface.  An intramedullary guide was placed in the femur and was utilized to make anterior and posterior cuts creating an appropriate flexion gap.  A second intramedullary guide was placed in the femur to make a distal cut properly balancing the knee with an extension gap equal to the flexion gap.  The three bones sized to the above mentioned sizes and the appropriate guides were placed and utilized.  A trial reduction was done and the knee easily came to full extension and the patella tracked well on flexion.  The trial components were removed and all bones were cleaned with pulsatile lavage and then dried thoroughly.  Cement was mixed and was pressurized onto the bones followed by placement of the aforementioned components.  Excess cement was trimmed and pressure was held on the components until the cement had hardened.  The tourniquet was deflated and a small amount of bleeding was controlled with cautery and pressure.  The knee was irrigated thoroughly.  The extensor mechanism was re-approximated with #1 ethibond in interrupted fashion.  The knee was flexed and the repair was solid.  The subcutaneous tissues were re-approximated with #0 and #2-0 vicryl and the skin  closed with a subcuticular stitch and steristrips.  A sterile dressing was applied.  Intraoperative fluids, EBL, and tourniquet time can be obtained from anesthesia records.  DISPOSITION:  The patient was taken to recovery room in stable condition and  scheduled to potentially go home same day depending on ability to walk and tolerate liquids.  Velna Ochs 06/03/2023, 11:35 AM

## 2023-06-03 NOTE — Evaluation (Signed)
Physical Therapy Evaluation Patient Details Name: Nicholas Wise MRN: [id number] DOB: 1945-08-05 Today's Date: 06/03/2023  History of Present Illness  77  yo male presents to therapy s/p L TKA on 06/03/2023 due to failure of conservative measures. Pt PMH includes but is not limited to: lumbar laminectomy, anemia, HTN, PVCs, s/p CABG x 3, aortic valve replacement, CAD, cervical myelopathy and spondylosis s/p ACDF, GAD, HLD, TIA, and R TKA (2020).  Clinical Impression   Nicholas Wise is a 77 y.o. male POD 0 s/p L TKA. Patient reports IND with mobility at baseline. Patient is now limited by functional impairments (see PT problem list below) and requires CGA and cues for transfers and gait with RW. Patient was able to ambulate 50 feet with RW and CGA and cues for safe walker management. Patient educated on safe sequencing for stair mobility with use of RW, fall risk prevention, slowly increasing activity levels, use of RW for safety and stability vs rollator, pain management and goal, use of CP/ice man machine and car transfers pt and spouse verbalized understanding of  safe guarding position for people assisting with mobility. Patient instructed in exercises to facilitate ROM and circulation reviewed and HO provided. Patient will benefit from continued skilled PT interventions to address impairments and progress towards PLOF. Patient has met mobility goals at adequate level for discharge home with family support and OPPT services scheduled for 11/21; will continue to follow if pt continues acute stay to progress towards Mod I goals.       If plan is discharge home, recommend the following: A little help with walking and/or transfers;A little help with bathing/dressing/bathroom;Assistance with cooking/housework;Assist for transportation;Help with stairs or ramp for entrance   Can travel by private vehicle        Equipment Recommendations None recommended by PT  Recommendations for Other Services        Functional Status Assessment Patient has had a recent decline in their functional status and demonstrates the ability to make significant improvements in function in a reasonable and predictable amount of time.     Precautions / Restrictions Precautions Precautions: Knee;Fall Restrictions Weight Bearing Restrictions: No      Mobility  Bed Mobility Overal bed mobility: Needs Assistance Bed Mobility: Supine to Sit     Supine to sit: Supervision, HOB elevated     General bed mobility comments: min cues    Transfers Overall transfer level: Needs assistance Equipment used: Rolling walker (2 wheels) Transfers: Sit to/from Stand Sit to Stand: Contact guard assist           General transfer comment: min cues for safety    Ambulation/Gait Ambulation/Gait assistance: Contact guard assist Gait Distance (Feet): 50 Feet Assistive device: Rolling walker (2 wheels) Gait Pattern/deviations: Step-to pattern, Trunk flexed, Decreased stance time - left Gait velocity: decreased     General Gait Details: pt reported no pain with eval today, pt demonstrated step almost through pattern with min cues for safety and sequencing  Stairs Stairs: Yes Stairs assistance: Contact guard assist Stair Management: Two rails Number of Stairs: 3 General stair comments: min cues for safety, technique with step to pattern and B handrails. pt ed provided on use of RW to navigate one step to enter home with pt verbalizing understanding  Wheelchair Mobility     Tilt Bed    Modified Rankin (Stroke Patients Only)       Balance Overall balance assessment: Needs assistance Sitting-balance support: Feet supported Sitting balance-Leahy Scale: Good  Standing balance support: Bilateral upper extremity supported, During functional activity, Reliant on assistive device for balance Standing balance-Leahy Scale: Fair Standing balance comment: static standing no UE support                              Pertinent Vitals/Pain Pain Assessment Pain Assessment: 0-10 Pain Score: 0-No pain Pain Location: L knee and LE Pain Intervention(s): Limited activity within patient's tolerance, Monitored during session, Premedicated before session, Repositioned, Ice applied    Home Living Family/patient expects to be discharged to:: Private residence Living Arrangements: Spouse/significant other Available Help at Discharge: Family Type of Home: House Home Access: Stairs to enter Entrance Stairs-Rails: None Entrance Stairs-Number of Steps: 1   Home Layout: One level Home Equipment: Cane - single point;Rollator (4 wheels);Rolling Walker (2 wheels)      Prior Function Prior Level of Function : Independent/Modified Independent             Mobility Comments: IND no AD for all ADLs, self care tasks and IADLs       Extremity/Trunk Assessment        Lower Extremity Assessment Lower Extremity Assessment: LLE deficits/detail LLE Deficits / Details: ankle DF/PF 5/5; SLR , 10 degree lag LLE Sensation: WNL    Cervical / Trunk Assessment Cervical / Trunk Assessment: Back Surgery;Neck Surgery  Communication   Communication Communication: No apparent difficulties  Cognition Arousal: Alert Behavior During Therapy: WFL for tasks assessed/performed Overall Cognitive Status: Within Functional Limits for tasks assessed                                          General Comments      Exercises Total Joint Exercises Ankle Circles/Pumps: AROM, Both, 15 reps Quad Sets: AROM, Left, 5 reps Short Arc Quad: AROM, Left, 5 reps Heel Slides: AROM, Left, 5 reps Hip ABduction/ADduction: AROM, Left, 5 reps Straight Leg Raises: AROM, Left, 5 reps Knee Flexion: AROM, Left, 5 reps, Seated   Assessment/Plan    PT Assessment Patient needs continued PT services  PT Problem List Decreased strength;Decreased range of motion;Decreased activity tolerance;Decreased  balance;Decreased mobility;Decreased coordination       PT Treatment Interventions DME instruction;Gait training;Stair training;Functional mobility training;Therapeutic activities;Therapeutic exercise;Balance training;Neuromuscular re-education;Patient/family education;Modalities    PT Goals (Current goals can be found in the Care Plan section)  Acute Rehab PT Goals Patient Stated Goal: to be able to do yard work including weedeating and leaves PT Goal Formulation: With patient Time For Goal Achievement: 06/23/23 Potential to Achieve Goals: Good    Frequency 7X/week     Co-evaluation               AM-PAC PT "6 Clicks" Mobility  Outcome Measure Help needed turning from your back to your side while in a flat bed without using bedrails?: None Help needed moving from lying on your back to sitting on the side of a flat bed without using bedrails?: A Little Help needed moving to and from a bed to a chair (including a wheelchair)?: A Little Help needed standing up from a chair using your arms (e.g., wheelchair or bedside chair)?: A Little Help needed to walk in hospital room?: A Little Help needed climbing 3-5 steps with a railing? : A Little 6 Click Score: 19    End of Session Equipment Utilized During Treatment: Gait  belt Activity Tolerance: Patient tolerated treatment well;No increased pain Patient left: in chair;with call bell/phone within reach;with family/visitor present;with nursing/sitter in room Nurse Communication: Mobility status;Other (comment) (pt readiness for d/c from PT standpoint) PT Visit Diagnosis: Unsteadiness on feet (R26.81);Other abnormalities of gait and mobility (R26.89);Muscle weakness (generalized) (M62.81);Difficulty in walking, not elsewhere classified (R26.2);Pain Pain - Right/Left: Left Pain - part of body: Knee;Leg    Time: 4401-0272 PT Time Calculation (min) (ACUTE ONLY): 44 min   Charges:   PT Evaluation $PT Eval Low Complexity: 1 Low PT  Treatments $Gait Training: 8-22 mins $Therapeutic Exercise: 8-22 mins PT General Charges $$ ACUTE PT VISIT: 1 Visit         Johnny Bridge, PT Acute Rehab   Jacqualyn Posey 06/03/2023, 3:50 PM

## 2023-06-03 NOTE — Interval H&P Note (Signed)
History and Physical Interval Note:  06/03/2023 9:01 AM  Nicholas Wise  has presented today for surgery, with the diagnosis of LEFT KNEE DEGENERATIVE JOINT DISEASE.  The various methods of treatment have been discussed with the patient and family. After consideration of risks, benefits and other options for treatment, the patient has consented to  Procedure(s): LEFT TOTAL KNEE ARTHROPLASTY (Left) as a surgical intervention.  The patient's history has been reviewed, patient examined, no change in status, stable for surgery.  I have reviewed the patient's chart and labs.  Questions were answered to the patient's satisfaction.     Velna Ochs

## 2023-06-03 NOTE — Transfer of Care (Signed)
Immediate Anesthesia Transfer of Care Note  Patient: Nicholas Wise  Procedure(s) Performed: LEFT TOTAL KNEE ARTHROPLASTY (Left: Knee)  Patient Location: PACU  Anesthesia Type:General  Level of Consciousness: sedated, drowsy, and patient cooperative  Airway & Oxygen Therapy: Patient connected to face mask oxygen  Post-op Assessment: Report given to RN and Post -op Vital signs reviewed and stable  Post vital signs: stable  Last Vitals:  Vitals Value Taken Time  BP 117/56 06/03/23 1209  Temp    Pulse 72 06/03/23 1210  Resp 15 06/03/23 1210  SpO2 100 % 06/03/23 1210  Vitals shown include unfiled device data.  Last Pain:  Vitals:   06/03/23 0855  TempSrc:   PainSc: Asleep         Complications: No notable events documented.

## 2023-06-03 NOTE — Anesthesia Postprocedure Evaluation (Signed)
Anesthesia Post Note  Patient: Connelly Tristan Abbasi  Procedure(s) Performed: LEFT TOTAL KNEE ARTHROPLASTY (Left: Knee)     Patient location during evaluation: PACU Anesthesia Type: General Level of consciousness: awake Pain management: pain level controlled Vital Signs Assessment: post-procedure vital signs reviewed and stable Respiratory status: spontaneous breathing Cardiovascular status: stable Postop Assessment: no apparent nausea or vomiting Anesthetic complications: no  No notable events documented.  Last Vitals:  Vitals:   06/03/23 1215 06/03/23 1230  BP: (!) 118/54 (!) 108/51  Pulse: 72 78  Resp: 13 15  Temp:    SpO2: 100% 93%    Last Pain:  Vitals:   06/03/23 1230  TempSrc:   PainSc: 0-No pain    LLE Motor Response: Purposeful movement (06/03/23 1230) LLE Sensation: Decreased (06/03/23 1230) RLE Motor Response: Purposeful movement (06/03/23 1230) RLE Sensation: Decreased (06/03/23 1230) L Sensory Level: S3-Medial thigh (06/03/23 1230) R Sensory Level: S3-Medial thigh (06/03/23 1230)  Caren Macadam

## 2023-06-03 NOTE — Anesthesia Procedure Notes (Addendum)
Anesthesia Regional Block: Adductor canal block   Pre-Anesthetic Checklist: , timeout performed,  Correct Patient, Correct Site, Correct Laterality,  Correct Procedure, Correct Position, site marked,  Risks and benefits discussed,  Surgical consent,  Pre-op evaluation,  At surgeon's request and post-op pain management  Laterality: Lower and Left  Prep: chloraprep       Needles:  Injection technique: Single-shot  Needle Type: Echogenic Stimulator Needle     Needle Length: 9cm  Needle Gauge: 20   Needle insertion depth: 2 cm   Additional Needles:   Procedures:,,,, ultrasound used (permanent image in chart),,    Narrative:  Start time: 06/03/2023 8:30 AM End time: 06/03/2023 8:39 AM Injection made incrementally with aspirations every 5 mL.  Performed by: Personally  Anesthesiologist: Leilani Able, MD

## 2023-06-03 NOTE — Anesthesia Procedure Notes (Signed)
Spinal  Patient location during procedure: OR Start time: 06/03/2023 10:10 AM End time: 06/03/2023 10:20 AM Reason for block: surgical anesthesia Staffing Performed: anesthesiologist  Anesthesiologist: Leilani Able, MD Performed by: Leilani Able, MD Authorized by: Leilani Able, MD   Preanesthetic Checklist Completed: patient identified, IV checked, site marked, risks and benefits discussed, surgical consent, monitors and equipment checked, pre-op evaluation and timeout performed Spinal Block Patient position: sitting Prep: DuraPrep and site prepped and draped Patient monitoring: continuous pulse ox and blood pressure Approach: midline Location: L3-4 Injection technique: single-shot Needle Needle type: Pencan  Needle gauge: 24 G Needle length: 10 cm Needle insertion depth: 6 cm Assessment Sensory level: T4 Additional Notes Failed as it did for his right knee. Patient has had lumbar spine surgery

## 2023-06-04 ENCOUNTER — Encounter (HOSPITAL_COMMUNITY): Payer: Self-pay | Admitting: Orthopaedic Surgery

## 2023-06-05 ENCOUNTER — Other Ambulatory Visit: Payer: Self-pay | Admitting: Nurse Practitioner

## 2023-08-06 LAB — LAB REPORT - SCANNED
EGFR (Non-African Amer.): 93
PSA, Total: 1.99

## 2023-10-15 NOTE — Progress Notes (Unsigned)
 Cardiology Office Note:   Date:  10/17/2023  ID:  Nicholas Wise, DOB [birth year], MRN [id number] PCP:  Nicholas Boss, MD  [facility] Providers Cardiologist:  Alverda Skeans, MD Referring MD: Nicholas Boss, MD  Chief Complaint/Reason for Referral: Follow-up for CAD ASSESSMENT:    1. S/P CABG x 3   2. S/P aortic valve replacement with bioprosthetic valve   3. Hyperlipidemia LDL goal <55   4. Aortic atherosclerosis (HCC)   5. TIA (transient ischemic attack)   6. Bilateral carotid artery disease, unspecified type (HCC)     PLAN:   In order of problems listed above: Status post CABG: Continue aspirin 81 mg, atorvastatin 40 mg.  Will obtain echocardiogram to inform need for beta-blocker. Status post aortic valve replacement: Continue aspirin 81 mg; obtain echocardiogram to evaluate bioprosthetic aortic valve function and ejection fraction. Hyperlipidemia: LDL was 57 recently on outside labs.  Given history of stroke LDL goal is less than 55. Aortic atherosclerosis: Continue aspirin 81 mg, atorvastatin 40 mg, and strict blood pressure control. History of TIA: Continue aspirin 81 mg, atorvastatin 40 mg, and control cardiovascular risk factors. Carotid disease: Will refer for carotid ultrasound and likely referral to vascular surgery for ongoing surveillance and management.           Dispo:  No follow-ups on file.      Medication Adjustments/Labs and Tests Ordered: Current medicines are reviewed at length with the patient today.  Concerns regarding medicines are outlined above.  The following changes have been made:  no change   Labs/tests ordered: Orders Placed This Encounter  Procedures   ECHOCARDIOGRAM COMPLETE   VAS US CAROTID    Medication Changes: No orders of the defined types were placed in this encounter.   Current medicines are reviewed at length with the patient today.  The patient does not have concerns regarding medicines.  I spent 35 minutes reviewing all  clinical data during and prior to this visit including all relevant imaging studies, laboratories, clinical information from other health systems and prior notes from both Cardiology and other specialties, interviewing the patient, conducting a complete physical examination, and coordinating care in order to formulate a comprehensive and personalized evaluation and treatment plan.   History of Present Illness:      FOCUSED PROBLEM LIST:   CAD CABG LIMA to LAD, VG to DG, VG to PDA + 25 mm Inspiris Resilia AVR 2022 AS 25 mm Inspiris Resilia AVR 2022 Hyperlipidemia Aortic atherosclerosis Chest CT 2022 TIA 2015 Carotid disease Occluded right ICA; left ICA 40 to 59% ultrasound 2024 RBBB  April 2025:  Patient consents to use of AI scribe. The patient returns for routine follow-up.  He was seen in April 2024 and was doing well.  His blood pressure that visit was mildly elevated.  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  He had moderate disease of the left ICA.  No recent hospitalizations or emergency room visits. No chest pain or breathing difficulties. He is on aspirin and bruises easily with minor trauma but does not experience spontaneous bleeding. No associated joint or muscle aches.  He has had no issues with his medications recently.  He had his LDL drawn within the last few months which was 57.  He has some issues with metoprolol and would like to be off of this if possible.       Current Medications: Current Meds  Medication Sig  aspirin EC 81 MG tablet Take 1 tablet (81 mg total) by mouth 2 (two) times daily after a meal. For 2 weeks then once a day for 2 weeks for DVT prevention   atorvastatin (LIPITOR) 40 MG tablet Take 40 mg by mouth every evening.    finasteride (PROSCAR) 5 MG tablet Take 5 mg by mouth at bedtime.   fluorouracil (EFUDEX) 5 % cream Apply 1 application topically daily as  needed (sun damage).   HYDROcodone-acetaminophen (NORCO/VICODIN) 5-325 MG tablet Take 1-2 tablets by mouth every 6 (six) hours as needed for moderate pain (pain score 4-6).   latanoprost (XALATAN) 0.005 % ophthalmic solution Place 1 drop into both eyes at bedtime.   metoprolol tartrate (LOPRESSOR) 25 MG tablet Take 1 tablet (25 mg total) by mouth daily.   montelukast (SINGULAIR) 10 MG tablet Take 10 mg by mouth at bedtime.   potassium citrate (UROCIT-K) 10 MEQ (1080 MG) SR tablet Take 10 mEq by mouth 2 (two) times daily.   senna-docusate (SENOKOT-S) 8.6-50 MG tablet Take 2 tablets by mouth at bedtime.   sertraline (ZOLOFT) 100 MG tablet Take 100 mg by mouth every evening.   tamsulosin (FLOMAX) 0.4 MG CAPS capsule Take 0.4 mg by mouth daily after breakfast.   tiZANidine (ZANAFLEX) 4 MG tablet Take 1 tablet (4 mg total) by mouth every 6 (six) hours as needed for muscle spasms.   triamcinolone (NASACORT) 55 MCG/ACT AERO nasal inhaler Place 1 spray into the nose in the morning.   zinc gluconate 50 MG tablet Take 50 mg by mouth daily.     Review of Systems:   Please see the history of present illness.    All other systems reviewed and are negative.     EKGs/Labs/Other Test Reviewed:   EKG: 2024 sinus rhythm with right bundle branch block  EKG Interpretation Date/Time:    Ventricular Rate:    PR Interval:    QRS Duration:    QT Interval:    QTC Calculation:   R Axis:      Text Interpretation:           Risk Assessment/Calculations:          Physical Exam:   VS:  BP 122/60 (BP Location: Right Arm, Patient Position: Sitting, Cuff Size: Normal)   Pulse 62   Ht 5\' 10"  (1.778 m)   Wt 171 lb (77.6 kg)   SpO2 95%   BMI 24.54 kg/m        Wt Readings from Last 3 Encounters:  10/17/23 171 lb (77.6 kg)  06/03/23 165 lb (74.8 kg)  05/28/23 165 lb (74.8 kg)      GENERAL:  No apparent distress, AOx3 HEENT:  No carotid bruits, +2 carotid impulses, no scleral icterus CAR: RRR no  murmurs, gallops, rubs, or thrills RES:  Clear to auscultation bilaterally ABD:  Soft, nontender, nondistended, positive bowel sounds x 4 VASC:  +2 radial pulses, +2 carotid pulses NEURO:  CN 2-12 grossly intact; motor and sensory grossly intact PSYCH:  No active depression or anxiety EXT:  No edema, ecchymosis, or cyanosis  Signed, Orbie Pyo, MD  10/17/2023 2:16 PM    [facility] [address] [place], [place], [place]  [address] Phone: [phone]; Fax: [phone]   Note:  This document was prepared using Dragon voice recognition software and may include unintentional dictation errors.

## 2023-10-17 ENCOUNTER — Ambulatory Visit: Attending: Internal Medicine | Admitting: Internal Medicine

## 2023-10-17 ENCOUNTER — Encounter: Payer: Self-pay | Admitting: Internal Medicine

## 2023-10-17 VITALS — BP 122/60 | HR 62 | Ht 70.0 in | Wt 171.0 lb

## 2023-10-17 DIAGNOSIS — E785 Hyperlipidemia, unspecified: Secondary | ICD-10-CM | POA: Diagnosis not present

## 2023-10-17 DIAGNOSIS — G459 Transient cerebral ischemic attack, unspecified: Secondary | ICD-10-CM | POA: Insufficient documentation

## 2023-10-17 DIAGNOSIS — I779 Disorder of arteries and arterioles, unspecified: Secondary | ICD-10-CM | POA: Insufficient documentation

## 2023-10-17 DIAGNOSIS — I700 Atherosclerosis of aorta: Secondary | ICD-10-CM | POA: Insufficient documentation

## 2023-10-17 DIAGNOSIS — Z951 Presence of aortocoronary bypass graft: Secondary | ICD-10-CM | POA: Diagnosis present

## 2023-10-17 DIAGNOSIS — Z953 Presence of xenogenic heart valve: Secondary | ICD-10-CM | POA: Diagnosis present

## 2023-10-17 NOTE — Patient Instructions (Signed)
 Medication Instructions:  Your physician recommends that you continue on your current medications as directed. Please refer to the Current Medication list given to you today.  *If you need a refill on your cardiac medications before your next appointment, please call your pharmacy*  Testing/Procedures: Your physician has requested that you have an echocardiogram. Echocardiography is a painless test that uses sound waves to create images of your heart. It provides your doctor with information about the size and shape of your heart and how well your heart's chambers and valves are working. This procedure takes approximately one hour. There are no restrictions for this procedure. Please do NOT wear cologne, perfume, aftershave, or lotions (deodorant is allowed). Please arrive 15 minutes prior to your appointment time.  Please note: We ask at that you not bring children with you during ultrasound (echo/ vascular) testing. Due to room size and safety concerns, children are not allowed in the ultrasound rooms during exams. Our front office staff cannot provide observation of children in our lobby area while testing is being conducted. An adult accompanying a patient to their appointment will only be allowed in the ultrasound room at the discretion of the ultrasound technician under special circumstances. We apologize for any inconvenience.  Your physician has requested that you have a carotid duplex. This test is an ultrasound of the carotid arteries in your neck. It looks at blood flow through these arteries that supply the brain with blood. Allow one hour for this exam. There are no restrictions or special instructions.  Follow-Up: At [facility], you and your health needs are our priority.  As part of our continuing mission to provide you with exceptional heart care, we have created designated Provider Care Teams.  These Care Teams include your primary Cardiologist (physician) and Advanced Practice  Providers (APPs -  Physician Assistants and Nurse Practitioners) who all work together to provide you with the care you need, when you need it.  We recommend signing up for the patient portal called "MyChart".  Sign up information is provided on this After Visit Summary.  MyChart is used to connect with patients for Virtual Visits (Telemedicine).  Patients are able to view lab/test results, encounter notes, upcoming appointments, etc.  Non-urgent messages can be sent to your provider as well.   To learn more about what you can do with MyChart, go to [email or web address].    Your next appointment:   1 year(s)  The format for your next appointment:   In Person  Provider:   Orbie Pyo, MD {  Other Instructions   1st Floor: - Lobby - Registration  - Pharmacy  - Lab - Cafe  2nd Floor: - PV Lab - Diagnostic Testing (echo, CT, nuclear med)  3rd Floor: - Vacant  4th Floor: - TCTS (cardiothoracic surgery) - AFib Clinic - Structural Heart Clinic - Vascular Surgery  - Vascular Ultrasound  5th Floor: - [facility] (general and EP) - Clinical Pharmacy for coumadin, hypertension, lipid, weight-loss medications, and med management appointments    Valet parking services will be available as well.

## 2023-10-20 ENCOUNTER — Ambulatory Visit: Payer: Medicare Other | Admitting: Cardiovascular Disease

## 2023-11-28 ENCOUNTER — Ambulatory Visit (HOSPITAL_BASED_OUTPATIENT_CLINIC_OR_DEPARTMENT_OTHER)
Admission: RE | Admit: 2023-11-28 | Discharge: 2023-11-28 | Disposition: A | Discharge status: Home / Self Care | Source: Ambulatory Visit | Attending: Internal Medicine | Admitting: Internal Medicine

## 2023-11-28 ENCOUNTER — Ambulatory Visit (HOSPITAL_COMMUNITY)
Admission: RE | Admit: 2023-11-28 | Discharge: 2023-11-28 | Disposition: A | Discharge status: Home / Self Care | Source: Ambulatory Visit | Attending: Internal Medicine | Admitting: Internal Medicine

## 2023-11-28 ENCOUNTER — Ambulatory Visit: Payer: Self-pay | Admitting: Internal Medicine

## 2023-11-28 DIAGNOSIS — I779 Disorder of arteries and arterioles, unspecified: Secondary | ICD-10-CM

## 2023-11-28 DIAGNOSIS — Z953 Presence of xenogenic heart valve: Secondary | ICD-10-CM | POA: Insufficient documentation

## 2023-11-28 DIAGNOSIS — I6521 Occlusion and stenosis of right carotid artery: Secondary | ICD-10-CM

## 2023-11-28 LAB — ECHOCARDIOGRAM COMPLETE
AR max vel: 1.58 cm2
AV Area VTI: 1.67 cm2
AV Area mean vel: 1.61 cm2
AV Mean grad: 7 mmHg
AV Peak grad: 13.1 mmHg
Ao pk vel: 1.81 m/s
Area-P 1/2: 3.53 cm2
MV M vel: 2.87 m/s
MV Peak grad: 32.9 mmHg
S' Lateral: 2.28 cm

## 2023-12-05 NOTE — Telephone Encounter (Signed)
 Urgent referral to [facility] placed.

## 2023-12-10 ENCOUNTER — Ambulatory Visit: Attending: Vascular Surgery | Admitting: Vascular Surgery

## 2023-12-10 ENCOUNTER — Encounter: Payer: Self-pay | Admitting: Vascular Surgery

## 2023-12-10 VITALS — BP 135/76 | HR 89 | Temp 98.7°F | Ht 70.0 in | Wt 168.0 lb

## 2023-12-10 DIAGNOSIS — I6521 Occlusion and stenosis of right carotid artery: Secondary | ICD-10-CM | POA: Diagnosis present

## 2023-12-10 DIAGNOSIS — I6522 Occlusion and stenosis of left carotid artery: Secondary | ICD-10-CM | POA: Diagnosis not present

## 2023-12-10 NOTE — Progress Notes (Signed)
 Patient ID: Nicholas Wise, male   DOB: Sep 20, 1945, 78 y.o.   MRN: [id number]  Reason for Consult: New Patient (Initial Visit)   Referred by Wilder Handy, MD  Subjective:     HPI:  Nicholas Wise is a 78 y.o. male has a history of CABG x 3 as well as bioprosthetic aortic valve replacement.  He is sent here for evaluation of carotid disease with a history of a TIA in the past.  He has had a TIA in the past several years ago when he was still working which entailed drooping of the left side of his mouth but this was short-lived he was evaluated in the hospital and ultimately discharged.  He also has a known right carotid artery occlusion with unknown correlation to his symptoms.  He is currently on aspirin  and also a statin.  He denies any history of stroke.  He does have left hip pain which limits his walking but denies any claudication, rest pain or tissue loss.  Past Medical History:  Diagnosis Date   Allergy    Anxiety    Aortic stenosis    Aortic valve replaced    Arthritis    Blood transfusion without reported diagnosis    WITH cabg X 2 04/2021   Carotid artery stenosis    11/28/21 US : 40-59% LICA, known 100% RICA   Complication of anesthesia    bladder spasms after neck surgery. sent home with a catheter   Coronary artery disease    Depression    Heart murmur    Hemorrhoids    Hiatal hernia    High cholesterol    History of kidney stones    passed x 1   Hx of adenomatous polyp of colon 01/31/2017   Numbness and tingling in left hand    TIA (transient ischemic attack)    2015; no residual effects   Urinary frequency    Family History  Problem Relation Age of Onset   Diabetes Father    Heart disease Father    Colon cancer Neg Hx    Colon polyps Neg Hx    Esophageal cancer Neg Hx    Rectal cancer Neg Hx    Stomach cancer Neg Hx    Past Surgical History:  Procedure Laterality Date   ANTERIOR CERVICAL DECOMP/DISCECTOMY FUSION N/A 04/11/2014   Procedure: ANTERIOR  CERVICAL DECOMPRESSION/DISCECTOMY FUSION CERVICAL THREE-FOUR,CERVICAL FOUR-FIVE,CERVICAL FIVE-SIX;  Surgeon: Manya Sells, MD;  Location: [facility];  Service: Neurosurgery;  Laterality: N/A;   ANTERIOR LAT LUMBAR FUSION Left 02/20/2018   Procedure: Left Lumbar Four-Five Anterolateral lumbar interbody fusion;  Surgeon: Manya Sells, MD;  Location: [facility] OR;  Service: Neurosurgery;  Laterality: Left;   AORTIC VALVE REPLACEMENT N/A 04/27/2021   Procedure: AORTIC VALVE REPLACEMENT (AVR) USING INSPIRIS AORTIC VALVE;  Surgeon: Bartley Lightning, MD;  Location: [facility] OR;  Service: Open Heart Surgery;  Laterality: N/A;   BACK SURGERY     COLONOSCOPY     CORONARY ARTERY BYPASS GRAFT N/A 04/27/2021   Procedure: CORONARY ARTERY BYPASS GRAFTING (CABG) X  3 , USING LEFT INTERNAL MAMMARY ARTERY AND RIGHT ENDOSCOPIC GREATER SAPHENOUS VEIN CONDUITS;  Surgeon: Bartley Lightning, MD;  Location: [facility] OR;  Service: Open Heart Surgery;  Laterality: N/A;   ENDOVEIN HARVEST OF GREATER SAPHENOUS VEIN Right 04/27/2021   Procedure: ENDOVEIN HARVEST OF GREATER SAPHENOUS VEIN;  Surgeon: Bartley Lightning, MD;  Location: [facility] OR;  Service: Open Heart Surgery;  Laterality: Right;  KNEE SURGERY Right    LUMBAR LAMINECTOMY/DECOMPRESSION MICRODISCECTOMY Bilateral 03/13/2022   Procedure: Lumbar One-Two, Lumbar Two-Three Decompressive lumbar laminectomy, medial facetectomy and foraminotomies with Right Lumbar One-Two Discetomy;  Surgeon: Isadora Mar, MD;  Location: [facility] OR;  Service: Neurosurgery;  Laterality: Bilateral;   LUMBAR PERCUTANEOUS PEDICLE SCREW 1 LEVEL N/A 02/20/2018   Procedure: Lumbar Percutaneous Pedicle Screw Placment Lumbar four-five;  Surgeon: Manya Sells, MD;  Location: [facility] OR;  Service: Neurosurgery;  Laterality: N/A;   POLYPECTOMY     RIGHT HEART CATH AND CORONARY ANGIOGRAPHY N/A 03/27/2021   Procedure: RIGHT HEART CATH AND CORONARY ANGIOGRAPHY;  Surgeon: Lucendia Rusk, MD;  Location: [facility] INVASIVE CV LAB;   Service: Cardiovascular;  Laterality: N/A;   TEE WITHOUT CARDIOVERSION N/A 04/27/2021   Procedure: TRANSESOPHAGEAL ECHOCARDIOGRAM (TEE);  Surgeon: Bartley Lightning, MD;  Location: [facility] OR;  Service: Open Heart Surgery;  Laterality: N/A;   TONSILLECTOMY     TOTAL KNEE ARTHROPLASTY Right 06/08/2019   Procedure: RIGHT TOTAL KNEE ARTHROPLASTY;  Surgeon: Dayne Even, MD;  Location: [facility] ORS;  Service: Orthopedics;  Laterality: Right;   TOTAL KNEE ARTHROPLASTY Left 06/03/2023   Procedure: LEFT TOTAL KNEE ARTHROPLASTY;  Surgeon: Dayne Even, MD;  Location: [facility] ORS;  Service: Orthopedics;  Laterality: Left;   XI ROBOTIC ASSISTED INGUINAL HERNIA REPAIR WITH MESH Bilateral 11/21/2020   Procedure: ROBOTIC BILATERAL INGUINAL HERNIA REPAIR WITH MESH;  Surgeon: Shela Derby, MD;  Location: [facility] OR;  Service: General;  Laterality: Bilateral;    Short Social History:  Social History   Tobacco Use   Smoking status: Former    Types: Cigarettes   Smokeless tobacco: Never  Substance Use Topics   Alcohol  use: Not Currently    Allergies  Allergen Reactions   Bee Pollen Cough    Current Outpatient Medications  Medication Sig Dispense Refill   acetaminophen  (TYLENOL ) 500 MG tablet Take 1,000 mg by mouth in the morning and at bedtime.     allopurinol  (ZYLOPRIM ) 300 MG tablet Take 300 mg by mouth in the morning.     ALPRAZolam  (XANAX ) 0.5 MG tablet Take 0.5 mg by mouth daily as needed for anxiety.     amoxicillin (AMOXIL) 500 MG capsule      aspirin  EC 325 MG tablet Take 325 mg by mouth daily.     atorvastatin  (LIPITOR) 40 MG tablet Take 40 mg by mouth every evening.      finasteride  (PROSCAR ) 5 MG tablet Take 5 mg by mouth at bedtime.     fluorouracil (EFUDEX) 5 % cream Apply 1 application topically daily as needed (sun damage).     latanoprost (XALATAN) 0.005 % ophthalmic solution Place 1 drop into both eyes at bedtime.     montelukast  (SINGULAIR ) 10 MG tablet Take 10 mg by mouth at bedtime.      potassium citrate  (UROCIT-K ) 10 MEQ (1080 MG) SR tablet Take 10 mEq by mouth 2 (two) times daily.     senna-docusate (SENOKOT-S) 8.6-50 MG tablet Take 2 tablets by mouth at bedtime.     sertraline  (ZOLOFT ) 100 MG tablet Take 100 mg by mouth every evening.     tamsulosin  (FLOMAX ) 0.4 MG CAPS capsule Take 0.4 mg by mouth daily after breakfast.     triamcinolone  (NASACORT ) 55 MCG/ACT AERO nasal inhaler Place 1 spray into the nose in the morning.     zinc  gluconate 50 MG tablet Take 50 mg by mouth daily.     No current facility-administered medications for this visit.  Review of Systems  Constitutional:  Constitutional negative. HENT: HENT negative.  Eyes: Eyes negative.  Respiratory: Respiratory negative.  Cardiovascular: Cardiovascular negative.  GI: Gastrointestinal negative.  Musculoskeletal: Positive for gait problem, leg pain and joint pain.  Skin: Skin negative.  Hematologic: Hematologic/lymphatic negative.  Psychiatric: Psychiatric negative.        Objective:  Objective   Vitals:   12/10/23 1444  BP: 132/72  Pulse: 89  Temp: 98.7 F (37.1 C)  SpO2: 93%  Weight: 168 lb (76.2 kg)  Height: 5\' 10"  (1.778 m)   Body mass index is 24.11 kg/m.  Physical Exam HENT:     [place]: Normocephalic.     Nose: Nose normal.  Eyes:     Pupils: Pupils are equal, round, and reactive to light.  Cardiovascular:     Rate and Rhythm: Normal rate.  Pulmonary:     Effort: Pulmonary effort is normal.  Abdominal:     General: Abdomen is flat.  Musculoskeletal:     Cervical back: Normal range of motion and neck supple.     Right lower leg: No edema.     Left lower leg: No edema.  Skin:    General: Skin is warm.     Capillary Refill: Capillary refill takes less than 2 seconds.  Neurological:     General: No focal deficit present.     Mental Status: He is alert.  Psychiatric:        Mood and Affect: Mood normal.        Thought Content: Thought content normal.        Judgment:  Judgment normal.    Data: Right Carotid Findings:  +----------+--------+-------+--------+----------------------+--------------  ----+           PSV cm/sEDV    StenosisPlaque Description    Comments                               cm/s                                                      +----------+--------+-------+--------+----------------------+--------------  ----+  CCA Prox  87      9                                    intimal  thickening  +----------+--------+-------+--------+----------------------+--------------  ----+  CCA Distal59      11                                   intimal  thickening  +----------+--------+-------+--------+----------------------+--------------  ----+  ICA Prox                 Occluded                                           +----------+--------+-------+--------+----------------------+--------------  ----+  ICA Mid                  Occluded                                           +----------+--------+-------+--------+----------------------+--------------  ----+  ICA Distal               Occluded                                           +----------+--------+-------+--------+----------------------+--------------  ----+  ECA      153     11             smooth and                                                                  heterogenous                               +----------+--------+-------+--------+----------------------+--------------  ----+   +----------+--------+-------+----------------+-------------------+           PSV cm/sEDV cmsDescribe        Arm Pressure (mmHG)  +----------+--------+-------+----------------+-------------------+  [id number]            Multiphasic, [id number]                  +----------+--------+-------+----------------+-------------------+   +---------+--------+--+--------+--+---------+  VertebralPSV cm/s65EDV cm/s11Antegrade   +---------+--------+--+--------+--+---------+      Left Carotid Findings:  +----------+--------+--------+--------+--------------------------+--------+            PSV cm/sEDV cm/sStenosisPlaque Description         Comments  +----------+--------+--------+--------+--------------------------+--------+   CCA Prox  112     25                                                   +----------+--------+--------+--------+--------------------------+--------+   CCA Distal80      21              smooth and heterogenous              +----------+--------+--------+--------+--------------------------+--------+   ICA Prox  405     119     80-99%  heterogenous and irregular           +----------+--------+--------+--------+--------------------------+--------+   ICA Mid   173     37                                                   +----------+--------+--------+--------+--------------------------+--------+   ICA Distal130     29                                                   +----------+--------+--------+--------+--------------------------+--------+   ECA      117     3               heterogenous and irregular           +----------+--------+--------+--------+--------------------------+--------+    +----------+--------+--------+----------------+-------------------+  PSV cm/sEDV cm/sDescribe        Arm Pressure (mmHG)  +----------+--------+--------+----------------+-------------------+  Subclavian103    0       Multiphasic, [id number]                  +----------+--------+--------+----------------+-------------------+   +---------+--------+--+--------+--+---------+  VertebralPSV cm/s68EDV cm/s16Antegrade  +---------+--------+--+--------+--+---------+         Summary:  Right Carotid: Evidence consistent with a total occlusion of the right  ICA.   Left Carotid: Velocities in the left ICA are consistent with a  low-range  80-99%               stenosis. Stenosis has progressed since prior exam.   Vertebrals:  Bilateral vertebral arteries demonstrate antegrade flow.  Subclavians: Normal flow hemodynamics were seen in bilateral subclavian               arteries.      Assessment/Plan:     78 year old male with known occlusion of his right carotid artery and now high-grade stenosis on the left.  I discussed with the patient that using duplex a carotid occlusion can alter the velocities on the contralateral side and he may not have high-grade stenosis.  We discussed the signs and symptoms of stroke and he demonstrates very good understanding.  We have discussed proceeding with CT angio of the [place] and neck for further evaluation and he agrees to proceed.  Follow-up will include discussion of proceeding with best medical therapy versus stenting versus carotid endarterectomy based upon the findings.  All questions were answered and he and his wife demonstrate good understanding.     Adine Hoof MD [facility]

## 2023-12-15 ENCOUNTER — Other Ambulatory Visit: Payer: Self-pay

## 2023-12-15 DIAGNOSIS — I6523 Occlusion and stenosis of bilateral carotid arteries: Secondary | ICD-10-CM

## 2023-12-30 ENCOUNTER — Ambulatory Visit
Admission: RE | Admit: 2023-12-30 | Discharge: 2023-12-30 | Disposition: A | Discharge status: Home / Self Care | Source: Ambulatory Visit | Attending: Vascular Surgery

## 2023-12-30 ENCOUNTER — Other Ambulatory Visit: Payer: Self-pay | Admitting: Vascular Surgery

## 2023-12-30 DIAGNOSIS — I6523 Occlusion and stenosis of bilateral carotid arteries: Secondary | ICD-10-CM

## 2023-12-30 MED ORDER — IOPAMIDOL (ISOVUE-370) INJECTION 76%
75.0000 mL | Freq: Once | INTRAVENOUS | Status: AC | PRN
  Administered 2023-12-30: 75 mL via INTRAVENOUS

## 2024-01-07 ENCOUNTER — Ambulatory Visit: Attending: Vascular Surgery | Admitting: Vascular Surgery

## 2024-01-07 ENCOUNTER — Encounter: Payer: Self-pay | Admitting: Vascular Surgery

## 2024-01-07 VITALS — BP 126/75 | HR 70 | Temp 98.0°F | Ht 70.0 in | Wt 170.0 lb

## 2024-01-07 DIAGNOSIS — I6522 Occlusion and stenosis of left carotid artery: Secondary | ICD-10-CM | POA: Insufficient documentation

## 2024-01-07 DIAGNOSIS — I6521 Occlusion and stenosis of right carotid artery: Secondary | ICD-10-CM | POA: Diagnosis present

## 2024-01-07 NOTE — Progress Notes (Signed)
 Patient ID: Nicholas Wise, male   DOB: Nov 21, 1945, 78 y.o.   MRN: [id number]  Reason for Consult: Follow-up   Referred by Nancee Deward BROCKS, MD  Subjective:     HPI:  Nicholas Wise is a 78 y.o. 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  He has had no other neurologic symptoms.  On most recent evaluation he was noted to have high-grade stenosis of the left carotid artery.  He remains very active and taking aspirin  and a statin.  Most of his activity is limited by hip pain but continues to deny claudication, rest pain or tissue loss.  Past Medical History:  Diagnosis Date   Allergy    Anxiety    Aortic stenosis    Aortic valve replaced    Arthritis    Blood transfusion without reported diagnosis    WITH cabg X 2 04/2021   Carotid artery stenosis    11/28/21 US : 40-59% LICA, known 100% RICA   Complication of anesthesia    bladder spasms after neck surgery. sent home with a catheter   Coronary artery disease    Depression    Heart murmur    Hemorrhoids    Hiatal hernia    High cholesterol    History of kidney stones    passed x 1   Hx of adenomatous polyp of colon 01/31/2017   Numbness and tingling in left hand    TIA (transient ischemic attack)    2015; no residual effects   Urinary frequency    Family History  Problem Relation Age of Onset   Diabetes Father    Heart disease Father    Colon cancer Neg Hx    Colon polyps Neg Hx    Esophageal cancer Neg Hx    Rectal cancer Neg Hx    Stomach cancer Neg Hx    Past Surgical History:  Procedure Laterality Date   ANTERIOR CERVICAL DECOMP/DISCECTOMY FUSION N/A 04/11/2014   Procedure: ANTERIOR CERVICAL DECOMPRESSION/DISCECTOMY FUSION CERVICAL THREE-FOUR,CERVICAL FOUR-FIVE,CERVICAL FIVE-SIX;  Surgeon: Fairy Levels, MD;  Location: [facility];  Service: Neurosurgery;  Laterality: N/A;   ANTERIOR LAT LUMBAR FUSION Left 02/20/2018   Procedure: Left Lumbar  Four-Five Anterolateral lumbar interbody fusion;  Surgeon: Levels Fairy, MD;  Location: [facility] OR;  Service: Neurosurgery;  Laterality: Left;   AORTIC VALVE REPLACEMENT N/A 04/27/2021   Procedure: AORTIC VALVE REPLACEMENT (AVR) USING INSPIRIS AORTIC VALVE;  Surgeon: Lucas Dorise POUR, MD;  Location: [facility] OR;  Service: Open Heart Surgery;  Laterality: N/A;   BACK SURGERY     COLONOSCOPY     CORONARY ARTERY BYPASS GRAFT N/A 04/27/2021   Procedure: CORONARY ARTERY BYPASS GRAFTING (CABG) X  3 , USING LEFT INTERNAL MAMMARY ARTERY AND RIGHT ENDOSCOPIC GREATER SAPHENOUS VEIN CONDUITS;  Surgeon: Lucas Dorise POUR, MD;  Location: [facility] OR;  Service: Open Heart Surgery;  Laterality: N/A;   ENDOVEIN HARVEST OF GREATER SAPHENOUS VEIN Right 04/27/2021   Procedure: ENDOVEIN HARVEST OF GREATER SAPHENOUS VEIN;  Surgeon: Lucas Dorise POUR, MD;  Location: [facility] OR;  Service: Open Heart Surgery;  Laterality: Right;   KNEE SURGERY Right    LUMBAR LAMINECTOMY/DECOMPRESSION MICRODISCECTOMY Bilateral 03/13/2022   Procedure: Lumbar One-Two, Lumbar Two-Three Decompressive lumbar laminectomy, medial facetectomy and foraminotomies with Right Lumbar One-Two Discetomy;  Surgeon: Joshua Alm RAMAN, MD;  Location: [facility] OR;  Service: Neurosurgery;  Laterality: Bilateral;   LUMBAR PERCUTANEOUS PEDICLE SCREW 1  LEVEL N/A 02/20/2018   Procedure: Lumbar Percutaneous Pedicle Screw Placment Lumbar four-five;  Surgeon: Unice Pac, MD;  Location: [facility] OR;  Service: Neurosurgery;  Laterality: N/A;   POLYPECTOMY     RIGHT HEART CATH AND CORONARY ANGIOGRAPHY N/A 03/27/2021   Procedure: RIGHT HEART CATH AND CORONARY ANGIOGRAPHY;  Surgeon: Dann Candyce RAMAN, MD;  Location: [facility] INVASIVE CV LAB;  Service: Cardiovascular;  Laterality: N/A;   TEE WITHOUT CARDIOVERSION N/A 04/27/2021   Procedure: TRANSESOPHAGEAL ECHOCARDIOGRAM (TEE);  Surgeon: Lucas Dorise POUR, MD;  Location: [facility] OR;  Service: Open Heart Surgery;  Laterality: N/A;   TONSILLECTOMY     TOTAL KNEE  ARTHROPLASTY Right 06/08/2019   Procedure: RIGHT TOTAL KNEE ARTHROPLASTY;  Surgeon: Sheril Coy, MD;  Location: [facility] ORS;  Service: Orthopedics;  Laterality: Right;   TOTAL KNEE ARTHROPLASTY Left 06/03/2023   Procedure: LEFT TOTAL KNEE ARTHROPLASTY;  Surgeon: Sheril Coy, MD;  Location: [facility] ORS;  Service: Orthopedics;  Laterality: Left;   XI ROBOTIC ASSISTED INGUINAL HERNIA REPAIR WITH MESH Bilateral 11/21/2020   Procedure: ROBOTIC BILATERAL INGUINAL HERNIA REPAIR WITH MESH;  Surgeon: Rubin Calamity, MD;  Location: [facility] OR;  Service: General;  Laterality: Bilateral;    Short Social History:  Social History   Tobacco Use   Smoking status: Former    Types: Cigarettes   Smokeless tobacco: Never  Substance Use Topics   Alcohol  use: Not Currently    Allergies  Allergen Reactions   Bee Pollen Cough    Current Outpatient Medications  Medication Sig Dispense Refill   acetaminophen  (TYLENOL ) 500 MG tablet Take 1,000 mg by mouth in the morning and at bedtime.     allopurinol  (ZYLOPRIM ) 300 MG tablet Take 300 mg by mouth in the morning.     ALPRAZolam  (XANAX ) 0.5 MG tablet Take 0.5 mg by mouth daily as needed for anxiety.     amoxicillin (AMOXIL) 500 MG capsule      aspirin  EC 325 MG tablet Take 325 mg by mouth daily.     atorvastatin  (LIPITOR) 40 MG tablet Take 40 mg by mouth every evening.      finasteride  (PROSCAR ) 5 MG tablet Take 5 mg by mouth at bedtime.     fluorouracil (EFUDEX) 5 % cream Apply 1 application topically daily as needed (sun damage).     latanoprost (XALATAN) 0.005 % ophthalmic solution Place 1 drop into both eyes at bedtime.     montelukast  (SINGULAIR ) 10 MG tablet Take 10 mg by mouth at bedtime.     potassium citrate  (UROCIT-K ) 10 MEQ (1080 MG) SR tablet Take 10 mEq by mouth 2 (two) times daily.     senna-docusate (SENOKOT-S) 8.6-50 MG tablet Take 2 tablets by mouth at bedtime.     sertraline  (ZOLOFT ) 100 MG tablet Take 100 mg by mouth every evening.      tamsulosin  (FLOMAX ) 0.4 MG CAPS capsule Take 0.4 mg by mouth daily after breakfast.     triamcinolone  (NASACORT ) 55 MCG/ACT AERO nasal inhaler Place 1 spray into the nose in the morning.     zinc  gluconate 50 MG tablet Take 50 mg by mouth daily.     No current facility-administered medications for this visit.    Review of Systems  Constitutional:  Constitutional negative. HENT: HENT negative.  Eyes: Eyes negative.  Respiratory: Respiratory negative.  Cardiovascular: Cardiovascular negative.  GI: Gastrointestinal negative.  Musculoskeletal: Positive for gait problem and joint pain.  Hematologic: Hematologic/lymphatic negative.  Psychiatric: Psychiatric negative.  Objective:  Objective   Vitals:   01/07/24 0904 01/07/24 0906  BP: 125/70 126/75  Pulse: 70   Temp: 98 F (36.7 C)   SpO2: 92%   Weight: 170 lb (77.1 kg)   Height: 5' 10 (1.778 m)    Body mass index is 24.39 kg/m.  Physical Exam HENT:     [place]: Normocephalic.   Eyes:     Pupils: Pupils are equal, round, and reactive to light.    Cardiovascular:     Rate and Rhythm: Normal rate.     Pulses: Normal pulses.  Pulmonary:     Effort: Pulmonary effort is normal.  Abdominal:     General: Abdomen is flat.   Musculoskeletal:        General: Normal range of motion.     Right lower leg: No edema.     Left lower leg: No edema.   Skin:    General: Skin is warm.     Capillary Refill: Capillary refill takes less than 2 seconds.   Neurological:     General: No focal deficit present.     Mental Status: He is alert. Mental status is at baseline.   Psychiatric:        Mood and Affect: Mood normal.        Thought Content: Thought content normal.        Judgment: Judgment normal.     Data: CT results have not been read by radiology but were reviewed with patient today appears to be approximately 40% stenosis of the ICA bifurcation     Assessment/Plan:     78 year old male with known history of  right carotid artery occlusion most recent duplex demonstrated high-grade stenosis which was an increase from the previous ultrasound 1 year prior with velocities that had more than doubled.  CT scan does not demonstrate any high-grade stenosis and likely was ultrasound misinterpretation due to the contralateral occlusion.  We will use that as his new baseline.  I will follow-up the formal read of the CTA and if there are changes I will reach out to the patient otherwise he will follow-up in 1 year with repeat carotid duplex.  We again discussed the signs and symptoms of stroke and he demonstrates good understanding and will continue aspirin  and statin.     Penne Lonni Colorado MD [facility]
